# Patient Record
Sex: Female | Born: 1975
Health system: Southern US, Community
[De-identification: ages and names within clinical notes are randomized; demographics above are authoritative.]

## PROBLEM LIST (undated history)

## (undated) DIAGNOSIS — R7401 Elevation of levels of liver transaminase levels: Secondary | ICD-10-CM

## (undated) DIAGNOSIS — F17200 Nicotine dependence, unspecified, uncomplicated: Secondary | ICD-10-CM

## (undated) DIAGNOSIS — I10 Essential (primary) hypertension: Secondary | ICD-10-CM

## (undated) DIAGNOSIS — R7301 Impaired fasting glucose: Secondary | ICD-10-CM

## (undated) DIAGNOSIS — F419 Anxiety disorder, unspecified: Secondary | ICD-10-CM

## (undated) DIAGNOSIS — S93402A Sprain of unspecified ligament of left ankle, initial encounter: Secondary | ICD-10-CM

## (undated) DIAGNOSIS — G621 Alcoholic polyneuropathy: Secondary | ICD-10-CM

## (undated) DIAGNOSIS — R569 Unspecified convulsions: Secondary | ICD-10-CM

## (undated) DIAGNOSIS — F10939 Alcohol use, unspecified with withdrawal, unspecified: Secondary | ICD-10-CM

## (undated) DIAGNOSIS — F1020 Alcohol dependence, uncomplicated: Secondary | ICD-10-CM

## (undated) DIAGNOSIS — R740 Nonspecific elevation of levels of transaminase and lactic acid dehydrogenase [LDH]: Secondary | ICD-10-CM

## (undated) DIAGNOSIS — E519 Thiamine deficiency, unspecified: Secondary | ICD-10-CM

## (undated) DIAGNOSIS — E612 Magnesium deficiency: Secondary | ICD-10-CM

## (undated) HISTORY — DX: Anxiety disorder, unspecified: F41.9

## (undated) HISTORY — DX: Nicotine dependence, unspecified, uncomplicated: F17.200

## (undated) HISTORY — DX: Nonspecific elevation of levels of transaminase and lactic acid dehydrogenase (ldh): R74.0

## (undated) HISTORY — DX: Unspecified convulsions: R56.9

## (undated) HISTORY — DX: Alcohol use, unspecified with withdrawal, unspecified: F10.939

## (undated) HISTORY — DX: Essential (primary) hypertension: I10

## (undated) HISTORY — DX: Thiamine deficiency, unspecified: E51.9

## (undated) HISTORY — DX: Elevation of levels of liver transaminase levels: R74.01

## (undated) HISTORY — DX: Alcoholic polyneuropathy: G62.1

## (undated) HISTORY — PX: CATARACT EXTRACTION W/ INTRAOCULAR LENS  IMPLANT, BILATERAL: SHX1307

## (undated) HISTORY — DX: Magnesium deficiency: E61.2

## (undated) HISTORY — DX: Alcohol dependence, uncomplicated: F10.20

## (undated) HISTORY — PX: WISDOM TOOTH EXTRACTION: SHX21

## (undated) HISTORY — DX: Sprain of unspecified ligament of left ankle, initial encounter: S93.402A

## (undated) HISTORY — PX: CERVICAL CONE BIOPSY: SUR198

## (undated) HISTORY — DX: Impaired fasting glucose: R73.01

---

## 1999-11-01 ENCOUNTER — Other Ambulatory Visit: Admission: RE | Admit: 1999-11-01 | Discharge: 1999-11-01 | Payer: Self-pay | Admitting: Family Medicine

## 1999-12-03 ENCOUNTER — Other Ambulatory Visit: Admission: RE | Admit: 1999-12-03 | Discharge: 1999-12-03 | Payer: Self-pay | Admitting: Obstetrics and Gynecology

## 1999-12-03 ENCOUNTER — Encounter (INDEPENDENT_AMBULATORY_CARE_PROVIDER_SITE_OTHER): Payer: Self-pay | Admitting: Specialist

## 2000-04-28 ENCOUNTER — Other Ambulatory Visit: Admission: RE | Admit: 2000-04-28 | Discharge: 2000-04-28 | Payer: Self-pay | Admitting: Obstetrics and Gynecology

## 2000-12-23 ENCOUNTER — Other Ambulatory Visit: Admission: RE | Admit: 2000-12-23 | Discharge: 2000-12-23 | Payer: Self-pay | Admitting: Obstetrics and Gynecology

## 2001-11-11 ENCOUNTER — Other Ambulatory Visit: Admission: RE | Admit: 2001-11-11 | Discharge: 2001-11-11 | Payer: Self-pay | Admitting: Family Medicine

## 2002-08-19 ENCOUNTER — Encounter: Payer: Self-pay | Admitting: Internal Medicine

## 2002-08-19 ENCOUNTER — Encounter: Admission: RE | Admit: 2002-08-19 | Discharge: 2002-08-19 | Payer: Self-pay | Admitting: Internal Medicine

## 2002-09-28 ENCOUNTER — Encounter: Admission: RE | Admit: 2002-09-28 | Discharge: 2002-09-28 | Payer: Self-pay | Admitting: Internal Medicine

## 2002-09-28 ENCOUNTER — Encounter: Payer: Self-pay | Admitting: Internal Medicine

## 2003-06-17 ENCOUNTER — Inpatient Hospital Stay (HOSPITAL_COMMUNITY): Admission: AD | Admit: 2003-06-17 | Discharge: 2003-06-19 | Payer: Self-pay | Admitting: Obstetrics and Gynecology

## 2003-06-23 ENCOUNTER — Encounter: Admission: RE | Admit: 2003-06-23 | Discharge: 2003-07-23 | Payer: Self-pay | Admitting: Obstetrics and Gynecology

## 2003-08-02 ENCOUNTER — Other Ambulatory Visit: Admission: RE | Admit: 2003-08-02 | Discharge: 2003-08-02 | Payer: Self-pay | Admitting: Obstetrics and Gynecology

## 2004-08-14 ENCOUNTER — Other Ambulatory Visit: Admission: RE | Admit: 2004-08-14 | Discharge: 2004-08-14 | Payer: Self-pay | Admitting: Obstetrics and Gynecology

## 2004-12-03 ENCOUNTER — Ambulatory Visit: Payer: Self-pay | Admitting: Family Medicine

## 2005-02-14 ENCOUNTER — Ambulatory Visit: Payer: Self-pay | Admitting: Family Medicine

## 2005-02-28 ENCOUNTER — Ambulatory Visit: Payer: Self-pay

## 2005-03-04 ENCOUNTER — Encounter: Admission: RE | Admit: 2005-03-04 | Discharge: 2005-03-04 | Payer: Self-pay | Admitting: Obstetrics and Gynecology

## 2005-08-30 ENCOUNTER — Ambulatory Visit: Payer: Self-pay | Admitting: Internal Medicine

## 2005-10-30 ENCOUNTER — Ambulatory Visit: Payer: Self-pay | Admitting: Internal Medicine

## 2005-12-09 ENCOUNTER — Other Ambulatory Visit: Admission: RE | Admit: 2005-12-09 | Discharge: 2005-12-09 | Payer: Self-pay | Admitting: Obstetrics and Gynecology

## 2006-03-21 ENCOUNTER — Ambulatory Visit: Payer: Self-pay | Admitting: Family Medicine

## 2006-04-14 ENCOUNTER — Ambulatory Visit: Payer: Self-pay | Admitting: Family Medicine

## 2006-05-06 ENCOUNTER — Ambulatory Visit: Payer: Self-pay | Admitting: Internal Medicine

## 2006-05-20 ENCOUNTER — Ambulatory Visit: Payer: Self-pay | Admitting: Internal Medicine

## 2006-08-29 ENCOUNTER — Ambulatory Visit: Payer: Self-pay | Admitting: Internal Medicine

## 2006-08-29 LAB — CONVERTED CEMR LAB
BUN: 9 mg/dL (ref 6–23)
Creatinine, Ser: 0.8 mg/dL (ref 0.4–1.2)
Potassium: 4 meq/L (ref 3.5–5.1)

## 2008-02-16 ENCOUNTER — Telehealth (INDEPENDENT_AMBULATORY_CARE_PROVIDER_SITE_OTHER): Payer: Self-pay | Admitting: *Deleted

## 2008-05-19 ENCOUNTER — Telehealth (INDEPENDENT_AMBULATORY_CARE_PROVIDER_SITE_OTHER): Payer: Self-pay | Admitting: *Deleted

## 2008-06-28 ENCOUNTER — Ambulatory Visit: Payer: Self-pay | Admitting: Family Medicine

## 2008-06-28 DIAGNOSIS — J209 Acute bronchitis, unspecified: Secondary | ICD-10-CM | POA: Insufficient documentation

## 2008-06-29 ENCOUNTER — Telehealth (INDEPENDENT_AMBULATORY_CARE_PROVIDER_SITE_OTHER): Payer: Self-pay | Admitting: *Deleted

## 2008-07-18 ENCOUNTER — Telehealth (INDEPENDENT_AMBULATORY_CARE_PROVIDER_SITE_OTHER): Payer: Self-pay | Admitting: *Deleted

## 2008-07-20 ENCOUNTER — Ambulatory Visit: Payer: Self-pay | Admitting: Internal Medicine

## 2009-05-25 ENCOUNTER — Inpatient Hospital Stay (HOSPITAL_COMMUNITY): Admission: RE | Admit: 2009-05-25 | Discharge: 2009-05-26 | Payer: Self-pay | Admitting: Specialist

## 2009-05-30 ENCOUNTER — Inpatient Hospital Stay (HOSPITAL_COMMUNITY): Admission: AD | Admit: 2009-05-30 | Discharge: 2009-06-02 | Payer: Self-pay | Admitting: Obstetrics and Gynecology

## 2009-05-31 ENCOUNTER — Ambulatory Visit: Payer: Self-pay | Admitting: Obstetrics and Gynecology

## 2009-05-31 ENCOUNTER — Encounter (INDEPENDENT_AMBULATORY_CARE_PROVIDER_SITE_OTHER): Payer: Self-pay | Admitting: Obstetrics and Gynecology

## 2010-03-08 ENCOUNTER — Ambulatory Visit: Payer: Self-pay | Admitting: Family Medicine

## 2010-03-08 DIAGNOSIS — F17200 Nicotine dependence, unspecified, uncomplicated: Secondary | ICD-10-CM | POA: Insufficient documentation

## 2010-03-08 DIAGNOSIS — I10 Essential (primary) hypertension: Secondary | ICD-10-CM | POA: Insufficient documentation

## 2010-03-08 DIAGNOSIS — F411 Generalized anxiety disorder: Secondary | ICD-10-CM | POA: Insufficient documentation

## 2010-03-08 DIAGNOSIS — K589 Irritable bowel syndrome without diarrhea: Secondary | ICD-10-CM | POA: Insufficient documentation

## 2010-03-09 LAB — CONVERTED CEMR LAB
BUN: 15 mg/dL (ref 6–23)
Basophils Absolute: 0 10*3/uL (ref 0.0–0.1)
Basophils Relative: 0.3 % (ref 0.0–3.0)
CO2: 28 meq/L (ref 19–32)
Calcium: 9.2 mg/dL (ref 8.4–10.5)
Chloride: 99 meq/L (ref 96–112)
Creatinine, Ser: 0.7 mg/dL (ref 0.4–1.2)
Eosinophils Absolute: 0.2 10*3/uL (ref 0.0–0.7)
Eosinophils Relative: 1.4 % (ref 0.0–5.0)
GFR calc non Af Amer: 96.99 mL/min (ref >60)
Glucose, Bld: 88 mg/dL (ref 70–99)
HCT: 42 % (ref 36.0–46.0)
Hemoglobin: 14.3 g/dL (ref 12.0–15.0)
Lymphocytes Relative: 23 % (ref 12.0–46.0)
Lymphs Abs: 2.5 10*3/uL (ref 0.7–4.0)
MCHC: 34.1 g/dL (ref 30.0–36.0)
MCV: 97.5 fL (ref 78.0–100.0)
Monocytes Absolute: 0.6 10*3/uL (ref 0.1–1.0)
Monocytes Relative: 5.4 % (ref 3.0–12.0)
Neutro Abs: 7.7 10*3/uL (ref 1.4–7.7)
Neutrophils Relative %: 69.9 % (ref 43.0–77.0)
Platelets: 277 10*3/uL (ref 150.0–400.0)
Potassium: 4 meq/L (ref 3.5–5.1)
RBC: 4.31 M/uL (ref 3.87–5.11)
RDW: 12.7 % (ref 11.5–14.6)
Sodium: 136 meq/L (ref 135–145)
TSH: 0.91 microintl units/mL (ref 0.35–5.50)
WBC: 11 10*3/uL — ABNORMAL HIGH (ref 4.5–10.5)

## 2010-03-16 ENCOUNTER — Telehealth: Payer: Self-pay | Admitting: Family Medicine

## 2010-03-20 ENCOUNTER — Ambulatory Visit: Payer: Self-pay | Admitting: Family Medicine

## 2010-03-20 DIAGNOSIS — J310 Chronic rhinitis: Secondary | ICD-10-CM | POA: Insufficient documentation

## 2010-04-20 ENCOUNTER — Telehealth (INDEPENDENT_AMBULATORY_CARE_PROVIDER_SITE_OTHER): Payer: Self-pay | Admitting: *Deleted

## 2010-05-09 ENCOUNTER — Telehealth (INDEPENDENT_AMBULATORY_CARE_PROVIDER_SITE_OTHER): Payer: Self-pay | Admitting: *Deleted

## 2010-11-04 ENCOUNTER — Encounter: Payer: Self-pay | Admitting: Family Medicine

## 2010-11-15 NOTE — Assessment & Plan Note (Signed)
Summary: follow up on BP/drb   Vital Signs:  Patient profile:   35 year old female Weight:      132 pounds Pulse rate:   78 / minute BP sitting:   130 / 84  (left arm)  Vitals Entered By: Michelle Myers (March 20, 2010 9:26 AM) CC: bp f/u    History of Present Illness: 35 yo woman here today for  1) BP- much improved.  had mother-in-law's home health nurse check her BP over the weekend and BP was normal.  pt reports on 2 episodes would have racing heart, chest heaviness, shortness of breath- these improved spontaneously.  hx of panic attacks in the past.    2) Afrin addiction- pt reports she started using during pregnancy and has been using regularly for 'months'.  now 'unable to breathe w/out it'.  doesn't know what to do.  Allergies (verified): No Known Drug Allergies  Past History:  Past Medical History: HTN anxiety  Review of Systems      See HPI  Physical Exam  General:  Well-developed,well-nourished,in no acute distress; alert,appropriate and cooperative throughout examination.  anxious Nose:  + congestion Lungs:  Normal respiratory effort, chest expands symmetrically. Lungs are clear to auscultation, no crackles or wheezes. Heart:  regular S1/S2 Pulses:  +2 carotid, radial, DP/PT Extremities:  no C/C/E   Impression & Recommendations:  Problem # 1:  HYPERTENSION (ICD-401.9) Assessment Improved BP better controlled than last visit.  has renal US scheduled for tomorrow.  advised pt to keep appt despite better control of BP.  given pt's chest heaviness and palpitations did EKG- normal.  most likely a panic attack.  reviewed red flags that should prompt return. Her updated medication list for this problem includes:    Lisinopril-hydrochlorothiazide 20-12.5 Mg Tabs (Lisinopril-hydrochlorothiazide) .Marland Kitchen... 1 tablet by mouth daily  Orders: EKG w/ Interpretation (93000)  Problem # 2:  ANXIETY STATE, UNSPECIFIED (ICD-300.00) Assessment: Unchanged pt has had 2 episodes  of chest heaviness that sound consistent w/ panic attacks.  pt reports hx of this and 'i just deal w/ it'.  will follow. Her updated medication list for this problem includes:    Sertraline Hcl 100 Mg Tabs (Sertraline hcl) .Marland Kitchen... Take one tablet daily  Problem # 3:  RHINITIS MEDICAMENTOSA (ICD-472.0) Assessment: New stop afrin.  start steroid nasal spray.  Zyrtec daily.  try and avoid sudafed due to impact on BP.  Complete Medication List: 1)  Hydrocodone-acetaminophen 5-500 Mg Tabs (Hydrocodone-acetaminophen) .Marland Kitchen.. 1-2 tablets every 4 to 6 hours as needed 2)  Sertraline Hcl 100 Mg Tabs (Sertraline hcl) .... Take one tablet daily 3)  Camila 0.35 Mg Tabs (Norethindrone) .... Take one tablet daily 4)  Lisinopril-hydrochlorothiazide 20-12.5 Mg Tabs (Lisinopril-hydrochlorothiazide) .Marland Kitchen.. 1 tablet by mouth daily 5)  Bentyl 20 Mg Tabs (Dicyclomine hcl) .Marland Kitchen.. 1 tab by mouth three times a day as needed for abdominal spasm 6)  Nasonex 50 Mcg/act Susp (Mometasone furoate) .... 2 sprays each nostril once daily  Patient Instructions: 1)  Please schedule a complete physical at your convenience in the next 1-2 months.  Do not eat before this appt 2)  Continue the Lisinopril/HCTZ 3)  STOP the Afrin 4)  START the Nasonex- 2 sprays each nostril daily 5)  Start Zyrtec (over the counter) daily for allergies 6)  Limit the use of sudafed for only severe congestion b/c this will increase your blood pressure 7)  Hang in there! Prescriptions: NASONEX 50 MCG/ACT SUSP (MOMETASONE FUROATE) 2 sprays each nostril  once daily  #1 x 3   Entered and Authorized by:   Michelle Rhymes MD   Signed by:   Michelle Rhymes MD on 03/20/2010   Method used:   [facility] to        [facility]  [address] [phone]* (retail)       [address]       [place], [place]  [address]       Ph: [phone] or [phone]       Fax: [phone]   RxID:   [id number]

## 2010-11-15 NOTE — Assessment & Plan Note (Signed)
Summary: check bp & ibs/cbs   Vital Signs:  Patient profile:   35 year old female Weight:      129 pounds Pulse rate:   102 / minute BP sitting:   180 / 100  (left arm)  Vitals Entered By: Doristine Devoid (Mar 08, 2010 11:12 AM) CC: bp elevated and IBS concerns since being pregnant   History of Present Illness: 35 yo woman here today for  1) Elevated BP- first dx'd in college, strong family hx (parents and brother).  BP worsened after birth of 2nd baby.  has never had renal [place].  having associated lethargy, some feelings of shakiness.  has not had recent thyroid labs.  on methyldopa and HCTZ but pt can't remember to take methyldopa three times a day.  denies CP, SOB, HAs, visual changes, edema.  + tobacco.  2) IBS- diarrhea predominant.  every morning w/in 40 minutes of waking up has loose stools.  will have intermittant issues during the day.  some relief w/ immodium.  diet is not high in fiber.  having occasional abdominal spasms.  3) Anxiety- youngest daughter possibly has neurofibromatosis, admits to anxiety.  not on currently on Effexor, was previously.  now on Sertraline.  Preventive Screening-Counseling & Management  Alcohol-Tobacco     Smoking Status: current     Smoking Cessation Counseling: yes     Smoke Cessation Stage: ready     Packs/Day: 0.25  Caffeine-Diet-Exercise     Does Patient Exercise: yes      Sexual History:  currently monogamous.        Drug Use:  never.    Current Medications (verified): 1)  Hydrocodone-Acetaminophen 5-500 Mg Tabs (Hydrocodone-Acetaminophen) .Marland Kitchen.. 1-2 Tablets Every 4 To 6 Hours As Needed 2)  Methyldopa 250 Mg Tabs (Methyldopa) .... Take One Tablet Three Times A Day 3)  Hydrochlorothiazide 50 Mg Tabs (Hydrochlorothiazide) .... Take One Tablet Daily 4)  Sertraline Hcl 100 Mg Tabs (Sertraline Hcl) .... Take One Tablet Daily 5)  Camila 0.35 Mg Tabs (Norethindrone) .... Take One Tablet Daily 6)  Lisinopril-Hydrochlorothiazide 20-12.5 Mg  Tabs (Lisinopril-Hydrochlorothiazide) .Marland Kitchen.. 1 Tablet By Mouth Daily 7)  Bentyl 20 Mg Tabs (Dicyclomine Hcl) .Marland Kitchen.. 1 Tab By Mouth Three Times A Day As Needed For Abdominal Spasm  Allergies (verified): No Known Drug Allergies  Past History:  Past Medical History: HTN  Family History: HTN- mother, father, brother  Social History: Married Current Smoker 2 daughters- youngest w/ ? neurofibromatosis Packs/Day:  0.25 Does Patient Exercise:  yes Sexual History:  currently monogamous Drug Use:  never  Review of Systems      See HPI  Physical Exam  General:  Well-developed,well-nourished,in no acute distress; alert,appropriate and cooperative throughout examination.  anxious Head:  Normocephalic and atraumatic without obvious abnormalities. No apparent alopecia or balding. Eyes:  PERRL, EOMI Neck:  No deformities, masses, or tenderness noted. Lungs:  Normal respiratory effort, chest expands symmetrically. Lungs are clear to auscultation, no crackles or wheezes. Heart:  regular S1/S2, tachy Pulses:  +2 carotid, radial, DP/PT Extremities:  no C/C/E Psych:  anxious, intermittantly tearful   Impression & Recommendations:  Problem # 1:  HYPERTENSION (ICD-401.9) Assessment New pt's BP very high.  given young age at dx will get renal US.  stop Methyldopa and HCTZ and start ACE/HCTZ combo.  reviewed red flags that should prompt immediate attention.  Pt expresses understanding and is in agreement w/ this plan. The following medications were removed from the medication list:    Diltzac  360 Mg Xr24h-cap (Diltiazem hcl er beads) .Marland Kitchen... Take 1 capsule once daily    Methyldopa 250 Mg Tabs (Methyldopa) .Marland Kitchen... Take one tablet three times a day    Hydrochlorothiazide 50 Mg Tabs (Hydrochlorothiazide) .Marland Kitchen... Take one tablet daily Her updated medication list for this problem includes:    Lisinopril-hydrochlorothiazide 20-12.5 Mg Tabs (Lisinopril-hydrochlorothiazide) .Marland Kitchen... 1 tablet by mouth  daily  Orders: Venipuncture (16109) TLB-BMP (Basic Metabolic Panel-BMET) (80048-METABOL) TLB-CBC Platelet - w/Differential (85025-CBCD) TLB-TSH (Thyroid Stimulating Hormone) (60454-UJW) Radiology Referral (Radiology)  Problem # 2:  IBS (ICD-564.1) Assessment: New pt's sxs consistent w/ IBS.  reviewed lifestyle modifications- increased fiber, water, exercise.  bentyl as needed.  Problem # 3:  ANXIETY STATE, UNSPECIFIED (ICD-300.00) Assessment: New advised pt to try and find stress outlet- suggested exercise- as this will improve BP.  will discuss possibility of starting therapy at next visit. The following medications were removed from the medication list:    Effexor Xr 150 Mg Xr24h-cap (Venlafaxine hcl) .Marland Kitchen... Take 1 tab once daily Her updated medication list for this problem includes:    Sertraline Hcl 100 Mg Tabs (Sertraline hcl) .Marland Kitchen... Take one tablet daily  Problem # 4:  TOBACCO USE (ICD-305.1) Assessment: New encouraged pt to quit smoking as this will improve anxiety and HTN.  pt going to try.  Complete Medication List: 1)  Hydrocodone-acetaminophen 5-500 Mg Tabs (Hydrocodone-acetaminophen) .Marland Kitchen.. 1-2 tablets every 4 to 6 hours as needed 2)  Sertraline Hcl 100 Mg Tabs (Sertraline hcl) .... Take one tablet daily 3)  Camila 0.35 Mg Tabs (Norethindrone) .... Take one tablet daily 4)  Lisinopril-hydrochlorothiazide 20-12.5 Mg Tabs (Lisinopril-hydrochlorothiazide) .Marland Kitchen.. 1 tablet by mouth daily 5)  Bentyl 20 Mg Tabs (Dicyclomine hcl) .Marland Kitchen.. 1 tab by mouth three times a day as needed for abdominal spasm  Patient Instructions: 1)  Please schedule follow up in 2-3 weeks to recheck your blood pressure 2)  STOP the methyldopa and hydrochlorothiazide 3)  START the lisinopril-HCTZ combo pill- 1 daily 4)  Try and stay calm- anxiety will increase your blood pressure 5)  Limit the amount of salt in your diet 6)  STOP SMOKING!! 7)  Someone will call you with the renal ultrasound appt 8)  Use the  Bentyl as needed for abdominal spasm 9)  Increase the amount of fiber in your diet- either in the form of fruits and vegetables or in fiber supplements 10)  If you develop chest pain, shortness of breath, severe headaches, visual changes or other concerns- please go to the ER 11)  Hang in there!! Prescriptions: BENTYL 20 MG TABS (DICYCLOMINE HCL) 1 tab by mouth three times a day as needed for abdominal spasm  #60 x 1   Entered and Authorized by:   Neena Rhymes MD   Signed by:   Neena Rhymes MD on 03/08/2010   Method used:   [facility]        [facility]  [address] [phone]* (retail)       [address] [place], [place]  [address]       Ph: [phone] or [phone]       Fax: [phone]   RxID:   [id number] LISINOPRIL-HYDROCHLOROTHIAZIDE 20-12.5 MG TABS (LISINOPRIL-HYDROCHLOROTHIAZIDE) 1 tablet by mouth daily  #30 x 3   Entered and Authorized by:   Neena Rhymes MD   Signed by:   Neena Rhymes MD on 03/08/2010   Method used:   [facility]  [facility]  [address] [phone][facility] (retail)       [address]       [place], [place]  [address]       Ph: [phone] or [phone]       Fax: [phone]   RxID:   [id number]

## 2010-11-15 NOTE — Progress Notes (Signed)
Summary: BP meds/weight gain  Phone Note Call from Patient   Caller: Patient Summary of Call: Pt called and stated that since starting her new BP medication she has gained 8 pounds. She states that she is eating the same and excersing the same. Pt denies any other symptoms or changes. Please advise, pt would like to go back to old medication or change to a different medication. Army Fossa CMA  March 16, 2010 2:00 PM    Follow-up for Phone Call        weight gain is not a side effect of Lisinopril HCTZ (or I can't find any data on this).  up to 5 lbs in water weight fluctuations is within normal limits.  given how high her blood pressure was and the fact that her old medicine wasn't working she needs to continue on this at least through the weekend and can schedule an appt for early next week.  please ask her what her BP has been running. Follow-up by: Neena Rhymes MD,  March 16, 2010 2:13 PM  Additional Follow-up for Phone Call Additional follow up Details #1::        Pt has not checked BP, will check. She has an appt on Tuesday. She will continue meds through the weekend. Army Fossa CMA  March 16, 2010 2:20 PM

## 2010-11-15 NOTE — Progress Notes (Signed)
Summary: hydrocodone refill   Phone Note Refill Request Message from:  Fax from Pharmacy on April 20, 2010 9:44 AM  Refills Requested: Medication #1:  HYDROCODONE-ACETAMINOPHEN 5-500 MG TABS 1-2 tablets every 4 to 6 hours as needed [facility] [address] [facility] [place] - fax [phone] - tel [phone]  Initial call taken by: Okey Regal [place],  April 20, 2010 9:45 AM  Follow-up for Phone Call        called patient to verify what she is taking medication for says she was given this for HA's ok to fill?.......Marland KitchenDoristine Devoid  April 20, 2010 4:01 PM   Additional Follow-up for Phone Call Additional follow up Details #1::        ok for #20, no refills Additional Follow-up by: Neena Rhymes MD,  April 20, 2010 4:27 PM    Prescriptions: HYDROCODONE-ACETAMINOPHEN 5-500 MG TABS (HYDROCODONE-ACETAMINOPHEN) 1-2 tablets every 4 to 6 hours as needed  #20 x 0   Entered by:   Doristine Devoid   Authorized by:   Neena Rhymes MD   Signed by:   Doristine Devoid on 04/20/2010   Method used:   Printed then faxed to ...       [facility]  [address] [phone][facility] ([facility]ail)       [address]       [place], [place]  [address]       Ph: [phone] or [phone]       Fax: [phone]   RxID:   [id number]

## 2010-11-15 NOTE — Progress Notes (Signed)
Summary: hydrocodone refill denied   Phone Note Refill Request Message from:  Pharmacy on May 09, 2010 10:28 AM  Refills Requested: Medication #1:  HYDROCODONE-ACETAMINOPHEN 5-500 MG TABS 1-2 tablets every 4 to 6 hours as needed   Dosage confirmed as above?Dosage Confirmed   Supply Requested: 1 month   Last Refilled: 04/20/[facility] [address]  Next Appointment Scheduled: August 2nd 2011 Initial call taken by: Lavell Islam,  May 09, 2010 10:29 AM  Follow-up for Phone Call        she should not be taking these regularly.  only as needed for severe headaches.  no refill at this time.  if HAs are severe enough to require 20 vicodin monthly she will need an OV Follow-up by: Neena Rhymes MD,  May 09, 2010 3:56 PM  Additional Follow-up for Phone Call Additional follow up Details #1::        spoke w/ patient aware of instructions and has appt scheduled for cpx next week and says she will address this issue at that time........Marland KitchenDoristine Devoid CMA  May 09, 2010 4:08 PM

## 2011-01-19 LAB — COMPREHENSIVE METABOLIC PANEL
ALT: 11 U/L (ref 0–35)
AST: 22 U/L (ref 0–37)
Alkaline Phosphatase: 96 U/L (ref 39–117)
CO2: 21 mEq/L (ref 19–32)
Chloride: 106 mEq/L (ref 96–112)
Creatinine, Ser: 0.5 mg/dL (ref 0.4–1.2)
GFR calc Af Amer: >60 mL/min (ref >60)
GFR calc non Af Amer: >60 mL/min (ref >60)
Potassium: 3.6 mEq/L (ref 3.5–5.1)
Sodium: 134 mEq/L — ABNORMAL LOW (ref 135–145)
Total Bilirubin: 0.4 mg/dL (ref 0.3–1.2)

## 2011-01-19 LAB — CBC
HCT: 34 % — ABNORMAL LOW (ref 36.0–46.0)
Hemoglobin: 11.9 g/dL — ABNORMAL LOW (ref 12.0–15.0)
MCHC: 34.5 g/dL (ref 30.0–36.0)
MCHC: 35 g/dL (ref 30.0–36.0)
MCV: 96.9 fL (ref 78.0–100.0)
MCV: 98.1 fL (ref 78.0–100.0)
Platelets: 184 10*3/uL (ref 150–400)
Platelets: 242 10*3/uL (ref 150–400)
RBC: 3.5 MIL/uL — ABNORMAL LOW (ref 3.87–5.11)
RDW: 12.2 % (ref 11.5–15.5)
RDW: 12.3 % (ref 11.5–15.5)
WBC: 13.4 10*3/uL — ABNORMAL HIGH (ref 4.0–10.5)

## 2011-10-28 ENCOUNTER — Other Ambulatory Visit: Payer: Self-pay | Admitting: Family Medicine

## 2011-10-28 ENCOUNTER — Encounter (INDEPENDENT_AMBULATORY_CARE_PROVIDER_SITE_OTHER): Payer: BC Managed Care – PPO | Admitting: Cardiology

## 2011-10-28 ENCOUNTER — Ambulatory Visit (INDEPENDENT_AMBULATORY_CARE_PROVIDER_SITE_OTHER): Payer: BC Managed Care – PPO | Admitting: Family Medicine

## 2011-10-28 ENCOUNTER — Other Ambulatory Visit: Payer: Self-pay | Admitting: Cardiology

## 2011-10-28 ENCOUNTER — Encounter: Payer: Self-pay | Admitting: Family Medicine

## 2011-10-28 VITALS — BP 152/92 | HR 85 | Temp 98.9°F | Wt 151.0 lb

## 2011-10-28 DIAGNOSIS — M79609 Pain in unspecified limb: Secondary | ICD-10-CM

## 2011-10-28 DIAGNOSIS — Z720 Tobacco use: Secondary | ICD-10-CM

## 2011-10-28 DIAGNOSIS — F17200 Nicotine dependence, unspecified, uncomplicated: Secondary | ICD-10-CM

## 2011-10-28 DIAGNOSIS — M79669 Pain in unspecified lower leg: Secondary | ICD-10-CM

## 2011-10-28 DIAGNOSIS — I10 Essential (primary) hypertension: Secondary | ICD-10-CM

## 2011-10-28 DIAGNOSIS — R229 Localized swelling, mass and lump, unspecified: Secondary | ICD-10-CM

## 2011-10-28 DIAGNOSIS — M7989 Other specified soft tissue disorders: Secondary | ICD-10-CM

## 2011-10-28 DIAGNOSIS — M7120 Synovial cyst of popliteal space [Baker], unspecified knee: Secondary | ICD-10-CM

## 2011-10-28 MED ORDER — LISINOPRIL-HYDROCHLOROTHIAZIDE 20-12.5 MG PO TABS: 2.0000 | ORAL_TABLET | Freq: Every day | ORAL | Status: DC

## 2011-10-28 MED ORDER — VARENICLINE TARTRATE 0.5 MG X 11 & 1 MG X 42 PO MISC: ORAL | Status: AC

## 2011-10-28 NOTE — Patient Instructions (Signed)
Smoking Cessation This document explains the best ways for you to quit smoking and new treatments to help. It lists new medicines that can double or triple your chances of quitting and quitting for good. It also considers ways to avoid relapses and concerns you may have about quitting, including weight gain. NICOTINE: A POWERFUL ADDICTION If you have tried to quit smoking, you know how hard it can be. It is hard because nicotine is a very addictive drug. For some people, it can be as addictive as heroin or cocaine. Usually, people make 2 or 3 tries, or more, before finally being able to quit. Each time you try to quit, you can learn about what helps and what hurts. Quitting takes hard work and a lot of effort, but you can quit smoking. QUITTING SMOKING IS ONE OF THE MOST IMPORTANT THINGS YOU WILL EVER DO.  You will live longer, feel better, and live better.   The impact on your body of quitting smoking is felt almost immediately:   Within 20 minutes, blood pressure decreases. Pulse returns to its normal level.   After 8 hours, carbon monoxide levels in the blood return to normal. Oxygen level increases.   After 24 hours, chance of heart attack starts to decrease. Breath, hair, and body stop smelling like smoke.   After 48 hours, damaged nerve endings begin to recover. Sense of taste and smell improve.   After 72 hours, the body is virtually free of nicotine. Bronchial tubes relax and breathing becomes easier.   After 2 to 12 weeks, lungs can hold more air. Exercise becomes easier and circulation improves.   Quitting will reduce your risk of having a heart attack, stroke, cancer, or lung disease:   After 1 year, the risk of coronary heart disease is cut in half.   After 5 years, the risk of stroke falls to the same as a nonsmoker.   After 10 years, the risk of lung cancer is cut in half and the risk of other cancers decreases significantly.   After 15 years, the risk of coronary heart  disease drops, usually to the level of a nonsmoker.   If you are pregnant, quitting smoking will improve your chances of having a healthy baby.   The people you live with, especially your children, will be healthier.   You will have extra money to spend on things other than cigarettes.  FIVE KEYS TO QUITTING Studies have shown that these 5 steps will help you quit smoking and quit for good. You have the best chances of quitting if you use them together: 1. Get ready.  2. Get support and encouragement.  3. Learn new skills and behaviors.  4. Get medicine to reduce your nicotine addiction and use it correctly.  5. Be prepared for relapse or difficult situations. Be determined to continue trying to quit, even if you do not succeed at first.  1. GET READY  Set a quit date.   Change your environment.   Get rid of ALL cigarettes, ashtrays, matches, and lighters in your home, car, and place of work.   Do not let people smoke in your home.   Review your past attempts to quit. Think about what worked and what did not.   Once you quit, do not smoke. NOT EVEN A PUFF!  2. GET SUPPORT AND ENCOURAGEMENT Studies have shown that you have a better chance of being successful if you have help. You can get support in many ways.  Tell   your family, friends, and coworkers that you are going to quit and need their support. Ask them not to smoke around you.   Talk to your caregivers (doctor, dentist, nurse, pharmacist, psychologist, and/or smoking counselor).   Get individual, group, or telephone counseling and support. The more counseling you have, the better your chances are of quitting. Programs are available at local hospitals and health centers. Call your local health department for information about programs in your area.   Spiritual beliefs and practices may help some smokers quit.   Quit meters are small computer programs online or downloadable that keep track of quit statistics, such as amount  of "quit-time," cigarettes not smoked, and money saved.   Many smokers find one or more of the many self-help books available useful in helping them quit and stay off tobacco.  3. LEARN NEW SKILLS AND BEHAVIORS  Try to distract yourself from urges to smoke. Talk to someone, go for a walk, or occupy your time with a task.   When you first try to quit, change your routine. Take a different route to work. Drink tea instead of coffee. Eat breakfast in a different place.   Do something to reduce your stress. Take a hot bath, exercise, or read a book.   Plan something enjoyable to do every day. Reward yourself for not smoking.   Explore interactive web-based programs that specialize in helping you quit.  4. GET MEDICINE AND USE IT CORRECTLY Medicines can help you stop smoking and decrease the urge to smoke. Combining medicine with the above behavioral methods and support can quadruple your chances of successfully quitting smoking. The [place] Food and Drug Administration (FDA) has approved 7 medicines to help you quit smoking. These medicines fall into 3 categories.  Nicotine replacement therapy (delivers nicotine to your body without the negative effects and risks of smoking):   Nicotine gum: Available over-the-counter.   Nicotine lozenges: Available over-the-counter.   Nicotine inhaler: Available by prescription.   Nicotine nasal spray: Available by prescription.   Nicotine skin patches (transdermal): Available by prescription and over-the-counter.   Antidepressant medicine (helps people abstain from smoking, but how this works is unknown):   Bupropion sustained-release (SR) tablets: Available by prescription.   Nicotinic receptor partial agonist (simulates the effect of nicotine in your brain):   Varenicline tartrate tablets: Available by prescription.   Ask your caregiver for advice about which medicines to use and how to use them. Carefully read the information on the package.    Everyone who is trying to quit may benefit from using a medicine. If you are pregnant or trying to become pregnant, nursing an infant, you are under age 18, or you smoke fewer than 10 cigarettes per day, talk to your caregiver before taking any nicotine replacement medicines.   You should stop using a nicotine replacement product and call your caregiver if you experience nausea, dizziness, weakness, vomiting, fast or irregular heartbeat, mouth problems with the lozenge or gum, or redness or swelling of the skin around the patch that does not go away.   Do not use any other product containing nicotine while using a nicotine replacement product.   Talk to your caregiver before using these products if you have diabetes, heart disease, asthma, stomach ulcers, you had a recent heart attack, you have high blood pressure that is not controlled with medicine, a history of irregular heartbeat, or you have been prescribed medicine to help you quit smoking.  5. BE PREPARED FOR RELAPSE OR   DIFFICULT SITUATIONS  Most relapses occur within the first 3 months after quitting. Do not be discouraged if you start smoking again. Remember, most people try several times before they finally quit.   You may have symptoms of withdrawal because your body is used to nicotine. You may crave cigarettes, be irritable, feel very hungry, cough often, get headaches, or have difficulty concentrating.   The withdrawal symptoms are only temporary. They are strongest when you first quit, but they will go away within 10 to 14 days.  Here are some difficult situations to watch for:  Alcohol. Avoid drinking alcohol. Drinking lowers your chances of successfully quitting.   Caffeine. Try to reduce the amount of caffeine you consume. It also lowers your chances of successfully quitting.   Other smokers. Being around smoking can make you want to smoke. Avoid smokers.   Weight gain. Many smokers will gain weight when they quit, usually  less than 10 pounds. Eat a healthy diet and stay active. Do not let weight gain distract you from your main goal, quitting smoking. Some medicines that help you quit smoking may also help delay weight gain. You can always lose the weight gained after you quit.   Bad mood or depression. There are a lot of ways to improve your mood other than smoking.  If you are having problems with any of these situations, talk to your caregiver. SPECIAL SITUATIONS AND CONDITIONS Studies suggest that everyone can quit smoking. Your situation or condition can give you a special reason to quit.  Pregnant women/new mothers: By quitting, you protect your baby's health and your own.   Hospitalized patients: By quitting, you reduce health problems and help healing.   Heart attack patients: By quitting, you reduce your risk of a second heart attack.   Lung, head, and neck cancer patients: By quitting, you reduce your chance of a second cancer.   Parents of children and adolescents: By quitting, you protect your children from illnesses caused by secondhand smoke.  QUESTIONS TO THINK ABOUT Think about the following questions before you try to stop smoking. You may want to talk about your answers with your caregiver.  Why do you want to quit?   If you tried to quit in the past, what helped and what did not?   What will be the most difficult situations for you after you quit? How will you plan to handle them?   Who can help you through the tough times? Your family? Friends? Caregiver?   What pleasures do you get from smoking? What ways can you still get pleasure if you quit?  Here are some questions to ask your caregiver:  How can you help me to be successful at quitting?   What medicine do you think would be best for me and how should I take it?   What should I do if I need more help?   What is smoking withdrawal like? How can I get information on withdrawal?  Quitting takes hard work and a lot of effort,  but you can quit smoking. FOR MORE INFORMATION  Smokefree.gov (http://www.smokefree.gov) provides free, accurate, evidence-based information and professional assistance to help support the immediate and long-term needs of people trying to quit smoking. Document Released: 09/24/2001 Document Revised: 06/12/2011 Document Reviewed: 07/17/2009 [employer] Patient Information 2012 [employer]. 

## 2011-10-28 NOTE — Progress Notes (Signed)
  Subjective:    Patient ID: Michelle Myers, female    DOB: 06/10/1976, 36 y.o.   MRN: [id number]  HPI Pt here after not being seen for a long time.  She c/o calf pain and a know behind knee cap for several days.   No sob or chest pain.   Review of Systems    as above Objective:   Physical Exam  Constitutional: She is oriented to person, place, and time. She appears well-developed and well-nourished.  Pulmonary/Chest: Effort normal and breath sounds normal.  Musculoskeletal: She exhibits tenderness. She exhibits no edema.       L calf tenderness with palpable mass behind knee No errythema or swelling  Neurological: She is alert and oriented to person, place, and time.  Psychiatric: She has a normal mood and affect. Her behavior is normal. Judgment and thought content normal.          Assessment & Plan:  Calf pain---  Doppler today----  More likely bakers cyst Tob-- chantix starter pack htn--increase lisinopril 20/12.5   2 po qd                 rtp 2-3 weeks

## 2011-12-11 ENCOUNTER — Telehealth: Payer: Self-pay | Admitting: *Deleted

## 2011-12-11 NOTE — Telephone Encounter (Signed)
Call a nurse called to advise the pt has experienced rapid heart beat and lightheadedness for 20 minutes however she is no longer having these sxs, advised pt that we can see her however if her sxs she needs to go to the office, pt requested MD Alwyn Ren, advised he will be out of the office til march 11th, pt accepted apt via call a nurse karen, for MD Tabori at 9:30am 12-11-11, pt was also instructed to have someone else drive her.pt understood all instructions

## 2011-12-11 NOTE — Telephone Encounter (Signed)
Triage Call Report Triage Record Num: [id number] Operator: Patriciaann Clan Patient Name: Michelle Myers Call Date & Time: 12/11/2011 4:39:06PM Patient Phone: [phone] PCP: Marga Melnick Patient Gender: Female PCP Fax : [phone] Patient DOB: June 10, 1976 Practice Name: [facility] Reason for Call: Caller: Tara/Patient; PCP: Marga Melnick; CB#: [phone]; ; ; Call regarding Rapid Heart Rate, lightheaded. LMP 11/25/11. Patient states she developed sudden onset of rapid heart rate, feeling lightheaded. Onset 12/11/11 1100. States episode occurred while sitting at rest. Denies nausea, chest pain, shortness of breath or diaphoresis. States episode lasted approx. 20 minutes. Afebrile. States had similar episode approx. 10 days ago. Patient states she is asymptomatic at present. Triage per Irregular Heartbeat Protocol. Care advice given per guidelines. Patient advised to increase fluids, avoid caffiene. Call back parameters reviewed. Patient verbalizes understanding. RN spoke with Selena Batten RN in office regarding ED Disposition. Orders received: Patient to be seen in office 12/12/11. Patient to go to ED if sx recur. Patient informed of above, verbalizes understanding. Call transferred to Chrae in office for appt. 12/12/11. Protocol(s) Used: Irregular Heartbeat Recommended Outcome per Protocol: See ED Immediately Override Outcome if Used in Protocol: See Provider within 24 hours RN Reason for Override Outcome: Physician Orders. Reason for Outcome: Sudden onset of rapid pulse (greater than 120 beats/minute at rest) OR slow pulse (less than 50 beats per minute at rest) Care Advice: ~ Another adult should drive. 02/

## 2011-12-12 ENCOUNTER — Ambulatory Visit: Payer: BC Managed Care – PPO | Admitting: Family Medicine

## 2012-02-27 ENCOUNTER — Other Ambulatory Visit: Payer: Self-pay | Admitting: Family Medicine

## 2012-09-30 ENCOUNTER — Other Ambulatory Visit: Payer: Self-pay | Admitting: Obstetrics and Gynecology

## 2013-07-28 ENCOUNTER — Ambulatory Visit (INDEPENDENT_AMBULATORY_CARE_PROVIDER_SITE_OTHER): Payer: BC Managed Care – PPO | Admitting: Internal Medicine

## 2013-07-28 ENCOUNTER — Telehealth: Payer: Self-pay | Admitting: *Deleted

## 2013-07-28 ENCOUNTER — Encounter: Payer: Self-pay | Admitting: Internal Medicine

## 2013-07-28 VITALS — BP 145/89 | HR 86 | Temp 98.8°F | Resp 12 | Wt 144.6 lb

## 2013-07-28 DIAGNOSIS — R200 Anesthesia of skin: Secondary | ICD-10-CM

## 2013-07-28 DIAGNOSIS — R209 Unspecified disturbances of skin sensation: Secondary | ICD-10-CM

## 2013-07-28 DIAGNOSIS — F17210 Nicotine dependence, cigarettes, uncomplicated: Secondary | ICD-10-CM

## 2013-07-28 DIAGNOSIS — F17200 Nicotine dependence, unspecified, uncomplicated: Secondary | ICD-10-CM

## 2013-07-28 DIAGNOSIS — R202 Paresthesia of skin: Secondary | ICD-10-CM

## 2013-07-28 DIAGNOSIS — I10 Essential (primary) hypertension: Secondary | ICD-10-CM

## 2013-07-28 DIAGNOSIS — I7300 Raynaud's syndrome without gangrene: Secondary | ICD-10-CM

## 2013-07-28 LAB — VITAMIN B12: Vitamin B-12: 306 pg/mL (ref 211–911)

## 2013-07-28 MED ORDER — VARENICLINE TARTRATE 0.5 MG PO TABS: 0.5000 mg | ORAL_TABLET | Freq: Two times a day (BID) | ORAL | Status: DC

## 2013-07-28 NOTE — Progress Notes (Signed)
  Subjective:    Patient ID: Michelle Myers, female    DOB: [birth year], 37 y.o.   MRN: [id number]  HPI  For the last 4 months she's noted numbness in the balls of the feet and distally involving all toes of both feet. This only occurs with exercise such as using her elliptical machine.  She has had chronic tingling as "pins and needles" involving her hands and feet for several years.  There's been no treatment or evaluation    Review of Systems  She denies constitutional symptoms of fever, chills, sweats, weight loss.  She also denies limb weakness or incontinence of urine or stool.  Last letter she noted redness of both forearms and hands without specific trigger. This could occur indoors  Chantix to aid smoking cessation requested     Objective:   Physical Exam Appears healthy and well-nourished & in no acute distress  No carotid bruits are present.No neck pain distention present at 10 - 15 degrees. Thyroid normal to palpation  Heart rhythm and rate are normal with no significant murmurs or gallops.  Chest is clear with no increased work of breathing. Decreased BS  There is no evidence of aortic aneurysm or renal artery bruits  Abdomen soft with no organomegaly or masses. No HJR  No clubbing, cyanosis or edema present.  Pedal pulses are intact   No ischemic skin changes are present . Nails healthy    Alert and oriented. Strength, tone, DTRs reflexes normal. Light touch normal over feet & hands          Assessment & Plan:  #1 numbness of the feet from the balls to the distal toes; this occurred exercises most likely related to compression of the digital nerves. This may be related to her footwear  #2 chronic tingling in extremities; B12 and TSH levels recommended  #3 active smoker with desire to quit; Chantix will be prescribed if not contraindicated with a sertraline #4 Raynaud's

## 2013-07-28 NOTE — Patient Instructions (Signed)
Protect  hands, feet, and ears from cold exposure including ice chests. Use silk sock & mitten liners; this can be purchased at outdoor supply stores. Minimal Blood Pressure Goal= AVERAGE < 140/90;  Ideal is an AVERAGE < 135/85. This AVERAGE should be calculated from @ least 5-7 BP readings taken @ different times of day on different days of week. You should not respond to isolated BP readings , but rather the AVERAGE for that week .Please bring your  blood pressure cuff to office visits to verify that it is reliable.It  can also be checked against the blood pressure device at the pharmacy. Finger or wrist cuffs are not dependable; an arm cuff is.  If you activate the  My Chart system; lab & Xray results will be released directly  to you as soon as I review & address these through the computer. If you choose not to sign up for My Chart within 36 hours of labs being drawn; results will be reviewed & interpretation added before being copied & mailed, causing a delay in getting the results to you.If you do not receive that report within 7-10 days ,please call. Additionally you can use this system to gain direct  access to your records  if  out of town or @ an office of a  physician who is not in  the My Chart network.  This improves continuity of care & places you in control of your medical record.  I recommend a Sports Medicine consultation to determine optimal therapy; I recommend Dr Ayesha Mohair

## 2013-07-28 NOTE — Telephone Encounter (Signed)
Samples of Chantix given to patient during visit today. Lot # [id number], Exp 08/2015

## 2013-07-29 ENCOUNTER — Encounter: Payer: Self-pay | Admitting: *Deleted

## 2013-07-29 NOTE — Progress Notes (Signed)
Letter mailed to patient.

## 2013-09-28 ENCOUNTER — Telehealth: Payer: Self-pay

## 2013-09-28 NOTE — Telephone Encounter (Addendum)
Left message for call back identifiable  Medication and allergies:  Reviewed and updated  90 day supply/mail order:  Local pharmacy:    Immunizations due:  Unsure when had last tdap  A/P:   No changes to personal hx or PSH; updated FH Pap--09/2012--Dr Grewal Flu vaccine at work  To Discuss with Provider: Father recently dx with prostate cancer Youngest daughter recently dx with brain tumor; praying to be benign

## 2013-09-29 ENCOUNTER — Ambulatory Visit (INDEPENDENT_AMBULATORY_CARE_PROVIDER_SITE_OTHER): Payer: BC Managed Care – PPO | Admitting: Internal Medicine

## 2013-09-29 ENCOUNTER — Encounter: Payer: Self-pay | Admitting: Internal Medicine

## 2013-09-29 VITALS — BP 125/83 | HR 85 | Temp 98.5°F | Resp 13 | Ht 65.75 in | Wt 145.6 lb

## 2013-09-29 DIAGNOSIS — J310 Chronic rhinitis: Secondary | ICD-10-CM

## 2013-09-29 DIAGNOSIS — Z23 Encounter for immunization: Secondary | ICD-10-CM

## 2013-09-29 DIAGNOSIS — Z0000 Encounter for general adult medical examination without abnormal findings: Secondary | ICD-10-CM

## 2013-09-29 DIAGNOSIS — F17200 Nicotine dependence, unspecified, uncomplicated: Secondary | ICD-10-CM

## 2013-09-29 DIAGNOSIS — F411 Generalized anxiety disorder: Secondary | ICD-10-CM

## 2013-09-29 NOTE — Progress Notes (Signed)
Pre visit review using our clinic review tool, if applicable. No additional management support is needed unless otherwise documented below in the visit note. 

## 2013-09-29 NOTE — Progress Notes (Signed)
   Subjective:    Patient ID: Michelle Myers, female    DOB: 12/16/1975, 37 y.o.   MRN: [id number]  HPI  She is here for a physical;acute issues denied but ongoing neurotransmitter issues.     Review of Systems  She feels that Wellbutrin has been the most effective agent she's used for neurotransmitter deficiency. This had to be discontinued because of pregnancy. She is presently on a BCP& plans no additional pregnancies. Her sertraline does not have any significant impact on her procrastination and anxiety. She is drinking 1.5 bottles of wine & smoking 1.5 ppd.    Objective:   Physical Exam  Gen.: Healthy and well-nourished in appearance. Alert, appropriate and cooperative throughout exam. Appears younger than stated age  Head: Normocephalic without obvious abnormalities  Eyes: No corneal or conjunctival inflammation noted. Pupils equal round reactive to light and accommodation. Extraocular motion intact.  Ears: External  ear exam reveals no significant lesions or deformities. Canals clear .TMs normal.  Nose: External nasal exam reveals no deformity or inflammation. Nasal mucosa are pink and moist. No lesions or exudates noted.   Mouth: Oral mucosa and oropharynx reveal no lesions or exudates. Teeth in good repair. Neck: No deformities, masses, or tenderness noted.  Thyroid normal. Lungs: Normal respiratory effort; chest expands symmetrically. Low grade basilar  rales, wheezes, & rhonchi w/o increased work of breathing. Heart: Normal rate and rhythm. Normal S1 and S2. No gallop, click, or rub. S4 with slurring at  LSB Abdomen: Bowel sounds normal; abdomen soft and nontender. No masses, organomegaly or hernias noted. Genitalia:  as per Gyn                                  Musculoskeletal/extremities: No deformity or scoliosis noted of  the thoracic or lumbar spine.   No clubbing, cyanosis, edema, or significant extremity  deformity noted. Range of motion normal .Tone & strength  normal. Hand joints normal . Fingernail  health good. Able to lie down & sit up w/o help. Negative SLR bilaterally Vascular: Carotid, radial artery, dorsalis pedis and  posterior tibial pulses are full and equal. No bruits present. Neurologic: Alert and oriented x3. Deep tendon reflexes symmetrical and normal.        Skin: Intact without suspicious lesions or rashes. Lymph: No cervical, axillary lymphadenopathy present. Psych: Mood and affect are normal. Normally interactive                                                                                        Assessment & Plan:  #1 comprehensive physical exam; no acute findings #2 anxiety disorder & nicotine & alcohol excess  Plan: see Orders  & Recommendations

## 2013-09-29 NOTE — Patient Instructions (Signed)
Plain Mucinex (NOT D) for thick secretions ;force NON dairy fluids .   Nasal cleansing in the shower as discussed with lather of mild shampoo.After 10 seconds wash off lather while  exhaling through nostrils. Make sure that all residual soap is removed to prevent irritation.  Nasacort AQ OTC 1 spray in each nostril twice a day as needed. Use the "crossover" technique into opposite nostril spraying toward opposite ear @ 45 degree angle, not straight up into nostril.  Use a Neti pot daily only  as needed for significant sinus congestion; going from open side to congested side . Plain Allegra (NOT D )  160 daily , Loratidine 10 mg , OR Zyrtec 10 mg @ bedtime  as needed for itchy eyes & sneezing.   Please discuss with Dr Vincente Poli changing Sertraline back to Wellbutrin to raise the Dopamine  neurotransmitter which is essential for good brain function, both intellectual & emotional health. This agents is not addictive and simply keeps this essential neurotransmitter at therapeutic levels. If these levels become severely depleted; ADD & addictive behavior can present.

## 2013-09-29 NOTE — Addendum Note (Signed)
Addended by: Verdie Shire on: 09/29/2013 03:07 PM   Modules accepted: Orders

## 2013-10-08 ENCOUNTER — Other Ambulatory Visit: Payer: BC Managed Care – PPO

## 2013-10-08 ENCOUNTER — Other Ambulatory Visit (INDEPENDENT_AMBULATORY_CARE_PROVIDER_SITE_OTHER): Payer: BC Managed Care – PPO

## 2013-10-08 ENCOUNTER — Encounter: Payer: Self-pay | Admitting: *Deleted

## 2013-10-08 DIAGNOSIS — Z0000 Encounter for general adult medical examination without abnormal findings: Secondary | ICD-10-CM

## 2013-10-08 LAB — CBC WITH DIFFERENTIAL/PLATELET
Basophils Absolute: 0.1 10*3/uL (ref 0.0–0.1)
Eosinophils Relative: 3.5 % (ref 0.0–5.0)
Monocytes Absolute: 0.7 10*3/uL (ref 0.1–1.0)
Monocytes Relative: 6.5 % (ref 3.0–12.0)
Neutrophils Relative %: 69.4 % (ref 43.0–77.0)
Platelets: 284 10*3/uL (ref 150.0–400.0)
RDW: 12.7 % (ref 11.5–14.6)
WBC: 10.9 10*3/uL — ABNORMAL HIGH (ref 4.5–10.5)

## 2013-10-08 LAB — LIPID PANEL
Cholesterol: 169 mg/dL (ref 0–200)
LDL Cholesterol: 93 mg/dL (ref 0–99)
Total CHOL/HDL Ratio: 3
Triglycerides: 52 mg/dL (ref 0.0–149.0)
VLDL: 10.4 mg/dL (ref 0.0–40.0)

## 2013-10-08 LAB — HEPATIC FUNCTION PANEL
ALT: 17 U/L (ref 0–35)
AST: 23 U/L (ref 0–37)
Bilirubin, Direct: 0.1 mg/dL (ref 0.0–0.3)
Total Bilirubin: 0.4 mg/dL (ref 0.3–1.2)

## 2013-10-08 LAB — BASIC METABOLIC PANEL
BUN: 10 mg/dL (ref 6–23)
Creatinine, Ser: 0.5 mg/dL (ref 0.4–1.2)
GFR: 150.55 mL/min (ref >60.00)
Glucose, Bld: 86 mg/dL (ref 70–99)

## 2013-11-08 ENCOUNTER — Other Ambulatory Visit: Payer: Self-pay | Admitting: Obstetrics and Gynecology

## 2013-12-27 ENCOUNTER — Encounter (HOSPITAL_BASED_OUTPATIENT_CLINIC_OR_DEPARTMENT_OTHER): Payer: Self-pay | Admitting: Emergency Medicine

## 2013-12-27 ENCOUNTER — Telehealth: Payer: Self-pay | Admitting: *Deleted

## 2013-12-27 ENCOUNTER — Emergency Department (HOSPITAL_BASED_OUTPATIENT_CLINIC_OR_DEPARTMENT_OTHER): Payer: BC Managed Care – PPO

## 2013-12-27 ENCOUNTER — Emergency Department (HOSPITAL_BASED_OUTPATIENT_CLINIC_OR_DEPARTMENT_OTHER)
Admission: EM | Admit: 2013-12-27 | Discharge: 2013-12-27 | Disposition: A | Discharge status: Home / Self Care | Payer: BC Managed Care – PPO | Attending: Emergency Medicine | Admitting: Emergency Medicine

## 2013-12-27 DIAGNOSIS — I10 Essential (primary) hypertension: Secondary | ICD-10-CM | POA: Insufficient documentation

## 2013-12-27 DIAGNOSIS — R079 Chest pain, unspecified: Secondary | ICD-10-CM | POA: Insufficient documentation

## 2013-12-27 DIAGNOSIS — F17200 Nicotine dependence, unspecified, uncomplicated: Secondary | ICD-10-CM | POA: Insufficient documentation

## 2013-12-27 DIAGNOSIS — Z79899 Other long term (current) drug therapy: Secondary | ICD-10-CM | POA: Insufficient documentation

## 2013-12-27 LAB — BASIC METABOLIC PANEL WITH GFR
BUN: 23 mg/dL (ref 6–23)
CO2: 27 meq/L (ref 19–32)
Calcium: 9.8 mg/dL (ref 8.4–10.5)
Chloride: 97 meq/L (ref 96–112)
Creatinine, Ser: 0.7 mg/dL (ref 0.50–1.10)
GFR calc Af Amer: >90 mL/min
GFR calc non Af Amer: >90 mL/min
Glucose, Bld: 111 mg/dL — ABNORMAL HIGH (ref 70–99)
Potassium: 3.8 meq/L (ref 3.7–5.3)
Sodium: 136 meq/L — ABNORMAL LOW (ref 137–147)

## 2013-12-27 LAB — D-DIMER, QUANTITATIVE (NOT AT ARMC): D-Dimer, Quant: 0.27 ug/mL-FEU (ref 0.00–0.48)

## 2013-12-27 LAB — CBC WITH DIFFERENTIAL/PLATELET
Basophils Absolute: 0 K/uL (ref 0.0–0.1)
Basophils Relative: 0 % (ref 0–1)
Eosinophils Absolute: 0.3 K/uL (ref 0.0–0.7)
Eosinophils Relative: 3 % (ref 0–5)
HCT: 39 % (ref 36.0–46.0)
Hemoglobin: 13.2 g/dL (ref 12.0–15.0)
Lymphocytes Relative: 32 % (ref 12–46)
Lymphs Abs: 3.3 K/uL (ref 0.7–4.0)
MCH: 32.6 pg (ref 26.0–34.0)
MCHC: 33.8 g/dL (ref 30.0–36.0)
MCV: 96.3 fL (ref 78.0–100.0)
Monocytes Absolute: 1 K/uL (ref 0.1–1.0)
Monocytes Relative: 9 % (ref 3–12)
Neutro Abs: 5.8 K/uL (ref 1.7–7.7)
Neutrophils Relative %: 56 % (ref 43–77)
Platelets: 296 K/uL (ref 150–400)
RBC: 4.05 MIL/uL (ref 3.87–5.11)
RDW: 11.8 % (ref 11.5–15.5)
WBC: 10.4 K/uL (ref 4.0–10.5)

## 2013-12-27 LAB — TROPONIN I: Troponin I: <0.3 ng/mL (ref <0.30)

## 2013-12-27 MED ORDER — ASPIRIN 81 MG PO CHEW
CHEWABLE_TABLET | ORAL | Status: AC
  Administered 2013-12-27: 324 mg via ORAL
  Filled 2013-12-27: qty 4

## 2013-12-27 MED ORDER — KETOROLAC TROMETHAMINE 30 MG/ML IJ SOLN
30.0000 mg | Freq: Once | INTRAMUSCULAR | Status: AC
  Administered 2013-12-27: 30 mg via INTRAVENOUS
  Filled 2013-12-27: qty 1
  Filled 2013-12-27: qty 2

## 2013-12-27 MED ORDER — ALBUTEROL SULFATE (2.5 MG/3ML) 0.083% IN NEBU
INHALATION_SOLUTION | RESPIRATORY_TRACT | Status: AC
  Filled 2013-12-27: qty 6

## 2013-12-27 MED ORDER — IPRATROPIUM BROMIDE 0.02 % IN SOLN
RESPIRATORY_TRACT | Status: DC
Start: 2013-12-27 — End: 2013-12-28
  Filled 2013-12-27: qty 2.5

## 2013-12-27 MED ORDER — ASPIRIN 81 MG PO CHEW
324.0000 mg | CHEWABLE_TABLET | Freq: Once | ORAL | Status: AC
  Administered 2013-12-27: 324 mg via ORAL

## 2013-12-27 MED ORDER — IBUPROFEN 600 MG PO TABS: 600.0000 mg | ORAL_TABLET | Freq: Four times a day (QID) | ORAL | Status: DC | PRN

## 2013-12-27 MED ORDER — ASPIRIN 325 MG PO TABS: 325.0000 mg | ORAL_TABLET | Freq: Once | ORAL | Status: DC

## 2013-12-27 NOTE — ED Provider Notes (Deleted)
CSN: [id number]     Arrival date & time 12/27/13  1633 History  This chart was scribed for Shanna CiscoMegan E Docherty, MD by Beverly MilchJ Harrison Collins, ED Scribe. This patient was seen in room MH03/MH03 and the patient's care was started at 5:20 PM.    Chief Complaint  Patient presents with  . Chest Pain      Patient is a 38 y.o. female presenting with chest pain. The history is provided by the patient. No language interpreter was used.  Chest Pain Pain quality: dull, stabbing and tightness   Pain radiates to:  Does not radiate Pain radiates to the back: no   Pain severity:  Mild Onset quality:  Gradual Duration:  1 day Timing:  Intermittent Chronicity:  Recurrent Context: movement (exercise)   Worsened by:  Exertion and movement Associated symptoms: no abdominal pain, no back pain, no cough, no diaphoresis, no fatigue, no fever, no headache, no nausea, no numbness, no palpitations, no shortness of breath, not vomiting and no weakness   Risk factors: hypertension and smoking (Pt states she is a heavy smoker.)   Risk factors: no immobilization and no prior DVT/PE   Pt states she has a family h/o CAD. She states her brother had MIs and bypass surgery by the time he was 4539 and her grandmother also had a h/o MIs. Pt reports she had a 6 hour episode yesterday and a four hour episode today. Today, she states the pain began after a few minutes on the elliptical. Pt also reports high stress over the last few weeks due to family health concerns.   Past Medical History  Diagnosis Date  . Hypertension     Past Surgical History  Procedure Laterality Date  . Wisdom tooth extraction    . Cervical cone biopsy      for HPV    Family History  Problem Relation Age of Onset  . Prostate cancer Father     prostate  . Brain cancer Daughter     neurofibromatosis, optic neurogliomas  . Diabetes Maternal Uncle   . Heart disease Maternal Grandmother     CBAG in late 6270s  . Heart attack Maternal Grandfather 60   . Stroke Neg Hx     History  Substance Use Topics  . Smoking status: Current Every Day Smoker    Types: Cigarettes  . Smokeless tobacco: Never Used     Comment: smoked 1992-present , up to 1.5 ppd  . Alcohol Use: Yes     Comment: 1.5 bottles / night    OB History   Grav Para Term Preterm Abortions TAB SAB Ect Mult Living                  Review of Systems  Constitutional: Negative for fever, chills, diaphoresis, activity change, appetite change and fatigue.  HENT: Negative for congestion, facial swelling, rhinorrhea and sore throat.   Eyes: Negative for photophobia and discharge.  Respiratory: Negative for cough, chest tightness and shortness of breath.   Cardiovascular: Positive for chest pain. Negative for palpitations and leg swelling.  Gastrointestinal: Negative for nausea, vomiting, abdominal pain and diarrhea.  Endocrine: Negative for polydipsia and polyuria.  Genitourinary: Negative for dysuria, frequency, difficulty urinating and pelvic pain.  Musculoskeletal: Negative for arthralgias, back pain, neck pain and neck stiffness.  Skin: Negative for color change and wound.  Allergic/Immunologic: Negative for immunocompromised state.  Neurological: Negative for facial asymmetry, weakness, numbness and headaches.  Hematological: Does not bruise/bleed easily.  Psychiatric/Behavioral:  Negative for confusion and agitation.      Allergies  Sulfa antibiotics  Home Medications   Current Outpatient Rx  Name  Route  Sig  Dispense  Refill  . lisinopril-hydrochlorothiazide (PRINZIDE,ZESTORETIC) 20-12.5 MG per tablet      TAKE 2 TABLETS BY MOUTH DAILY.   60 tablet   2   . norethindrone (CAMILA) 0.35 MG tablet   Oral   Take 1 tablet by mouth daily.         . sertraline (ZOLOFT) 100 MG tablet   Oral   Take 100 mg by mouth daily.          Triage Vitals: BP 131/88  Pulse 89  Temp(Src) 99.1 F (37.3 C) (Oral)  Resp 18  Ht 5' 4.5" (1.638 m)  Wt 140 lb (63.504  kg)  BMI 23.67 kg/m2  SpO2 100%   Physical Exam  Nursing note and vitals reviewed. Constitutional: She is oriented to person, place, and time. She appears well-developed and well-nourished. No distress.  HENT:  Head: Normocephalic.  Mouth/Throat: Oropharynx is clear and moist.  Eyes: Pupils are equal, round, and reactive to light.  Neck: Neck supple.  Cardiovascular: Normal rate, regular rhythm and normal heart sounds.   Pulmonary/Chest: Effort normal and breath sounds normal. No respiratory distress. She has no wheezes.  Abdominal: Soft. Bowel sounds are normal. She exhibits no distension. There is no tenderness. There is no rebound and no guarding.  Musculoskeletal: She exhibits no edema and no tenderness.  Neurological: She is alert and oriented to person, place, and time.  Skin: Skin is warm and dry.  Psychiatric: She has a normal mood and affect.    ED Course  Procedures (including critical care time)  DIAGNOSTIC STUDIES: Oxygen Saturation is 100% on RA, normal by my interpretation.    COORDINATION OF CARE: 5:30 PM- Pt advised of plan for treatment and pt agrees.    Labs Review Labs Reviewed  CBC WITH DIFFERENTIAL  BASIC METABOLIC PANEL  TROPONIN I   Imaging Review No results found.   EKG Interpretation None      MDM   Final diagnoses:  None    Pt is a 38 y.o. female with Pmhx as above who presents with 2 episodes of CP, one yesterday at rest lasting about 6 hrs, no today that started while on eliptical that last about 5 hrs.  It resolved spontaneously. SHe had no radiation & no assoc symptoms. Risk factors for ACS include tob use & Fhx.  On PE, VSS, pt in NAD. Cardiopulm exam benign. EKG w/o acute ischemic changes.  Delta trop done which was negative. D-dimer not elevated.  She had breif episode of CP here which was described as intermittent, every 5 min lasting seconds. This resolved after toradol.  Doubt ACS, but given risk factors, spoke w/ cardiology and  pt will be set up for outpt stress testing. Rec scheduled NSAIDs, and rest until stress test.  Return precautions given for new or worsening symptoms including return of symptoms, developments of assoc symptoms.        I personally performed the services described in this documentation, which was scribed in my presence. The recorded information has been reviewed and is accurate.  I personally performed the services described in this documentation, which was scribed in my presence. The recorded information has been reviewed and is accurate.    Shanna Cisco, MD 12/29/13 [phone]

## 2013-12-27 NOTE — ED Notes (Signed)
Chest tightness yesterday  Which subsidede   Then again today while exercising which subsided on the way here

## 2013-12-27 NOTE — Telephone Encounter (Signed)
Pt called reporting chest pain, no appointments available.  Advised pt to go to ER for evaluation.

## 2013-12-27 NOTE — Discharge Instructions (Signed)
Chest Pain (Nonspecific) °Chest pain has many causes. Your pain could be caused by something serious, such as a heart attack or a blood clot in the lungs. It could also be caused by something less serious, such as a chest bruise or a virus. Follow up with your doctor. More lab tests or other studies may be needed to find the cause of your pain. Most of the time, nonspecific chest pain will improve within 2 to 3 days of rest and mild pain medicine. °HOME CARE °· For chest bruises, you may put ice on the sore area for 15-20 minutes, 03-04 times a day. Do this only if it makes you feel better. °· Put ice in a plastic bag. °· Place a towel between the skin and the bag. °· Rest for the next 2 to 3 days. °· Go back to work if the pain improves. °· See your doctor if the pain lasts longer than 1 to 2 weeks. °· Only take medicine as told by your doctor. °· Quit smoking if you smoke. °GET HELP RIGHT AWAY IF:  °· There is more pain or pain that spreads to the arm, neck, jaw, back, or belly (abdomen). °· You have shortness of breath. °· You cough more than usual or cough up blood. °· You have very bad back or belly pain, feel sick to your stomach (nauseous), or throw up (vomit). °· You have very bad weakness. °· You pass out (faint). °· You have a fever. °Any of these problems may be serious and may be an emergency. Do not wait to see if the problems will go away. Get medical help right away. Call your local emergency services 911 in [place] Do not drive yourself to the hospital. °MAKE SURE YOU:  °· Understand these instructions. °· Will watch this condition. °· Will get help right away if you or your child is not doing well or gets worse. °Document Released: 03/18/2008 Document Revised: 12/23/2011 Document Reviewed: 03/18/2008 °[employer]® Patient Information ©2014 [employer]. ° °

## 2013-12-27 NOTE — ED Notes (Signed)
RT at bedside for bloodwork. 

## 2013-12-29 NOTE — ED Provider Notes (Signed)
CSN: [id number]     Arrival date & time 12/27/13  1633 History  This chart was scribed for Shanna CiscoMegan E Calisha Tindel, MD by Beverly MilchJ Harrison Collins, ED Scribe. This patient was seen in room MH03/MH03 and the patient's care was started at 5:20 PM.    Chief Complaint  Patient presents with  . Chest Pain      Chest Pain Pain quality: dull, stabbing and tightness   Pain radiates to:  Does not radiate Pain radiates to the back: no   Pain severity:  Mild Onset quality:  Gradual Duration:  1 day Timing:  Intermittent Chronicity:  Recurrent Context: movement (exercise)   Worsened by:  Exertion and movement Associated symptoms: no abdominal pain, no back pain, no cough, no diaphoresis, no fatigue, no fever, no headache, no nausea, no numbness, no palpitations, no shortness of breath, not vomiting and no weakness   Risk factors: hypertension and smoking (Pt states she is a heavy smoker.)   Risk factors: no immobilization and no prior DVT/PE   Patient is a 38 y.o. female presenting with chest pain. The history is provided by the patient. No language interpreter was used.  Pt states she has a family h/o CAD. She states her brother had MIs and bypass surgery by the time he was 7139 and her grandmother also had a h/o MIs. Pt reports she had a 6 hour episode yesterday and a four hour episode today. Today, she states the pain began after a few minutes on the elliptical. Pt also reports high stress over the last few weeks due to family health concerns.   Past Medical History  Diagnosis Date  . Hypertension     Past Surgical History  Procedure Laterality Date  . Wisdom tooth extraction    . Cervical cone biopsy      for HPV    Family History  Problem Relation Age of Onset  . Prostate cancer Father     prostate  . Brain cancer Daughter     neurofibromatosis, optic neurogliomas  . Diabetes Maternal Uncle   . Heart disease Maternal Grandmother     CBAG in late 570s  . Heart attack Maternal Grandfather 60   . Stroke Neg Hx     History  Substance Use Topics  . Smoking status: Current Every Day Smoker    Types: Cigarettes  . Smokeless tobacco: Never Used     Comment: smoked 1992-present , up to 1.5 ppd  . Alcohol Use: Yes     Comment: 1.5 bottles / night    OB History   Grav Para Term Preterm Abortions TAB SAB Ect Mult Living                  Review of Systems  Constitutional: Negative for fever, chills, diaphoresis, activity change, appetite change and fatigue.  HENT: Negative for congestion, facial swelling, rhinorrhea and sore throat.   Eyes: Negative for photophobia and discharge.  Respiratory: Negative for cough, chest tightness and shortness of breath.   Cardiovascular: Positive for chest pain. Negative for palpitations and leg swelling.  Gastrointestinal: Negative for nausea, vomiting, abdominal pain and diarrhea.  Endocrine: Negative for polydipsia and polyuria.  Genitourinary: Negative for dysuria, frequency, difficulty urinating and pelvic pain.  Musculoskeletal: Negative for arthralgias, back pain, neck pain and neck stiffness.  Skin: Negative for color change and wound.  Allergic/Immunologic: Negative for immunocompromised state.  Neurological: Negative for facial asymmetry, weakness, numbness and headaches.  Hematological: Does not bruise/bleed easily.  Psychiatric/Behavioral:  Negative for confusion and agitation.      Allergies  Sulfa antibiotics  Home Medications   Current Outpatient Rx  Name  Route  Sig  Dispense  Refill  . ibuprofen (ADVIL,MOTRIN) 600 MG tablet   Oral   Take 1 tablet (600 mg total) by mouth every 6 (six) hours as needed.   30 tablet   0   . lisinopril-hydrochlorothiazide (PRINZIDE,ZESTORETIC) 20-12.5 MG per tablet      TAKE 2 TABLETS BY MOUTH DAILY.   60 tablet   2   . norethindrone (CAMILA) 0.35 MG tablet   Oral   Take 1 tablet by mouth daily.         . sertraline (ZOLOFT) 100 MG tablet   Oral   Take 100 mg by mouth  daily.          Triage Vitals: BP 131/88  Pulse 89  Temp(Src) 99.1 F (37.3 C) (Oral)  Resp 18  Ht 5' 4.5" (1.638 m)  Wt 140 lb (63.504 kg)  BMI 23.67 kg/m2  SpO2 100%   Physical Exam  Nursing note and vitals reviewed. Constitutional: She is oriented to person, place, and time. She appears well-developed and well-nourished. No distress.  HENT:  Head: Normocephalic.  Mouth/Throat: Oropharynx is clear and moist.  Eyes: Pupils are equal, round, and reactive to light.  Neck: Neck supple.  Cardiovascular: Normal rate, regular rhythm and normal heart sounds.   Pulmonary/Chest: Effort normal and breath sounds normal. No respiratory distress. She has no wheezes.  Abdominal: Soft. Bowel sounds are normal. She exhibits no distension. There is no tenderness. There is no rebound and no guarding.  Musculoskeletal: She exhibits no edema and no tenderness.  Neurological: She is alert and oriented to person, place, and time.  Skin: Skin is warm and dry.  Psychiatric: She has a normal mood and affect.    ED Course  Procedures (including critical care time)  DIAGNOSTIC STUDIES: Oxygen Saturation is 100% on RA, normal by my interpretation.    COORDINATION OF CARE: 5:30 PM- Pt advised of plan for treatment and pt agrees.    Labs Review Labs Reviewed  BASIC METABOLIC PANEL - Abnormal; Notable for the following:    Sodium 136 (*)    Glucose, Bld 111 (*)    All other components within normal limits  CBC WITH DIFFERENTIAL  TROPONIN I  D-DIMER, QUANTITATIVE  TROPONIN I   Imaging Review Dg Chest 2 View  12/27/2013   CLINICAL DATA:  Chest pain  EXAM: CHEST  2 VIEW  FINDINGS: Normal mediastinum and cardiac silhouette. Normal pulmonary vasculature. No evidence of effusion, infiltrate, or pneumothorax. No acute bony abnormality.  IMPRESSION: Normal chest radiograph   Electronically Signed   By: Genevive Bi M.D.   On: 12/27/2013 17:49     EKG Interpretation   Date/Time:  Monday  December 27 2013 16:52:55 EDT Ventricular Rate:  88 PR Interval:  164 QRS Duration: 82 QT Interval:  356 QTC Calculation: 430 R Axis:   75 Text Interpretation:  Normal sinus rhythm Normal ECG Confirmed by Aaric Dolph   MD, Oyindamola Key ([id number]) on 12/27/2013 5:35:04 PM      MDM   Final diagnoses:  Chest pain    Pt is a 38 y.o. female with Pmhx as above who presents with 2 episodes of CP, one yesterday at rest lasting about 6 hrs, no today that started while on eliptical that last about 5 hrs.  It resolved spontaneously. SHe had no radiation & no  assoc symptoms. Risk factors for ACS include tob use & Fhx.  On PE, VSS, pt in NAD. Cardiopulm exam benign. EKG w/o acute ischemic changes.  Delta trop done which was negative. D-dimer not elevated.  She had breif episode of CP here which was described as intermittent, every 5 min lasting seconds. This resolved after toradol.  Doubt ACS, but given risk factors, spoke w/ cardiology and pt will be set up for outpt stress testing. Rec scheduled NSAIDs, and rest until stress test.  Return precautions given for new or worsening symptoms including return of symptoms, developments of assoc symptoms.        I personally performed the services described in this documentation, which was scribed in my presence. The recorded information has been reviewed and is accurate.  I personally performed the services described in this documentation, which was scribed in my presence. The recorded information has been reviewed and is accurate.    Shanna Cisco, MD 12/29/13 1123  Shanna Cisco, MD 12/29/13 1210

## 2014-01-11 NOTE — ED Notes (Signed)
Pt called and sts she called to schedule outpt stress test but no order was put in. Pt was advised to contact cardiology to order stress test. Pt agreed and is ok with plan.

## 2014-01-26 ENCOUNTER — Telehealth: Payer: Self-pay | Admitting: Internal Medicine

## 2014-01-26 NOTE — Telephone Encounter (Signed)
   She needs an office visit in followup from her 12/27/13 emergency room visit. Her insurance will not cover this without an update of her chart.

## 2014-01-26 NOTE — Telephone Encounter (Signed)
Patient called stating that she needs an order to have a stress test.

## 2014-01-27 NOTE — Telephone Encounter (Signed)
lmovm for pt to return call.  

## 2014-01-27 NOTE — Telephone Encounter (Signed)
Discussed with pt & scheduled appt for 4.22.15.

## 2014-02-02 ENCOUNTER — Telehealth: Payer: Self-pay | Admitting: Internal Medicine

## 2014-02-02 ENCOUNTER — Encounter: Payer: Self-pay | Admitting: Internal Medicine

## 2014-02-02 ENCOUNTER — Ambulatory Visit (INDEPENDENT_AMBULATORY_CARE_PROVIDER_SITE_OTHER): Payer: BC Managed Care – PPO | Admitting: Internal Medicine

## 2014-02-02 VITALS — BP 138/88 | HR 102 | Temp 98.4°F | Wt 150.2 lb

## 2014-02-02 DIAGNOSIS — Z8249 Family history of ischemic heart disease and other diseases of the circulatory system: Secondary | ICD-10-CM

## 2014-02-02 DIAGNOSIS — R0789 Other chest pain: Secondary | ICD-10-CM

## 2014-02-02 NOTE — Patient Instructions (Signed)
The Stress Test referral will be scheduled and you'll be notified of the time. Please think about quitting smoking. Review the risks we discussed. Please call 1-800-QUIT-NOW ([phone]) for free smoking cessation counseling.

## 2014-02-02 NOTE — Progress Notes (Signed)
Pre visit review using our clinic review tool, if applicable. No additional management support is needed unless otherwise documented below in the visit note. 

## 2014-02-02 NOTE — Telephone Encounter (Signed)
Relevant patient education mailed to patient.  

## 2014-02-02 NOTE — Progress Notes (Signed)
Subjective:    Patient ID: Michelle Myers, female    DOB: July 24, 1976, 38 y.o.   MRN: [id number]  HPI   Her symptoms began almost 5 weeks ago as intermittent substernal and left sternal area chest pain which was described as light-dull-sharp recurring for several minutes. It lasted for 48 hours and stopped.  Since that time intermittently she feels like she has some tightness but cannot tell whether this in her chest or back.  Additionally she's had some discomfort for the last 3.5-4 weeks in her right calf when she goes down stairs or runs.  She is able to exercise 4 days a week for 40 minutes on elliptical w/o experiencing the symptoms. The chest pain that she had been having is not associated with nausea diaphoresis.  She continues to smoke but is down to 20 cigarettes per day.  Her brother had 2 myocardial infarctions and 2 vessel bypass by age 38.  She had a negative stress test in 2005.  She was seen at urgent care 3/16; those records were reviewed. Troponin was negative x2 and d-dimer was normal. EKG was also within normal limits.    Review of Systems No associated fever, chills, sweats, or weight loss are described. Palpitations, edema,  or  Definite claudication (see above)are denied. Cough is described as intermittent, a "smoker's cough". She will occasionally have scant brown/yellow sputum. She denies dyspnea or hemoptysis . There is no associated back pain with anterior radiation of discomfort. No associated weakness, numbness/tingling in arms. Absent are dyspepsia, significant reflux or dysphagia. No changes in skin temperature or color in the area of the symptoms. Rash or skin lesions absent. No history of abnormal bruising or bleeding.      Objective:   Physical Exam Gen.: Healthy and well-nourished in appearance. Alert, appropriate and cooperative throughout exam. Appears younger than stated age   Eyes: No corneal or conjunctival inflammation noted. Pupils  equal round reactive to light and accommodation. No icterus Nose: External nasal exam reveals no deformity or inflammation. Nasal mucosa are pink and moist. No lesions or exudates noted.   Mouth: Oral mucosa and oropharynx reveal no lesions or exudates. Teeth in good repair. Neck: No deformities, masses, or tenderness noted. Range of motion & Thyroid normal. Lungs: Normal respiratory effort; chest expands symmetrically. Lungs are clear to auscultation without rales, wheezes, or increased work of breathing. Heart: Normal rate and rhythm. Normal S1 and S2. No gallop, click, or rub. No murmur. Abdomen: Bowel sounds normal; abdomen soft and nontender. No masses, organomegaly or hernias noted.                             Musculoskeletal/extremities: No deformity or scoliosis noted of  the thoracic or lumbar spine.  No clubbing, cyanosis, edema, or significant extremity  deformity noted. Range of motion normal .Tone & strength normal. Hand joints normal  Fingernail health good. Able to lie down & sit up w/o help. Negative SLR bilaterally. Homan' s neg Vascular: Carotid, radial artery, dorsalis pedis and  posterior tibial pulses are full and equal. No bruits present. Neurologic: Alert and oriented x3. Deep tendon reflexes symmetrical and normal.  Gait normal. Skin: Intact without suspicious lesions or rashes. Lymph: No cervical, axillary lymphadenopathy present. Psych: Mood and affect are normal. Normally interactive  Assessment & Plan:  #1 atypical chest pain #3 smoker #3 FH CAD See orders & AVS

## 2014-10-05 ENCOUNTER — Encounter: Payer: Self-pay | Admitting: Internal Medicine

## 2014-10-05 ENCOUNTER — Other Ambulatory Visit (INDEPENDENT_AMBULATORY_CARE_PROVIDER_SITE_OTHER): Payer: BC Managed Care – PPO

## 2014-10-05 ENCOUNTER — Ambulatory Visit (INDEPENDENT_AMBULATORY_CARE_PROVIDER_SITE_OTHER): Payer: BC Managed Care – PPO | Admitting: Internal Medicine

## 2014-10-05 VITALS — BP 132/82 | HR 94 | Temp 98.9°F | Resp 14 | Ht 64.5 in | Wt 163.0 lb

## 2014-10-05 DIAGNOSIS — Z0000 Encounter for general adult medical examination without abnormal findings: Secondary | ICD-10-CM

## 2014-10-05 DIAGNOSIS — Z0189 Encounter for other specified special examinations: Secondary | ICD-10-CM

## 2014-10-05 LAB — BASIC METABOLIC PANEL
BUN: 18 mg/dL (ref 6–23)
CHLORIDE: 100 meq/L (ref 96–112)
CO2: 25 mEq/L (ref 19–32)
Calcium: 9.4 mg/dL (ref 8.4–10.5)
Creatinine, Ser: 0.6 mg/dL (ref 0.4–1.2)
GFR: 112.06 mL/min (ref >60.00)
Glucose, Bld: 100 mg/dL — ABNORMAL HIGH (ref 70–99)
POTASSIUM: 5.1 meq/L (ref 3.5–5.1)
Sodium: 133 mEq/L — ABNORMAL LOW (ref 135–145)

## 2014-10-05 LAB — HEPATIC FUNCTION PANEL
ALT: 21 U/L (ref 0–35)
AST: 25 U/L (ref 0–37)
Albumin: 4.7 g/dL (ref 3.5–5.2)
Alkaline Phosphatase: 66 U/L (ref 39–117)
BILIRUBIN DIRECT: 0 mg/dL (ref 0.0–0.3)
TOTAL PROTEIN: 8 g/dL (ref 6.0–8.3)
Total Bilirubin: 0.5 mg/dL (ref 0.2–1.2)

## 2014-10-05 LAB — CBC WITH DIFFERENTIAL/PLATELET
Basophils Absolute: 0 10*3/uL (ref 0.0–0.1)
Basophils Relative: 0.3 % (ref 0.0–3.0)
EOS PCT: 2.6 % (ref 0.0–5.0)
Eosinophils Absolute: 0.2 10*3/uL (ref 0.0–0.7)
HCT: 40.2 % (ref 36.0–46.0)
Hemoglobin: 13.5 g/dL (ref 12.0–15.0)
LYMPHS ABS: 2.3 10*3/uL (ref 0.7–4.0)
Lymphocytes Relative: 28.2 % (ref 12.0–46.0)
MCHC: 33.7 g/dL (ref 30.0–36.0)
MCV: 95.2 fl (ref 78.0–100.0)
Monocytes Absolute: 0.8 10*3/uL (ref 0.1–1.0)
Monocytes Relative: 9.6 % (ref 3.0–12.0)
NEUTROS PCT: 59.3 % (ref 43.0–77.0)
Neutro Abs: 4.8 10*3/uL (ref 1.4–7.7)
Platelets: 326 10*3/uL (ref 150.0–400.0)
RBC: 4.22 Mil/uL (ref 3.87–5.11)
RDW: 12.7 % (ref 11.5–15.5)
WBC: 8.1 10*3/uL (ref 4.0–10.5)

## 2014-10-05 LAB — LIPID PANEL
Cholesterol: 183 mg/dL (ref 0–200)
HDL: 51.5 mg/dL (ref >39.00)
LDL Cholesterol: 121 mg/dL — ABNORMAL HIGH (ref 0–99)
NONHDL: 131.5
TRIGLYCERIDES: 53 mg/dL (ref 0.0–149.0)
Total CHOL/HDL Ratio: 4
VLDL: 10.6 mg/dL (ref 0.0–40.0)

## 2014-10-05 LAB — TSH: TSH: 1.6 u[IU]/mL (ref 0.35–4.50)

## 2014-10-05 NOTE — Progress Notes (Signed)
Pre visit review using our clinic review tool, if applicable. No additional management support is needed unless otherwise documented below in the visit note. 

## 2014-10-05 NOTE — Patient Instructions (Addendum)
Your next office appointment will be determined based upon review of your pending labs . Those instructions will be transmitted to you through My Chart    Minimal Blood Pressure Goal= AVERAGE < 140/90;  Ideal is an AVERAGE < 135/85. This AVERAGE should be calculated from @ least 5-7 BP readings taken @ different times of day on different days of week. You should not respond to isolated BP readings , but rather the AVERAGE for that week .Please bring your  blood pressure cuff to office visits to verify that it is reliable.It  can also be checked against the blood pressure device at the pharmacy. Finger or wrist cuffs are not dependable; an arm cuff is.   Alcohol intake is recommended as less than 1-2 drinks per day for men &  less than 1 drink per day for women. Alcohol could raise the Serotonin level as we discussed.

## 2014-10-05 NOTE — Progress Notes (Signed)
Subjective:    Patient ID: Michelle Myers, female    DOB: [birth year], 38 y.o.   MRN: [id number]  HPI  She is here for a physical;acute issues denied.   She does eat red meat and does add salt to her food.  She's exercising for 40 minutes 4 times a week using an elliptical or treadmill without associated cardiopulmonary symptoms  She has had occasional edema which she attributes to possible increased sodium intake  There is no family history of premature heart attack or stroke.  She is on 100 mg of sertraline which she had been taking in the morning. She does drink 1.5 bottles of wine nightly.She also has been taking hydrocodone tid from a friend.  Review of Systems   Chest pain, palpitations, tachycardia, exertional dyspnea, paroxysmal nocturnal dyspnea, claudication or edema are absen even with CVEt.      Objective:   Physical Exam Gen.: Healthy and well-nourished in appearance. Alert, appropriate and cooperative throughout exam. Appears younger than stated age  Head: Normocephalic without obvious abnormalities  Eyes: No corneal or conjunctival inflammation noted. Pupils equal round reactive to light and accommodation. Extraocular motion intact.  Ears: External  ear exam reveals no significant lesions or deformities. Canals clear .TMs normal. Hearing is grossly normal bilaterally. Nose: External nasal exam reveals no deformity or inflammation. Nasal mucosa are pink and moist. No lesions or exudates noted.   Mouth: Oral mucosa and oropharynx reveal no lesions or exudates. Teeth in good repair. Neck: No deformities, masses, or tenderness noted. Range of motion &  Thyroid normal. Lungs: Normal respiratory effort; chest expands symmetrically. Lungs are clear to auscultation without rales, wheezes, or increased work of breathing. Heart: Normal rate and rhythm. Normal S1 and S2. No gallop, click, or rub. No murmur. Abdomen: Bowel sounds normal; abdomen soft and nontender. No masses,  organomegaly or hernias noted. Genitalia: as per Gyn                                  Musculoskeletal/extremities: No deformity or scoliosis noted of  the thoracic or lumbar spine.  No clubbing, cyanosis, edema, or significant extremity  deformity noted.  Range of motion normal . Tone & strength normal. Hand joints normal  Fingernail health good. Able to lie down & sit up w/o help.  Negative SLR bilaterally Vascular: Carotid, radial artery, dorsalis pedis and  posterior tibial pulses are full and equal. No bruits present. Neurologic: Alert and oriented x3. Deep tendon reflexes symmetrical and normal.  Gait normal        Skin: Intact without suspicious lesions or rashes. Lymph: No cervical, axillary lymphadenopathy present. Psych: Mood and affect are normal. Normally interactive   & communicative                                                                                      Assessment & Plan:  #1 comprehensive physical exam; no acute findings #2 polypharmacy & increased alcohol intake risk discussed. I recommended she discuss decreasing Sertraline dose to 50 mg with her Gyn because of risk of Serotonin Syndrome. .Marland Kitchen  Weaning of alcohol & hydrocodone also discussed.  Plan: see Orders  & Recommendations

## 2014-10-10 ENCOUNTER — Other Ambulatory Visit (INDEPENDENT_AMBULATORY_CARE_PROVIDER_SITE_OTHER): Payer: BC Managed Care – PPO

## 2014-10-10 ENCOUNTER — Telehealth: Payer: Self-pay

## 2014-10-10 DIAGNOSIS — R739 Hyperglycemia, unspecified: Secondary | ICD-10-CM

## 2014-10-10 LAB — HEMOGLOBIN A1C: Hgb A1c MFr Bld: 5.4 % (ref 4.6–6.5)

## 2014-10-10 NOTE — Telephone Encounter (Signed)
Request for add on has been faxed to lab Info has been mailed to patient

## 2014-10-10 NOTE — Telephone Encounter (Signed)
-----   Message from Pecola LawlessWilliam F Hopper, MD sent at 10/07/2014 11:09 AM EST ----- Please add A1c (R73.9) if possible.  Add these comments to lab results & mail to her please.  The minimally elevated glucose & decreased sodium are not significant and no further evaluation is needed. LDL or BAD cholesterol is mildly elevated , but the HDL (GOOD) > 50 is protective.   Please follow a Mediaterranean type diet  (many good cook books readily available) or review Dr Gildardo GriffesWillett's book Eat, Drink & Be Healthy for best  dietary cholesterol information & options.  Cardiovascular exercise as you are doing is recommended 30-45 minutes 3-4 times per week.  All other lab results are excellent. [employer]

## 2014-12-20 ENCOUNTER — Encounter: Payer: Self-pay | Admitting: Internal Medicine

## 2014-12-20 ENCOUNTER — Ambulatory Visit (INDEPENDENT_AMBULATORY_CARE_PROVIDER_SITE_OTHER): Payer: BLUE CROSS/BLUE SHIELD | Admitting: Internal Medicine

## 2014-12-20 VITALS — BP 130/82 | HR 102 | Temp 98.7°F | Ht 65.0 in | Wt 166.0 lb

## 2014-12-20 DIAGNOSIS — J310 Chronic rhinitis: Secondary | ICD-10-CM

## 2014-12-20 DIAGNOSIS — R05 Cough: Secondary | ICD-10-CM

## 2014-12-20 DIAGNOSIS — R059 Cough, unspecified: Secondary | ICD-10-CM

## 2014-12-20 DIAGNOSIS — F17200 Nicotine dependence, unspecified, uncomplicated: Secondary | ICD-10-CM

## 2014-12-20 DIAGNOSIS — Z720 Tobacco use: Secondary | ICD-10-CM

## 2014-12-20 DIAGNOSIS — F410 Panic disorder [episodic paroxysmal anxiety] without agoraphobia: Secondary | ICD-10-CM

## 2014-12-20 MED ORDER — CLONAZEPAM 0.5 MG PO TABS: 0.5000 mg | ORAL_TABLET | Freq: Two times a day (BID) | ORAL | Status: DC | PRN

## 2014-12-20 MED ORDER — AZITHROMYCIN 250 MG PO TABS: ORAL_TABLET | ORAL | Status: DC

## 2014-12-20 NOTE — Progress Notes (Signed)
   Subjective:    Patient ID: Michelle Myers, female    DOB: 10-14-1976, [age] y.o.   MRN: [id number]  HPI Her symptoms began 10 days ago as profound rhinitis with clear drainage except for scant yellow material. She also had associated ear pressure which did not resolve until 3/7. She has occasional yellow/brown sputum only in the morning. She has quit using Afrin as directed. She does continue to smoke 1 pack per day.  She denies any extrinsic symptoms or other upper respiratory tract infection symptoms.   She describes a panic attack in late December or January. She felt she could not feel her pulse and was getting ready to call 911. She is asking about a prn medication. Her young daughter is in an  treatment program for optic glioblastomas & neurofibromatosis with [facility] testing & [facility]. She has PMH of symptoms beginning in college; she has seen Dr Nolen MuMcKinney, Psych.  Review of Systems  She specifically denies frontal headache, facial pain, fever, chills, sweats, significant nasal purulence, wheezing, or shortness of breath. She also denies itchy, watery eyes.     Objective:   Physical Exam  Pertinent positive findings include :  There is marked erythema of the nasal septum superiorly. She exhibits an S4 with slightly elevated pulse rate of 95. Breath sounds are slightly decreased. Markedly animated and clinically slightly anxious.  General appearance:Adequately nourished; no acute distress or increased work of breathing is present.  No  lymphadenopathy about the head, neck, or axilla noted.  Eyes: No conjunctival inflammation or lid edema is present. There is no scleral icterus. Ears:  External ear exam shows no significant lesions or deformities.  Otoscopic examination reveals clear canals, tympanic membranes are intact bilaterally without bulging, retraction, inflammation or discharge. Nose:  External nasal examination shows no deformity or inflammation. No septal dislocation  or deviation.No obstruction to airflow.  Oral exam: Dental hygiene is good; lips and gums are healthy appearing.There is no oropharyngeal erythema or exudate noted.  Neck:  No deformities, thyromegaly, masses, or tenderness noted.   Supple with full range of motion without pain.  Heart:  regular rhythm. S1 and S2 normal without gallop, murmur, click, rub or other extra sounds.  Lungs:Chest clear to auscultation; no wheezes, rhonchi,rales ,or rubs present. Extremities:  No cyanosis, edema, or clubbing  noted  Skin: Warm & dry w/o jaundice or tenting.       Assessment & Plan:   #1 nonallergic rhinitis  #2 possible bronchitis in the setting of one pack per day smoking  #3 panic attacks in the context of her daughter's serious health issues  Plan: See orders and recommendations

## 2014-12-20 NOTE — Patient Instructions (Signed)

## 2014-12-20 NOTE — Progress Notes (Signed)
Pre visit review using our clinic review tool, if applicable. No additional management support is needed unless otherwise documented below in the visit note. 

## 2014-12-21 ENCOUNTER — Telehealth: Payer: Self-pay | Admitting: Internal Medicine

## 2014-12-21 NOTE — Telephone Encounter (Signed)
emmi emailed °

## 2015-02-15 ENCOUNTER — Other Ambulatory Visit: Payer: Self-pay | Admitting: Obstetrics and Gynecology

## 2015-02-16 LAB — CYTOLOGY - PAP

## 2015-02-20 ENCOUNTER — Other Ambulatory Visit: Payer: Self-pay | Admitting: Obstetrics and Gynecology

## 2015-02-20 DIAGNOSIS — R928 Other abnormal and inconclusive findings on diagnostic imaging of breast: Secondary | ICD-10-CM

## 2015-02-27 ENCOUNTER — Ambulatory Visit
Admission: RE | Admit: 2015-02-27 | Discharge: 2015-02-27 | Disposition: A | Discharge status: Home / Self Care | Payer: BLUE CROSS/BLUE SHIELD | Source: Ambulatory Visit | Attending: Obstetrics and Gynecology | Admitting: Obstetrics and Gynecology

## 2015-02-27 DIAGNOSIS — R928 Other abnormal and inconclusive findings on diagnostic imaging of breast: Secondary | ICD-10-CM

## 2015-05-16 ENCOUNTER — Other Ambulatory Visit: Payer: Self-pay | Admitting: Internal Medicine

## 2015-05-16 NOTE — Telephone Encounter (Signed)
OK x1  My retirement date is 10/14/2015; but I will be in office on a limited schedule Oct-Dec. To guarantee continuity of care you should transition your care to another PCP by Oct 1,2016.     

## 2015-05-16 NOTE — Telephone Encounter (Signed)
Please advise. Last OV and refill 3/16

## 2015-05-17 ENCOUNTER — Other Ambulatory Visit: Payer: Self-pay | Admitting: Emergency Medicine

## 2015-05-17 DIAGNOSIS — F410 Panic disorder [episodic paroxysmal anxiety] without agoraphobia: Secondary | ICD-10-CM

## 2015-05-17 MED ORDER — CLONAZEPAM 0.5 MG PO TABS: 0.5000 mg | ORAL_TABLET | Freq: Two times a day (BID) | ORAL | Status: DC | PRN

## 2015-08-29 ENCOUNTER — Encounter: Payer: Self-pay | Admitting: Internal Medicine

## 2015-08-29 ENCOUNTER — Ambulatory Visit (INDEPENDENT_AMBULATORY_CARE_PROVIDER_SITE_OTHER): Payer: BLUE CROSS/BLUE SHIELD | Admitting: Internal Medicine

## 2015-08-29 VITALS — BP 120/80 | HR 99 | Temp 98.6°F | Ht 65.0 in | Wt 167.0 lb

## 2015-08-29 DIAGNOSIS — I10 Essential (primary) hypertension: Secondary | ICD-10-CM

## 2015-08-29 DIAGNOSIS — T887XXA Unspecified adverse effect of drug or medicament, initial encounter: Secondary | ICD-10-CM | POA: Diagnosis not present

## 2015-08-29 DIAGNOSIS — L309 Dermatitis, unspecified: Secondary | ICD-10-CM

## 2015-08-29 DIAGNOSIS — T50905A Adverse effect of unspecified drugs, medicaments and biological substances, initial encounter: Secondary | ICD-10-CM

## 2015-08-29 MED ORDER — MOMETASONE FUROATE 0.1 % EX OINT: TOPICAL_OINTMENT | CUTANEOUS | Status: DC

## 2015-08-29 MED ORDER — AMLODIPINE BESYLATE 5 MG PO TABS: 5.0000 mg | ORAL_TABLET | Freq: Every day | ORAL | Status: DC

## 2015-08-29 NOTE — Progress Notes (Signed)
Pre visit review using our clinic review tool, if applicable. No additional management support is needed unless otherwise documented below in the visit note. 

## 2015-08-29 NOTE — Progress Notes (Signed)
   Subjective:    Patient ID: Michelle Myers, female    DOB: 1976/09/27, 39 y.o.   MRN: [id number]  HPI  She describes an eczematoid rash over the left palm for the last 8 weeks. The trigger may have been handwashing and wearing her wedding ring. [facility] and [facility] Keflex and hydrocortisone 2.5% without benefit. She did get a vaginitis with the antibiotic Keflex.  In the last several months she's had 2 episodes of swelling of the lips. On one occasion remotely it was both lips. Recently it was her lower lip. She is on lisinopril/HCT.  She was also concerned about sertraline causing dementia and has weaned herself off. She was on 100 mg daily in addition to bupropion 150 mg daily. She has been drinking 1.5 bottles of wine a night.There is strong FH of alcoholism.  She admits to being stressed about her child receiving chemotherapy. That has been completed.  Review of Systems  No associated itchy, watery eyes.  Shortness of breath, wheezing, or cough absent.  No vesicles, pustules or urticaria noted.  Fever ,chills , or sweats denied.   Diarrhea not present.  No dysuria, pyuria or hematuria.     Objective:   Physical Exam  General appearance :adequately nourished; in no distress.  Eyes: No conjunctival inflammation or scleral icterus is present.  Oral exam:  Lips and gums are healthy appearing.There is no oropharyngeal erythema or exudate noted. Dental hygiene is good.  Heart:  Normal rate and regular rhythm. S1 and S2 normal without gallop, murmur, click, rub or other extra sounds    Lungs:Chest clear to auscultation; no wheezes, rhonchi,rales ,or rubs present.No increased work of breathing.   Abdomen: bowel sounds normal, soft and non-tender without masses, organomegaly or hernias noted.  No guarding or rebound. No flank tenderness to percussion.  Vascular : all pulses equal ; no bruits present.  Skin:Warm & dry. ; no tenting or jaundice . She  has a classic eczematoid rash over the left palm.  Lymphatic: No lymphadenopathy is noted about the head, neck, axilla, or inguinal areas.   Neuro: Strength, tone & DTRs normal.  Psych: She is oriented 3. She is intermittently tearful as she discusses her  daughters health issues.      Assessment & Plan:  #1 eczema  #2 anxiety,situational  #3 alcohol abuse, probable self-medication. I have recommended that she see a Psychologist.  #4 isolated episodes of angioedema in the context of an ACE inhibitor. ACE inhibitor will be discontinued.

## 2015-08-29 NOTE — Patient Instructions (Signed)

## 2015-09-01 ENCOUNTER — Ambulatory Visit: Payer: BLUE CROSS/BLUE SHIELD | Admitting: Internal Medicine

## 2015-09-08 ENCOUNTER — Encounter: Payer: Self-pay | Admitting: Internal Medicine

## 2015-09-10 ENCOUNTER — Encounter (HOSPITAL_BASED_OUTPATIENT_CLINIC_OR_DEPARTMENT_OTHER): Payer: Self-pay | Admitting: *Deleted

## 2015-09-10 ENCOUNTER — Emergency Department (HOSPITAL_BASED_OUTPATIENT_CLINIC_OR_DEPARTMENT_OTHER): Payer: BLUE CROSS/BLUE SHIELD

## 2015-09-10 ENCOUNTER — Other Ambulatory Visit: Payer: Self-pay

## 2015-09-10 ENCOUNTER — Emergency Department (HOSPITAL_BASED_OUTPATIENT_CLINIC_OR_DEPARTMENT_OTHER)
Admission: EM | Admit: 2015-09-10 | Discharge: 2015-09-10 | Disposition: A | Discharge status: Home / Self Care | Payer: BLUE CROSS/BLUE SHIELD | Attending: Emergency Medicine | Admitting: Emergency Medicine

## 2015-09-10 DIAGNOSIS — R079 Chest pain, unspecified: Secondary | ICD-10-CM

## 2015-09-10 DIAGNOSIS — M7989 Other specified soft tissue disorders: Secondary | ICD-10-CM | POA: Diagnosis not present

## 2015-09-10 DIAGNOSIS — Z793 Long term (current) use of hormonal contraceptives: Secondary | ICD-10-CM | POA: Diagnosis not present

## 2015-09-10 DIAGNOSIS — F17210 Nicotine dependence, cigarettes, uncomplicated: Secondary | ICD-10-CM | POA: Insufficient documentation

## 2015-09-10 DIAGNOSIS — Z79899 Other long term (current) drug therapy: Secondary | ICD-10-CM | POA: Insufficient documentation

## 2015-09-10 DIAGNOSIS — F419 Anxiety disorder, unspecified: Secondary | ICD-10-CM | POA: Insufficient documentation

## 2015-09-10 DIAGNOSIS — Z7982 Long term (current) use of aspirin: Secondary | ICD-10-CM | POA: Insufficient documentation

## 2015-09-10 LAB — BASIC METABOLIC PANEL
Anion gap: 8 (ref 5–15)
BUN: 11 mg/dL (ref 6–20)
CALCIUM: 8.9 mg/dL (ref 8.9–10.3)
CHLORIDE: 107 mmol/L (ref 101–111)
CO2: 23 mmol/L (ref 22–32)
CREATININE: 0.62 mg/dL (ref 0.44–1.00)
GFR calc Af Amer: >60 mL/min (ref >60)
GFR calc non Af Amer: >60 mL/min (ref >60)
GLUCOSE: 101 mg/dL — AB (ref 65–99)
Potassium: 3.6 mmol/L (ref 3.5–5.1)
Sodium: 138 mmol/L (ref 135–145)

## 2015-09-10 LAB — CBC
HEMATOCRIT: 40.1 % (ref 36.0–46.0)
Hemoglobin: 13.1 g/dL (ref 12.0–15.0)
MCH: 32.5 pg (ref 26.0–34.0)
MCHC: 32.7 g/dL (ref 30.0–36.0)
MCV: 99.5 fL (ref 78.0–100.0)
PLATELETS: 263 10*3/uL (ref 150–400)
RBC: 4.03 MIL/uL (ref 3.87–5.11)
RDW: 12.3 % (ref 11.5–15.5)
WBC: 8.5 10*3/uL (ref 4.0–10.5)

## 2015-09-10 LAB — TROPONIN I: Troponin I: <0.03 ng/mL (ref <0.031)

## 2015-09-10 NOTE — ED Notes (Signed)
Pt reports recurrent chest pain for 2 days- last episode began at 1530 and is resolving but "still feels tight"- Reports b/p has been elevated as well

## 2015-09-10 NOTE — ED Provider Notes (Signed)
CSN: [id number]     Arrival date & time 09/10/15  1632 History  By signing my name below, I, Michelle Myers, attest that this documentation has been prepared under the direction and in the presence of Michelle Bucco, MD. Electronically Signed: Jarvis Myers, ED Scribe. 09/10/2015. 6:56 PM.    Chief Complaint  Patient presents with  . Chest Pain   The history is provided by the patient. No language interpreter was used.    HPI Comments: Michelle Myers is a 39 y.o. female with a h/o HTN and anxiety who presents to the Emergency Department complaining of intermittent, episodic, moderate, centralized, non radiating, chest pain onset 2 days. Pt reports she has only had 2 episodes of the chest pain; her previous episode was 2 days ago and lasted around 3 hours and the episode that began today started around 2 hours ago. Pt reports associated elevated BP,and mild bilateral ankle swelling. She notes that her PCP recently changed her BP medications from lisinopril to amlodipine ; she reports it was changed due to side effects of mild face and lip swelling. Pt denies any alleviating/aggravating factors. She has been recording her BP readings at home since starting the amlodipine and her highest reading was 183/122. Pt notes she has a h/o anxiety but denies any recent anxiety triggers or increased anxiety. Pt denies any anxiety at this time. She denies any abdominal pain, nausea, vomiting, sweats, cough, congestion or other associated symptoms.  PCP: Michelle Melnick, MD   Past Medical History  Diagnosis Date  . Hypertension   . Anxiety    Past Surgical History  Procedure Laterality Date  . Wisdom tooth extraction    . Cervical cone biopsy      for HPV   Family History  Problem Relation Age of Onset  . Prostate cancer Father   . Brain cancer Daughter     neurofibromatosis, optic neurogliomas  . Diabetes Maternal Uncle   . Heart disease Maternal Grandmother     CBAG in late 74s  . Heart  attack Maternal Grandfather 60  . Stroke Neg Hx    Social History  Substance Use Topics  . Smoking status: Current Every Day Smoker -- 1.00 packs/day    Types: Cigarettes  . Smokeless tobacco: Never Used     Comment: smoked 1992-present , up to 1.5 ppd  . Alcohol Use: Yes     Comment: 1.5 bottle of wine / night   OB History    No data available     Review of Systems  Constitutional: Negative for fever, chills, diaphoresis and fatigue.  HENT: Negative for congestion, rhinorrhea and sneezing.   Eyes: Negative.   Respiratory: Negative for cough, chest tightness and shortness of breath.   Cardiovascular: Positive for chest pain and leg swelling (mild b/l ankle).  Gastrointestinal: Negative for nausea, vomiting, abdominal pain, diarrhea and blood in stool.  Genitourinary: Negative for frequency, hematuria, flank pain and difficulty urinating.  Musculoskeletal: Negative for back pain and arthralgias.  Skin: Negative for rash.  Neurological: Negative for dizziness, speech difficulty, weakness, numbness and headaches.  Psychiatric/Behavioral: The patient is not nervous/anxious.       Allergies  Lisinopril and Sulfa antibiotics  Home Medications   Prior to Admission medications   Medication Sig Start Date End Date Taking? Authorizing Provider  amLODipine (NORVASC) 5 MG tablet Take 1 tablet (5 mg total) by mouth daily. 08/29/15  Yes Pecola Lawless, MD  aspirin 81 MG tablet Take 81 mg by  mouth daily.   Yes Historical Provider, MD  buPROPion (WELLBUTRIN SR) 150 MG 12 hr tablet Take 150 mg by mouth daily.   Yes Historical Provider, MD  clonazePAM (KLONOPIN) 0.5 MG tablet Take 1 tablet (0.5 mg total) by mouth 2 (two) times daily as needed for anxiety. 05/17/15  Yes Pecola Lawless, MD  mometasone (ELOCON) 0.1 % ointment Apply bid 08/29/15  Yes Pecola Lawless, MD  norethindrone (CAMILA) 0.35 MG tablet Take 1 tablet by mouth daily.   Yes Historical Provider, MD   Triage Vitals: BP  182/111 mmHg  Pulse 109  Temp(Src) 98.7 F (37.1 C) (Oral)  Resp 18  Ht  (1.626 m)  Wt 165 lb (74.844 kg)  BMI 28.31 kg/m2  SpO2 100%  LMP 08/27/2015 (Approximate)  Physical Exam  Constitutional: She is oriented to person, place, and time. She appears well-developed and well-nourished.  HENT:  Head: Normocephalic and atraumatic.  Eyes: Pupils are equal, round, and reactive to light.  Neck: Normal range of motion. Neck supple.  Cardiovascular: Normal rate, regular rhythm and normal heart sounds.   Pulmonary/Chest: Effort normal and breath sounds normal. No respiratory distress. She has no wheezes. She has no rales. She exhibits no tenderness.  Abdominal: Soft. Bowel sounds are normal. There is no tenderness. There is no rebound and no guarding.  Musculoskeletal: Normal range of motion. She exhibits edema (trace bilaterally in lower extremities).  No calf tenderness  Lymphadenopathy:    She has no cervical adenopathy.  Neurological: She is alert and oriented to person, place, and time.  Skin: Skin is warm and dry. No rash noted.  Psychiatric: She has a normal mood and affect.    ED Course  Procedures (including critical care time)  DIAGNOSTIC STUDIES: Oxygen Saturation is 100% on RA, normal by my interpretation.    COORDINATION OF CARE: 5:26 PM- Pt advised of plan of treatment. Will order EKG, CXR, and diagnostic lab workup Per nurse, pt declined CXR until she speaks to the physician. Chest pain protocols were explained to pt.    5:46 PM- Advised pt to f/u with PCP in order to get BP medication under control. Will continue to monitor pt in ER and BP readings. She states her chest pain has resolved at this time so discussed that CXR is not necessary. Pt advised of plan for treatment and pt agrees.  5:52 PM- Pt declined 2nd troponin lab work. Discussed with pt importance of this test and she still declines. Pt advised of plan for treatment and pt agrees.      Labs  Review Labs Reviewed  BASIC METABOLIC PANEL - Abnormal; Notable for the following:    Glucose, Bld 101 (*)    All other components within normal limits  CBC  TROPONIN I    Imaging Review No results found. I have personally reviewed and evaluated these images and lab results as part of my medical decision-making.   EKG Interpretation   Date/Time:  Sunday September 10 2015 16:43:09 EST Ventricular Rate:  107 PR Interval:  154 QRS Duration: 82 QT Interval:  332 QTC Calculation: 443 R Axis:   76 Text Interpretation:  Sinus tachycardia Possible Left atrial enlargement  Borderline ECG T wave flattening, new as compared to prior EKG Confirmed  by Nat Lowenthal  MD, Nekeshia Lenhardt ([id number]) on 09/10/2015 6:50:14 PM      MDM   Final diagnoses:  Chest pain, unspecified chest pain type    Patient presents with chest pain and elevated  blood pressure. She currently is chest pain-free. She's had 2 episodes of chest pain last 2 days. They're nonexertional. It's in the center of her chest. It's nonpruritic. She has no associated cough or congestion. Her blood pressure has improved in the ED. She does appear to be anxious although she doesn't report anxiety. Her heart rate has been borderline elevated in the low 100s. She currently is still refusing a chest x-ray. She does have some new nonspecific changes on her EKG. I did encourage her again to stay for a second troponin. She's refusing this. She does have some risk factors for heart disease but she does not want to stay any longer for further evaluation. I did explain to her the risk of leaving. I also explained to her that it would be prudent to check a d-dimer given that she is on birth control pills, she is a smoker and she has ongoing tachycardia. She is also refusing this test. He was discharged home after refusing further evaluation. She states that she's going follow-up with her primary care provider tomorrow. I advised her return to the ED if she changes  her mind about further evaluation or if she has any worsening symptoms.  I personally performed the services described in this documentation, which was scribed in my presence.  The recorded information has been reviewed and considered.     Michelle BuccoMelanie Govanni Plemons, MD 09/10/15 [phone]

## 2015-09-10 NOTE — ED Notes (Signed)
Pt states she has been having some elevated BPs, now having some chest tightness

## 2015-09-10 NOTE — ED Notes (Signed)
ED chest pain protocols explained to pt. Pt states she doesn't want a chest xray until she discusses it with a doctor -- EDP made aware.

## 2015-09-10 NOTE — Discharge Instructions (Signed)
Nonspecific Chest Pain  °Chest pain can be caused by many different conditions. There is always a chance that your pain could be related to something serious, such as a heart attack or a blood clot in your lungs. Chest pain can also be caused by conditions that are not life-threatening. If you have chest pain, it is very important to follow up with your health care provider. °CAUSES  °Chest pain can be caused by: °· Heartburn. °· Pneumonia or bronchitis. °· Anxiety or stress. °· Inflammation around your heart (pericarditis) or lung (pleuritis or pleurisy). °· A blood clot in your lung. °· A collapsed lung (pneumothorax). It can develop suddenly on its own (spontaneous pneumothorax) or from trauma to the chest. °· Shingles infection (varicella-zoster virus). °· Heart attack. °· Damage to the bones, muscles, and cartilage that make up your chest wall. This can include: °¨ Bruised bones due to injury. °¨ Strained muscles or cartilage due to frequent or repeated coughing or overwork. °¨ Fracture to one or more ribs. °¨ Sore cartilage due to inflammation (costochondritis). °RISK FACTORS  °Risk factors for chest pain may include: °· Activities that increase your risk for trauma or injury to your chest. °· Respiratory infections or conditions that cause frequent coughing. °· Medical conditions or overeating that can cause heartburn. °· Heart disease or family history of heart disease. °· Conditions or health behaviors that increase your risk of developing a blood clot. °· Having had chicken pox (varicella zoster). °SIGNS AND SYMPTOMS °Chest pain can feel like: °· Burning or tingling on the surface of your chest or deep in your chest. °· Crushing, pressure, aching, or squeezing pain. °· Dull or sharp pain that is worse when you move, cough, or take a deep breath. °· Pain that is also felt in your back, neck, shoulder, or arm, or pain that spreads to any of these areas. °Your chest pain may come and go, or it may stay  constant. °DIAGNOSIS °Lab tests or other studies may be needed to find the cause of your pain. Your health care provider may have you take a test called an ambulatory ECG (electrocardiogram). An ECG records your heartbeat patterns at the time the test is performed. You may also have other tests, such as: °· Transthoracic echocardiogram (TTE). During echocardiography, sound waves are used to create a picture of all of the heart structures and to look at how blood flows through your heart. °· Transesophageal echocardiogram (TEE). This is a more advanced imaging test that obtains images from inside your body. It allows your health care provider to see your heart in finer detail. °· Cardiac monitoring. This allows your health care provider to monitor your heart rate and rhythm in real time. °· Holter monitor. This is a portable device that records your heartbeat and can help to diagnose abnormal heartbeats. It allows your health care provider to track your heart activity for several days, if needed. °· Stress tests. These can be done through exercise or by taking medicine that makes your heart beat more quickly. °· Blood tests. °· Imaging tests. °TREATMENT  °Your treatment depends on what is causing your chest pain. Treatment may include: °· Medicines. These may include: °¨ Acid blockers for heartburn. °¨ Anti-inflammatory medicine. °¨ Pain medicine for inflammatory conditions. °¨ Antibiotic medicine, if an infection is present. °¨ Medicines to dissolve blood clots. °¨ Medicines to treat coronary artery disease. °· Supportive care for conditions that do not require medicines. This may include: °¨ Resting. °¨ Applying heat   or cold packs to injured areas. °¨ Limiting activities until pain decreases. °HOME CARE INSTRUCTIONS °· If you were prescribed an antibiotic medicine, finish it all even if you start to feel better. °· Avoid any activities that bring on chest pain. °· Do not use any tobacco products, including  cigarettes, chewing tobacco, or electronic cigarettes. If you need help quitting, ask your health care provider. °· Do not drink alcohol. °· Take medicines only as directed by your health care provider. °· Keep all follow-up visits as directed by your health care provider. This is important. This includes any further testing if your chest pain does not go away. °· If heartburn is the cause for your chest pain, you may be told to keep your head raised (elevated) while sleeping. This reduces the chance that acid will go from your stomach into your esophagus. °· Make lifestyle changes as directed by your health care provider. These may include: °¨ Getting regular exercise. Ask your health care provider to suggest some activities that are safe for you. °¨ Eating a heart-healthy diet. A registered dietitian can help you to learn healthy eating options. °¨ Maintaining a healthy weight. °¨ Managing diabetes, if necessary. °¨ Reducing stress. °SEEK MEDICAL CARE IF: °· Your chest pain does not go away after treatment. °· You have a rash with blisters on your chest. °· You have a fever. °SEEK IMMEDIATE MEDICAL CARE IF:  °· Your chest pain is worse. °· You have an increasing cough, or you cough up blood. °· You have severe abdominal pain. °· You have severe weakness. °· You faint. °· You have chills. °· You have sudden, unexplained chest discomfort. °· You have sudden, unexplained discomfort in your arms, back, neck, or jaw. °· You have shortness of breath at any time. °· You suddenly start to sweat, or your skin gets clammy. °· You feel nauseous or you vomit. °· You suddenly feel light-headed or dizzy. °· Your heart begins to beat quickly, or it feels like it is skipping beats. °These symptoms may represent a serious problem that is an emergency. Do not wait to see if the symptoms will go away. Get medical help right away. Call your local emergency services (911 in the [place] Do not drive yourself to the hospital. °  °This  information is not intended to replace advice given to you by your health care provider. Make sure you discuss any questions you have with your health care provider. °  °Document Released: 07/10/2005 Document Revised: 10/21/2014 Document Reviewed: 05/06/2014 °Elsevier Interactive Patient Education ©2016 Else[employer] Inc. ° °

## 2015-09-11 ENCOUNTER — Ambulatory Visit (INDEPENDENT_AMBULATORY_CARE_PROVIDER_SITE_OTHER): Payer: BLUE CROSS/BLUE SHIELD | Admitting: Internal Medicine

## 2015-09-11 ENCOUNTER — Encounter: Payer: Self-pay | Admitting: Internal Medicine

## 2015-09-11 ENCOUNTER — Other Ambulatory Visit (INDEPENDENT_AMBULATORY_CARE_PROVIDER_SITE_OTHER): Payer: BLUE CROSS/BLUE SHIELD

## 2015-09-11 VITALS — BP 140/90 | HR 87 | Temp 98.5°F | Resp 20 | Ht 64.0 in | Wt 167.5 lb

## 2015-09-11 DIAGNOSIS — F410 Panic disorder [episodic paroxysmal anxiety] without agoraphobia: Secondary | ICD-10-CM | POA: Diagnosis not present

## 2015-09-11 DIAGNOSIS — R0789 Other chest pain: Secondary | ICD-10-CM

## 2015-09-11 DIAGNOSIS — F411 Generalized anxiety disorder: Secondary | ICD-10-CM

## 2015-09-11 DIAGNOSIS — I10 Essential (primary) hypertension: Secondary | ICD-10-CM

## 2015-09-11 DIAGNOSIS — R079 Chest pain, unspecified: Secondary | ICD-10-CM | POA: Insufficient documentation

## 2015-09-11 LAB — COMPREHENSIVE METABOLIC PANEL
ALT: 31 U/L (ref 0–35)
AST: 32 U/L (ref 0–37)
Albumin: 4.3 g/dL (ref 3.5–5.2)
Alkaline Phosphatase: 59 U/L (ref 39–117)
BUN: 6 mg/dL (ref 6–23)
CALCIUM: 9.8 mg/dL (ref 8.4–10.5)
CHLORIDE: 100 meq/L (ref 96–112)
CO2: 25 meq/L (ref 19–32)
CREATININE: 0.59 mg/dL (ref 0.40–1.20)
GFR: 120.28 mL/min (ref >60.00)
Glucose, Bld: 89 mg/dL (ref 70–99)
POTASSIUM: 3.8 meq/L (ref 3.5–5.1)
SODIUM: 136 meq/L (ref 135–145)
Total Bilirubin: 0.5 mg/dL (ref 0.2–1.2)
Total Protein: 7.3 g/dL (ref 6.0–8.3)

## 2015-09-11 LAB — TROPONIN I: TNIDX: 0.01 ug/L (ref 0.00–0.06)

## 2015-09-11 LAB — TSH: TSH: 1.56 u[IU]/mL (ref 0.35–4.50)

## 2015-09-11 MED ORDER — HYDROCHLOROTHIAZIDE 25 MG PO TABS: 25.0000 mg | ORAL_TABLET | Freq: Every day | ORAL | Status: DC

## 2015-09-11 MED ORDER — SERTRALINE HCL 50 MG PO TABS: 50.0000 mg | ORAL_TABLET | Freq: Every day | ORAL | Status: DC

## 2015-09-11 MED ORDER — CLONAZEPAM 0.5 MG PO TABS: 0.5000 mg | ORAL_TABLET | Freq: Two times a day (BID) | ORAL | Status: DC | PRN

## 2015-09-11 NOTE — Progress Notes (Signed)
Pre visit review using our clinic review tool, if applicable. No additional management support is needed unless otherwise documented below in the visit note. 

## 2015-09-11 NOTE — Patient Instructions (Addendum)
  We have reviewed your prior records including labs and tests today.  Test(s) ordered today. Your results will be released to MyChart (or called to you) after review, usually within 72hours after test completion. If any changes need to be made, you will be notified at that same time.   Medications reviewed and updated. Stop the wellbutrin and lisinopril-hctz.  Continue the amlodipine.  Start the hctz 25 mg daily.   Start sertraline daily.    Your prescription(s) have been submitted to your pharmacy. Please take as directed and contact our office if you believe you are having problem(s) with the medication(s).  Monitor your blood pressure daily.     Please schedule followup in 2 weeks

## 2015-09-11 NOTE — Assessment & Plan Note (Signed)
Blood work and EKG reviewed Will check second troponin but likely neg Likely related to elevated BP and anxiety Stressed quitting smoking/alcohol Stressed getting BP controlled

## 2015-09-11 NOTE — Progress Notes (Signed)
Subjective:    Patient ID: Michelle Myers, female    DOB: 01-20-1976, 39 y.o.   MRN: [id number]  HPI She is here to follow up from the ED.  Two weeks ago she was changed from lisinopril to norvasc.  The bp started to get more elevated as the days passed since the change.  On day 9 she had swollen ankles. She developed headaches. She never experienced shortness of breath. On day 10 she started having chest tightness.  It was constant tightness that lasted for a few hours. Two days later she had chest tightness and her bp was 180/?Marland Kitchen  The chest pain was a constant tightness with occasional sharp pain. She was having headaches. She went to the emergency room. She did have initial blood work, but did not want to stay for the second troponin and deferred a chest x-ray.  She does have issues with anxiety and she was anxious to. She is unsure how much that contributed to her symptoms.      She smokes 1.25 packs per day.  She has also cut done-today she is already smoked less than she usually would.  She drinks 1.5 bottles of wine a night.  Her daughter was treated for cancer and she increased her drinking then.  Last night she did not drink any alcohol and she plans on quitting. She knows she needs to make lifestyle changes. She is currently not exercising. She has not been compliant with a low sodium diet. Over the past 2 years she has gained 27 pounds.  Anxiety: She was on sertraline and she felt that helps better with her anxiety. She was placed on Wellbutrin to help her quit smoking, but that has not helped and she wonders about discontinuing it. She takes the clonazepam only as needed and no she has not taken around when she is drinking. She only takes it on occasion. He  Medications and allergies reviewed with patient and updated if appropriate.  Patient Active Problem List   Diagnosis Date Noted  . RHINITIS MEDICAMENTOSA 03/20/2010  . Anxiety state 03/08/2010  . TOBACCO USE 03/08/2010  .  Essential hypertension 03/08/2010  . IBS 03/08/2010    Current Outpatient Prescriptions on File Prior to Visit  Medication Sig Dispense Refill  . amLODipine (NORVASC) 5 MG tablet Take 1 tablet (5 mg total) by mouth daily. 30 tablet 5  . aspirin 81 MG tablet Take 81 mg by mouth daily.    Marland Kitchen buPROPion (WELLBUTRIN SR) 150 MG 12 hr tablet Take 150 mg by mouth daily.    . clonazePAM (KLONOPIN) 0.5 MG tablet Take 1 tablet (0.5 mg total) by mouth 2 (two) times daily as needed for anxiety. 30 tablet 0  . mometasone (ELOCON) 0.1 % ointment Apply bid 45 g 0  . norethindrone (CAMILA) 0.35 MG tablet Take 1 tablet by mouth daily.     No current facility-administered medications on file prior to visit.    Past Medical History  Diagnosis Date  . Hypertension   . Anxiety     Past Surgical History  Procedure Laterality Date  . Wisdom tooth extraction    . Cervical cone biopsy      for HPV    Social History   Social History  . Marital Status: Married    Spouse Name: N/A  . Number of Children: N/A  . Years of Education: N/A   Social History Main Topics  . Smoking status: Current Every Day Smoker -- 1.00  packs/day    Types: Cigarettes  . Smokeless tobacco: Never Used     Comment: smoked 1992-present , up to 1.5 ppd  . Alcohol Use: Yes     Comment: 1.5 bottle of wine / night  . Drug Use: No  . Sexual Activity: Yes    Birth Control/ Protection: Pill   Other Topics Concern  . None   Social History Narrative    Review of Systems  Constitutional: Negative for fever and chills.  HENT: Negative for congestion, ear pain and sore throat.   Respiratory: Negative for choking, shortness of breath and wheezing.   Cardiovascular: Positive for chest pain and leg swelling. Negative for palpitations.  Gastrointestinal: Negative for nausea and abdominal pain.       No GERD  Neurological: Negative for dizziness, light-headedness and headaches.  Psychiatric/Behavioral: Negative for dysphoric  mood. The patient is nervous/anxious.        Objective:   Filed Vitals:   09/11/15 1505  BP: 140/90  Pulse: 87  Temp: 98.5 F (36.9 C)  Resp: 20   Filed Weights   09/11/15 1505  Weight: 167 lb 8 oz (75.978 kg)    BP Readings from Last 3 Encounters:  09/11/15 140/90  09/10/15 157/95  08/29/15 120/80    Body mass index is 28.74 kg/(m^2).   Physical Exam Constitutional: Appears well-developed and well-nourished. No distress.  Neck: Neck supple. No tracheal deviation present. No thyromegaly present.  No carotid bruit. No cervical adenopathy.   Cardiovascular: Normal rate, regular rhythm and normal heart sounds.   1/6 systolic murmur. Pulmonary/Chest: Effort normal and breath sounds normal. No respiratory distress. No wheezes.  Musculoskeletal: No edema.  Psych: anxious        Assessment & Plan:   See Problem List.   Follow up in 2 weeks

## 2015-09-11 NOTE — Assessment & Plan Note (Signed)
Blood pressure not controlled Lisinopril was discontinued for possible edema , so we will not restart it Continue Norvasc 5 mg daily  Start hydrochlorothiazid 25 mg daily Her cuff was checked today and it is accurate -she will continue to monitor on a daily basis Stressed smoking  cesation and alcohol abstinence  start regular exercise Weight loss Low-sodium diet Follow up in 2 weeks sooner if needed

## 2015-09-11 NOTE — Assessment & Plan Note (Signed)
Not controlled D/c wellbutrin Start sertraline 50 mg daily Clonazepam only as needed - will try to wean off once other medical problems controlled and sertraline titrated

## 2015-09-12 ENCOUNTER — Encounter: Payer: Self-pay | Admitting: Internal Medicine

## 2015-09-18 ENCOUNTER — Other Ambulatory Visit: Payer: Self-pay | Admitting: Internal Medicine

## 2015-09-18 NOTE — Telephone Encounter (Signed)
Pt called in and said that he her clonazePAM (KLONOPIN) 0.5 MG tablet [[id number]][[id number]]  Was never called in.  Can you call pharmacy and see what is going on?

## 2015-09-18 NOTE — Telephone Encounter (Signed)
Md is out of office pls advise on refill.../lmb 

## 2015-09-19 NOTE — Telephone Encounter (Signed)
Rx was faxed by Ladona Ridgelaylor this am.../lmb

## 2015-09-22 ENCOUNTER — Encounter: Payer: Self-pay | Admitting: Internal Medicine

## 2015-09-22 ENCOUNTER — Ambulatory Visit (INDEPENDENT_AMBULATORY_CARE_PROVIDER_SITE_OTHER): Payer: BLUE CROSS/BLUE SHIELD | Admitting: Internal Medicine

## 2015-09-22 VITALS — BP 140/105 | HR 92 | Temp 98.1°F | Ht 64.0 in | Wt 158.0 lb

## 2015-09-22 DIAGNOSIS — F430 Acute stress reaction: Secondary | ICD-10-CM

## 2015-09-22 DIAGNOSIS — I10 Essential (primary) hypertension: Secondary | ICD-10-CM

## 2015-09-22 DIAGNOSIS — F419 Anxiety disorder, unspecified: Secondary | ICD-10-CM

## 2015-09-22 DIAGNOSIS — R0789 Other chest pain: Secondary | ICD-10-CM

## 2015-09-22 DIAGNOSIS — F411 Generalized anxiety disorder: Secondary | ICD-10-CM

## 2015-09-22 MED ORDER — CARVEDILOL 6.25 MG PO TABS: ORAL_TABLET | ORAL | Status: DC

## 2015-09-22 NOTE — Patient Instructions (Signed)
Minimal Blood Pressure Goal= AVERAGE < 140/90;  Ideal is an AVERAGE < 135/85. This AVERAGE should be calculated from @ least 5-7 BP readings taken @ different times of day on different days of week. You should not respond to isolated BP readings , but rather the AVERAGE for that week .Please bring your  blood pressure cuff to office visits to verify that it is reliable.It  can also be checked against the blood pressure device at the pharmacy. Finger or wrist cuffs are not dependable; an arm cuff is. The carvedilol will be titrated up to a maximum of 25 mg twice a day if the blood pressure  does not average less than 140/90. The increase in dose can be initiated every 5 days. Please collect ALL urine over a 24 period beginning early in the morning and bring the sample to the lab after completing the full 24-hour collection. This is to look for causes of resistant hypertension.

## 2015-09-22 NOTE — Progress Notes (Signed)
Pre visit review using our clinic review tool, if applicable. No additional management support is needed unless otherwise documented below in the visit note. 

## 2015-09-22 NOTE — Progress Notes (Signed)
   Subjective:    Patient ID: Michelle Myers, female    DOB: August 02, 1976, 39 y.o.   MRN: [id number]  HPI  She has been compliant with her medications. She feels that her blood pressures is definitely climbing since the lisinopril was discontinued. This was done because of possible angioedema of the lips. She also had some increased ankle edema on the amlodipine. That has improved on a thiazide diuretic. In the last 10 days she's decreased her salt intake dramatically and does not add at the table. She's lost 7 pounds with this maneuver. HCTZ was prescribed 10 days ago.  She has decreased her smoking to 11 cigarettes a day. She's been afraid to exercise because of elevated BP.  She did express chest tightness on day 12 of amlodipine and found her blood pressure to be as high as 183/122. She was seen at the urgent care for this 11/27. Those records were reviewed. Troponin was less than 0.03. Glucose was minimally elevated at 101. Otherwise extensive chemistries, liver function tests, and CBC were normal. TSH was therapeutic at 1.56.  On the HCTZ and amlodipine her blood pressure ranges 15 4/115-162/118.  She's had no recurrent cardio pulmonary symptoms since the urgent care visit  She is under great deal of stress. [place] [employer] part of the [employer]. [facility]. Chemotherapy has been discontinued.  Review of Systems  She denies a constellation of headache, flushing, chest pain, diarrhea.  Chest pain, palpitations, tachycardia, exertional dyspnea, paroxysmal nocturnal dyspnea, claudication or edema are absent @ present.     Objective:   Physical Exam  Repeat blood pressure was 140/105. She exhibits an S4; pulse did drop to 88. She has slightly decreased breath sounds. Appears healthy and well-nourished & in no acute distress  No carotid bruits are present.No neck vein distention  present at 10 - 15 degrees. Thyroid normal to palpation  Heart rhythm and rate are normal with no gallop or murmur  Chest is clear with no increased work of breathing  There is no evidence of aortic aneurysm or renal artery bruits  Abdomen soft with no organomegaly or masses. No HJR  No clubbing, cyanosis or edema present.  Pedal pulses are intact   No ischemic skin changes are present . Fingernails/ toenails healthy   Alert and oriented. Anxious and intermittently tearful.  Strength, tone, DTRs reflexes normal     Assessment & Plan:  #1 uncontrolled hypertension  #2 profound exogenous stress related to her daughter's serious illnesses.  Plan: See orders and recommendations.

## 2015-10-13 ENCOUNTER — Other Ambulatory Visit: Payer: Self-pay | Admitting: Internal Medicine

## 2015-10-13 NOTE — Telephone Encounter (Signed)
OK X1 

## 2015-10-13 NOTE — Telephone Encounter (Signed)
Pt called to check up on this request, she is trying to get it before she going out of town on Monday for 6 days. Please help

## 2015-10-14 ENCOUNTER — Ambulatory Visit (INDEPENDENT_AMBULATORY_CARE_PROVIDER_SITE_OTHER): Payer: BLUE CROSS/BLUE SHIELD | Admitting: Family Medicine

## 2015-10-14 ENCOUNTER — Encounter: Payer: Self-pay | Admitting: Family Medicine

## 2015-10-14 VITALS — BP 138/100 | HR 101 | Temp 98.4°F

## 2015-10-14 DIAGNOSIS — N3000 Acute cystitis without hematuria: Secondary | ICD-10-CM

## 2015-10-14 DIAGNOSIS — F17200 Nicotine dependence, unspecified, uncomplicated: Secondary | ICD-10-CM | POA: Diagnosis not present

## 2015-10-14 DIAGNOSIS — I10 Essential (primary) hypertension: Secondary | ICD-10-CM | POA: Diagnosis not present

## 2015-10-14 DIAGNOSIS — N390 Urinary tract infection, site not specified: Secondary | ICD-10-CM | POA: Insufficient documentation

## 2015-10-14 DIAGNOSIS — R300 Dysuria: Secondary | ICD-10-CM | POA: Diagnosis not present

## 2015-10-14 LAB — POCT URINALYSIS DIPSTICK
BILIRUBIN UA: NEGATIVE
Blood, UA: POSITIVE
GLUCOSE UA: NEGATIVE
KETONES UA: NEGATIVE
Nitrite, UA: NEGATIVE
Protein, UA: POSITIVE
SPEC GRAV UA: 1.025
Urobilinogen, UA: 0.2
pH, UA: 6

## 2015-10-14 MED ORDER — CIPROFLOXACIN HCL 250 MG PO TABS: 250.0000 mg | ORAL_TABLET | Freq: Two times a day (BID) | ORAL | Status: DC

## 2015-10-14 NOTE — Progress Notes (Signed)
BP 138/100 mmHg  Pulse 101  Temp(Src) 98.4 F (36.9 C) (Oral)  SpO2 98%   CC: UTI?  Subjective:    Patient ID: Michelle Myers, female    DOB: 03/19/1976, 39 y.o.   MRN: [id number]  HPI: Michelle Myers is a 39 y.o. female presenting on 10/14/2015 for No chief complaint on file.   1d h/o urinary urgency, dysuria, incomplete emptying.  No fevers/chills, abd pain, flank pain, nausea, hematuria.   Current smoker. No recent UTI. No recent abx use.   Working on quitting smoking.   Planning trip to [place] with [employer] for daughter who just completed chemo.   Relevant past medical, surgical, family and social history reviewed and updated as indicated. Interim medical history since our last visit reviewed. Allergies and medications reviewed and updated. Current Outpatient Prescriptions on File Prior to Visit  Medication Sig  . amLODipine (NORVASC) 5 MG tablet Take 1 tablet (5 mg total) by mouth daily.  Marland Kitchen aspirin 81 MG tablet Take 81 mg by mouth daily.  . carvedilol (COREG) 6.25 MG tablet 1/2 bid ; titrate as directed  . clonazePAM (KLONOPIN) 0.5 MG tablet TAKE 1 TABLET BY MOUTH TWICE A DAY AS NEEDED  . hydrochlorothiazide (HYDRODIURIL) 25 MG tablet Take 1 tablet (25 mg total) by mouth daily.  . mometasone (ELOCON) 0.1 % ointment Apply bid  . norethindrone (CAMILA) 0.35 MG tablet Take 1 tablet by mouth daily.  . sertraline (ZOLOFT) 50 MG tablet Take 1 tablet (50 mg total) by mouth daily.   No current facility-administered medications on file prior to visit.    Review of Systems Per HPI unless specifically indicated in ROS section     Objective:    BP 138/100 mmHg  Pulse 101  Temp(Src) 98.4 F (36.9 C) (Oral)  SpO2 98%  Wt Readings from Last 3 Encounters:  09/22/15 158 lb (71.668 kg)  09/11/15 167 lb 8 oz (75.978 kg)  09/10/15 165 lb (74.844 kg)    Physical Exam  Constitutional: She appears well-developed and well-nourished. No distress.  Abdominal:  Soft. Normal appearance and bowel sounds are normal. She exhibits no distension and no mass. There is no hepatosplenomegaly. There is tenderness (mild pressure) in the suprapubic area. There is no rigidity, no rebound, no guarding, no CVA tenderness and negative Murphy's sign.  Nursing note and vitals reviewed.  Results for orders placed or performed in visit on 10/14/15  POCT urinalysis dipstick  Result Value Ref Range   Color, UA brown    Clarity, UA cloudy    Glucose, UA negative    Bilirubin, UA negative    Ketones, UA negative    Spec Grav, UA 1.025    Blood, UA positive    pH, UA 6.0    Protein, UA positive    Urobilinogen, UA 0.2    Nitrite, UA negative    Leukocytes, UA small (1+) (A) Negative      Assessment & Plan:   Problem List Items Addressed This Visit    UTI (urinary tract infection) - Primary    UA and story today consistent with UTI - treat with cipro  BID 3d course. Update if sxs persist. UCx not sent today.      TOBACCO USE    Planning on quitting 2017.      Essential hypertension    Pt working on better control - states improving #s.       Other Visit Diagnoses    Dysuria  Relevant Orders    POCT urinalysis dipstick (Completed)        Follow up plan: Return if symptoms worsen or fail to improve.

## 2015-10-14 NOTE — Assessment & Plan Note (Signed)
Pt working on better control - states improving #s.

## 2015-10-14 NOTE — Progress Notes (Signed)
Pre visit review using our clinic review tool, if applicable. No additional management support is needed unless otherwise documented below in the visit note. 

## 2015-10-14 NOTE — Assessment & Plan Note (Signed)
UA and story today consistent with UTI - treat with cipro 250mg  BID 3d course. Update if sxs persist. UCx not sent today.

## 2015-10-14 NOTE — Assessment & Plan Note (Signed)
Planning on quitting 2017.

## 2015-10-14 NOTE — Patient Instructions (Signed)
Take cipro twice daily for 3 days. Push fluids and rest. Cranberry juice or cranberry tablets.   Urinary Tract Infection Urinary tract infections (UTIs) can develop anywhere along your urinary tract. Your urinary tract is your body's drainage system for removing wastes and extra water. Your urinary tract includes two kidneys, two ureters, a bladder, and a urethra. Your kidneys are a pair of bean-shaped organs. Each kidney is about the size of your fist. They are located below your ribs, one on each side of your spine. CAUSES Infections are caused by microbes, which are microscopic organisms, including fungi, viruses, and bacteria. These organisms are so small that they can only be seen through a microscope. Bacteria are the microbes that most commonly cause UTIs. SYMPTOMS  Symptoms of UTIs may vary by age and gender of the patient and by the location of the infection. Symptoms in young women typically include a frequent and intense urge to urinate and a painful, burning feeling in the bladder or urethra during urination. Older women and men are more likely to be tired, shaky, and weak and have muscle aches and abdominal pain. A fever may mean the infection is in your kidneys. Other symptoms of a kidney infection include pain in your back or sides below the ribs, nausea, and vomiting. DIAGNOSIS To diagnose a UTI, your caregiver will ask you about your symptoms. Your caregiver will also ask you to provide a urine sample. The urine sample will be tested for bacteria and white blood cells. White blood cells are made by your body to help fight infection. TREATMENT  Typically, UTIs can be treated with medication. Because most UTIs are caused by a bacterial infection, they usually can be treated with the use of antibiotics. The choice of antibiotic and length of treatment depend on your symptoms and the type of bacteria causing your infection. HOME CARE INSTRUCTIONS  If you were prescribed antibiotics, take  them exactly as your caregiver instructs you. Finish the medication even if you feel better after you have only taken some of the medication.  Drink enough water and fluids to keep your urine clear or pale yellow.  Avoid caffeine, tea, and carbonated beverages. They tend to irritate your bladder.  Empty your bladder often. Avoid holding urine for long periods of time.  Empty your bladder before and after sexual intercourse.  After a bowel movement, women should cleanse from front to back. Use each tissue only once. SEEK MEDICAL CARE IF:   You have back pain.  You develop a fever.  Your symptoms do not begin to resolve within 3 days. SEEK IMMEDIATE MEDICAL CARE IF:   You have severe back pain or lower abdominal pain.  You develop chills.  You have nausea or vomiting.  You have continued burning or discomfort with urination. MAKE SURE YOU:   Understand these instructions.  Will watch your condition.  Will get help right away if you are not doing well or get worse.   This information is not intended to replace advice given to you by your health care provider. Make sure you discuss any questions you have with your health care provider.   Document Released: 07/10/2005 Document Revised: 06/21/2015 Document Reviewed: 11/08/2011 Elsevier Interactive Patient Education [employer]2016 [employer] Inc.

## 2015-10-18 ENCOUNTER — Telehealth: Payer: Self-pay | Admitting: Internal Medicine

## 2015-10-18 MED ORDER — NITROFURANTOIN MONOHYD MACRO 100 MG PO CAPS: 100.0000 mg | ORAL_CAPSULE | Freq: Two times a day (BID) | ORAL | Status: DC

## 2015-10-18 MED ORDER — PHENAZOPYRIDINE HCL 200 MG PO TABS: 200.0000 mg | ORAL_TABLET | Freq: Three times a day (TID) | ORAL | Status: DC | PRN

## 2015-10-18 NOTE — Telephone Encounter (Signed)
Spoke with pt to inform.  

## 2015-10-18 NOTE — Telephone Encounter (Signed)
Pt seen Dr. Sharen HonesGutierrez on Saturday and was given some antibiotic. [place] [employer] [place] trip and she is in a lot of pain.  She is hoping you call her in something. She can be reached at [phone] Pharmacy is [facility] on [address] [place], [place]

## 2015-10-18 NOTE — Telephone Encounter (Signed)
Antibiotic sent (stop cipro if still taking).  Pyridium also sent to help with pain.

## 2015-10-18 NOTE — Telephone Encounter (Signed)
Pt is stating the antibiotic that was given Saturday does not seem to be helping. Please advise.

## 2015-11-22 ENCOUNTER — Other Ambulatory Visit: Payer: Self-pay | Admitting: Internal Medicine

## 2015-11-22 ENCOUNTER — Telehealth: Payer: Self-pay | Admitting: Emergency Medicine

## 2015-11-22 NOTE — Telephone Encounter (Signed)
Faxed Clonazepam to [facility]

## 2015-12-22 ENCOUNTER — Ambulatory Visit (INDEPENDENT_AMBULATORY_CARE_PROVIDER_SITE_OTHER): Payer: BLUE CROSS/BLUE SHIELD | Admitting: Family Medicine

## 2015-12-22 ENCOUNTER — Encounter: Payer: Self-pay | Admitting: Family Medicine

## 2015-12-22 VITALS — BP 169/131 | HR 83 | Temp 98.8°F | Resp 16 | Ht 64.25 in | Wt 148.8 lb

## 2015-12-22 DIAGNOSIS — F411 Generalized anxiety disorder: Secondary | ICD-10-CM

## 2015-12-22 DIAGNOSIS — I10 Essential (primary) hypertension: Secondary | ICD-10-CM

## 2015-12-22 MED ORDER — CLONAZEPAM 0.5 MG PO TABS: 0.5000 mg | ORAL_TABLET | Freq: Two times a day (BID) | ORAL | Status: DC | PRN

## 2015-12-22 MED ORDER — CARVEDILOL 25 MG PO TABS: 25.0000 mg | ORAL_TABLET | Freq: Two times a day (BID) | ORAL | Status: DC

## 2015-12-22 MED ORDER — HYDROCHLOROTHIAZIDE 25 MG PO TABS: 25.0000 mg | ORAL_TABLET | Freq: Every day | ORAL | Status: DC

## 2015-12-22 NOTE — Progress Notes (Signed)
Pre visit review using our clinic review tool, if applicable. No additional management support is needed unless otherwise documented below in the visit note. 

## 2015-12-22 NOTE — Progress Notes (Signed)
Office Note 12/22/2015  CC:  Chief Complaint  Patient presents with  . Establish Care    transfer from Dr. Alwyn Ren. Pt is not fasting.    HPI:  Michelle Myers is a 40 y.o. White female who is establish/transer care. Patient's most recent primary MD: Dr. Alwyn Ren (retired). Old records in [facility] EMR were reviewed prior to or during today's visit.  Her main issue the last several months has been uncontrolled HTN. Has been hard to control since she had to get off her ACE-I due to suspected angioedema.   Tolerating current regimen w/out side effect, has changed diet to low sodium and lost >10 lbs doing this.  She used to monitor her bp at home but quit doing this a couple months ago b/c of anxiety about seeing the high numbers.  She admits she drinks too much but insists she is still able to function normally and be a good parent to her children.  She has a daughter ill with NF-type 1 and she recently had to go through some chemo treatments for masses in optic area and brain.  ROS: face/ears flushing in late afternoons.  Denies HAs or vision problems.  No focal weakness.  No CP or SOB. She exercises several days per week.  Past Medical History  Diagnosis Date  . Hypertension   . Anxiety   . Tobacco dependence     Past Surgical History  Procedure Laterality Date  . Wisdom tooth extraction    . Cervical cone biopsy      for HPV  . Cataract extraction w/ intraocular lens  implant, bilateral  2008 and 2010    Family History  Problem Relation Age of Onset  . Prostate cancer Father   . Hypertension Father   . Brain cancer Daughter     neurofibromatosis, optic neurogliomas  . Diabetes Maternal Uncle   . Heart disease Maternal Grandmother     CBAG in late 26s  . Hypertension Maternal Grandmother   . Heart attack Maternal Grandfather 60  . Stroke Neg Hx   . Arthritis Mother   . Hypertension Mother   . Alcohol abuse Brother     half brother, maternal  . Mental illness  Paternal Grandmother   . Heart disease Paternal Grandfather   . Heart attack Brother 17    half brother, maternal    Social History   Social History  . Marital Status: Married    Spouse Name: N/A  . Number of Children: N/A  . Years of Education: N/A   Occupational History  . Not on file.   Social History Main Topics  . Smoking status: Current Every Day Smoker -- 1.00 packs/day for 24 years    Types: Cigarettes  . Smokeless tobacco: Never Used     Comment: smoked 1992-present , up to 1.5 ppd  . Alcohol Use: 0.0 oz/week    0 Standard drinks or equivalent per week     Comment: 1.5 bottle of wine / night  . Drug Use: No  . Sexual Activity: Yes    Birth Control/ Protection: Pill   Other Topics Concern  . Not on file   Social History Narrative   Married, 2 daughters.   Educ: Bachelor's degree   Occupation: Stay at home mom.   Youngest daughter has NF type 1 (age 9 as of 12/2015)   Tob: 24 pack-yr hx.   Alc: 1 and 1/2 bottles of wine per night.       Outpatient  Encounter Prescriptions as of 12/22/2015  Medication Sig  . amLODipine (NORVASC) 5 MG tablet Take 1 tablet (5 mg total) by mouth daily.  Marland Kitchen aspirin 81 MG tablet Take 81 mg by mouth daily.  . clonazePAM (KLONOPIN) 0.5 MG tablet Take 1 tablet (0.5 mg total) by mouth 2 (two) times daily as needed.  . hydrochlorothiazide (HYDRODIURIL) 25 MG tablet Take 1 tablet (25 mg total) by mouth daily.  . mometasone (ELOCON) 0.1 % ointment Apply bid  . norethindrone (CAMILA) 0.35 MG tablet Take 1 tablet by mouth daily.  . sertraline (ZOLOFT) 50 MG tablet Take 1 tablet (50 mg total) by mouth daily.  . [DISCONTINUED] carvedilol (COREG) 6.25 MG tablet 1/2 bid ; titrate as directed (Patient taking differently: Take 6.25 mg by mouth 2 (two) times daily with a meal. )  . [DISCONTINUED] clonazePAM (KLONOPIN) 0.5 MG tablet TAKE ONE TABLET BY MOUTH TWICE A DAY AS NEEDED  . [DISCONTINUED] hydrochlorothiazide (HYDRODIURIL) 25 MG tablet Take 1  tablet (25 mg total) by mouth daily.  . carvedilol (COREG) 25 MG tablet Take 1 tablet (25 mg total) by mouth 2 (two) times daily with a meal.  . [DISCONTINUED] ciprofloxacin (CIPRO) 250 MG tablet Take 1 tablet (250 mg total) by mouth 2 (two) times daily. (Patient not taking: Reported on 12/22/2015)  . [DISCONTINUED] nitrofurantoin, macrocrystal-monohydrate, (MACROBID) 100 MG capsule Take 1 capsule (100 mg total) by mouth 2 (two) times daily. (Patient not taking: Reported on 12/22/2015)  . [DISCONTINUED] phenazopyridine (PYRIDIUM) 200 MG tablet Take 1 tablet (200 mg total) by mouth 3 (three) times daily as needed for pain (with urination). (Patient not taking: Reported on 12/22/2015)   No facility-administered encounter medications on file as of 12/22/2015.    Allergies  Allergen Reactions  . Lisinopril     Angioedema  . Sulfa Antibiotics      ? reaction    ROS Review of Systems  Constitutional: Negative for fever and fatigue.  HENT: Negative for congestion and sore throat.   Eyes: Negative for visual disturbance.  Respiratory: Negative for cough.   Cardiovascular: Negative for chest pain.  Gastrointestinal: Negative for nausea and abdominal pain.  Genitourinary: Negative for dysuria.  Musculoskeletal: Negative for back pain and joint swelling.  Skin: Negative for rash.  Neurological: Negative for weakness and headaches.  Hematological: Negative for adenopathy.  Psychiatric/Behavioral: The patient is nervous/anxious.     PE; Blood pressure 169/131, pulse 83, temperature 98.8 F (37.1 C), temperature source Oral, resp. rate 16, height 5' 4.25" (1.632 m), weight 148 lb 12 oz (67.473 kg), last menstrual period 12/09/2015, SpO2 98 %. Gen: Alert, well appearing.  Patient is oriented to person, place, time, and situation. AFFECT: pleasant, lucid thought and speech.  Teared up a few times when discussing her daughter and when discussing how much she drinks [id number]: no injection, icteris,  swelling, or exudate.  EOMI, PERRLA. Mouth: lips without lesion/swelling.  Oral mucosa pink and moist. Oropharynx without erythema, exudate, or swelling.  Neck - No masses or thyromegaly or limitation in range of motion CV: RRR, no m/r/g.   LUNGS: CTA bilat, nonlabored resps, good aeration in all lung fields. EXT: no clubbing, cyanosis, or edema.   Pertinent labs:  None today    Chemistry      Component Value Date/Time   NA 136 09/11/2015 1608   K 3.8 09/11/2015 1608   CL 100 09/11/2015 1608   CO2 25 09/11/2015 1608   BUN 6 09/11/2015 1608   CREATININE 0.59  09/11/2015 1608      Component Value Date/Time   CALCIUM 9.8 09/11/2015 1608   ALKPHOS 59 09/11/2015 1608   AST 32 09/11/2015 1608   ALT 31 09/11/2015 1608   BILITOT 0.5 09/11/2015 1608      ASSESSMENT AND PLAN:   Transfer pt/establish care.  1) Uncontrolled HTN: seemed to be well controlled until she had to get off of her ACE-I. Will continue current regimen of amlod 5mg  and hctz 25mg  but will titrate coreg to 12.5mg  bid (using the rest of her 6.25mg  tab bottle) and then get her to 25mg  bid Coreg after a few days--rx sent. Encouraged her to slowly cut back on smoking and drinking. Monitor bp at home 1-2 times between now and next visit. Dr. Alwyn RenHopper had ordered 24h urinary catecholamines and metanephrines in Dec 2016 and pt did not do this but has the jug at home and we may still re-order this in future if we can't get bp more controlled.  Will also get renal artery dopplers at that time.    2) Anxiety, generalized + situational: she uses zoloft and clonazepam (RF'd clonaz today) but I'm afraid she is also using wine at night.  Discussed this some today, encouraged her to slowly cut back on alcohol---esp as a tool to cope with stress/anxiety.  3) Needs CPE: she'll return for fasting CPE in 1-2 weeks.  Spent 30 min with pt today, with >50% of this time spent in counseling and care coordination regarding the above  problems.  An After Visit Summary was printed and given to the patient.  Return for cpe in 1-2 weeks (fasting).

## 2015-12-22 NOTE — Patient Instructions (Signed)
Take 2 of your current coreg tabs twice a day until they are gone. Then start taking the new rx: one 25mg  tab twice a day.

## 2016-01-01 ENCOUNTER — Encounter: Payer: Self-pay | Admitting: Family Medicine

## 2016-01-01 ENCOUNTER — Ambulatory Visit (INDEPENDENT_AMBULATORY_CARE_PROVIDER_SITE_OTHER): Payer: BLUE CROSS/BLUE SHIELD | Admitting: Family Medicine

## 2016-01-01 VITALS — BP 166/102 | HR 70 | Temp 98.5°F | Ht 64.25 in | Wt 149.1 lb

## 2016-01-01 DIAGNOSIS — G4700 Insomnia, unspecified: Secondary | ICD-10-CM

## 2016-01-01 DIAGNOSIS — Z0000 Encounter for general adult medical examination without abnormal findings: Secondary | ICD-10-CM | POA: Diagnosis not present

## 2016-01-01 DIAGNOSIS — I10 Essential (primary) hypertension: Secondary | ICD-10-CM

## 2016-01-01 LAB — COMPREHENSIVE METABOLIC PANEL
ALBUMIN: 4.1 g/dL (ref 3.5–5.2)
ALK PHOS: 53 U/L (ref 39–117)
ALT: 42 U/L — ABNORMAL HIGH (ref 0–35)
AST: 40 U/L — ABNORMAL HIGH (ref 0–37)
BUN: 8 mg/dL (ref 6–23)
CALCIUM: 9.1 mg/dL (ref 8.4–10.5)
CHLORIDE: 102 meq/L (ref 96–112)
CO2: 29 mEq/L (ref 19–32)
Creatinine, Ser: 0.5 mg/dL (ref 0.40–1.20)
GFR: 145.37 mL/min (ref >60.00)
Glucose, Bld: 95 mg/dL (ref 70–99)
POTASSIUM: 4.1 meq/L (ref 3.5–5.1)
SODIUM: 137 meq/L (ref 135–145)
TOTAL PROTEIN: 6.7 g/dL (ref 6.0–8.3)
Total Bilirubin: 0.5 mg/dL (ref 0.2–1.2)

## 2016-01-01 LAB — CBC WITH DIFFERENTIAL/PLATELET
BASOS PCT: 0.4 % (ref 0.0–3.0)
Basophils Absolute: 0 10*3/uL (ref 0.0–0.1)
EOS ABS: 0.2 10*3/uL (ref 0.0–0.7)
EOS PCT: 2.6 % (ref 0.0–5.0)
HEMATOCRIT: 43.7 % (ref 36.0–46.0)
HEMOGLOBIN: 14.8 g/dL (ref 12.0–15.0)
LYMPHS PCT: 37.2 % (ref 12.0–46.0)
Lymphs Abs: 2.3 10*3/uL (ref 0.7–4.0)
MCHC: 33.8 g/dL (ref 30.0–36.0)
MCV: 97.4 fl (ref 78.0–100.0)
MONOS PCT: 6.4 % (ref 3.0–12.0)
Monocytes Absolute: 0.4 10*3/uL (ref 0.1–1.0)
NEUTROS ABS: 3.3 10*3/uL (ref 1.4–7.7)
Neutrophils Relative %: 53.4 % (ref 43.0–77.0)
PLATELETS: 189 10*3/uL (ref 150.0–400.0)
RBC: 4.48 Mil/uL (ref 3.87–5.11)
RDW: 13 % (ref 11.5–15.5)
WBC: 6.2 10*3/uL (ref 4.0–10.5)

## 2016-01-01 LAB — LIPID PANEL
CHOLESTEROL: 147 mg/dL (ref 0–200)
HDL: 49.2 mg/dL (ref >39.00)
LDL Cholesterol: 87 mg/dL (ref 0–99)
NonHDL: 97.34
TRIGLYCERIDES: 50 mg/dL (ref 0.0–149.0)
Total CHOL/HDL Ratio: 3
VLDL: 10 mg/dL (ref 0.0–40.0)

## 2016-01-01 LAB — TSH: TSH: 1.1 u[IU]/mL (ref 0.35–4.50)

## 2016-01-01 MED ORDER — TRAZODONE HCL 50 MG PO TABS: ORAL_TABLET | ORAL | Status: DC

## 2016-01-01 MED ORDER — AMLODIPINE BESYLATE 10 MG PO TABS: 10.0000 mg | ORAL_TABLET | Freq: Every day | ORAL | Status: DC

## 2016-01-01 NOTE — Progress Notes (Signed)
Office Note 01/01/2016  CC:  Chief Complaint  Patient presents with  . Annual Exam    HPI:  Michelle Myers is a 40 y.o. White female who is here for annual health maintenance exam. She feels better, no more facial/ears flushing.  No home bp checks.  Admits she got nervous when she saw bp machine today.  She is starting to cut back a tiny bit on alcohol.  She finds it hard to sleep hs and says she drinks until she can feel able to fall asleep.  She asks for a non habit-forming med to assist with initiating sleep.  Melatonin was tried in remote past--she can't recall any effect.  Several rx meds for this in the past but she cannot recall any names.     Past Medical History  Diagnosis Date  . Hypertension   . Anxiety   . Tobacco dependence     Past Surgical History  Procedure Laterality Date  . Wisdom tooth extraction    . Cervical cone biopsy      for HPV  . Cataract extraction w/ intraocular lens  implant, bilateral  2008 and 2010    Family History  Problem Relation Age of Onset  . Prostate cancer Father   . Hypertension Father   . Brain cancer Daughter     neurofibromatosis, optic neurogliomas  . Diabetes Maternal Uncle   . Heart disease Maternal Grandmother     CBAG in late 6970s  . Hypertension Maternal Grandmother   . Heart attack Maternal Grandfather 60  . Stroke Neg Hx   . Arthritis Mother   . Hypertension Mother   . Alcohol abuse Brother     half brother, maternal  . Mental illness Paternal Grandmother   . Heart disease Paternal Grandfather   . Heart attack Brother [id number]    half brother, maternal    Social History   Social History  . Marital Status: Married    Spouse Name: N/A  . Number of Children: N/A  . Years of Education: N/A   Occupational History  . Not on file.   Social History Main Topics  . Smoking status: Current Every Day Smoker -- 1.00 packs/day for 24 years    Types: Cigarettes  . Smokeless tobacco: Never Used     Comment: smoked  1992-present , up to 1.5 ppd  . Alcohol Use: 0.0 oz/week    0 Standard drinks or equivalent per week     Comment: 1.5 bottle of wine / night  . Drug Use: No  . Sexual Activity: Yes    Birth Control/ Protection: Pill   Other Topics Concern  . Not on file   Social History Narrative   Married, 2 daughters.   Educ: Bachelor's degree   Occupation: Stay at home mom.   Youngest daughter has NF type 1 (age 636 as of 12/2015)   Tob: 24 pack-yr hx.   Alc: 1 and 1/2 bottles of wine per night.       Outpatient Prescriptions Prior to Visit  Medication Sig Dispense Refill  . aspirin 81 MG tablet Take 81 mg by mouth daily.    . carvedilol (COREG) 25 MG tablet Take 1 tablet (25 mg total) by mouth 2 (two) times daily with a meal. 60 tablet 6  . clonazePAM (KLONOPIN) 0.5 MG tablet Take 1 tablet (0.5 mg total) by mouth 2 (two) times daily as needed. 30 tablet 5  . hydrochlorothiazide (HYDRODIURIL) 25 MG tablet Take 1 tablet (25  mg total) by mouth daily. 90 tablet 1  . mometasone (ELOCON) 0.1 % ointment Apply bid 45 g 0  . norethindrone (CAMILA) 0.35 MG tablet Take 1 tablet by mouth daily.    . sertraline (ZOLOFT) 50 MG tablet Take 1 tablet (50 mg total) by mouth daily. 90 tablet 1  . amLODipine (NORVASC) 5 MG tablet Take 1 tablet (5 mg total) by mouth daily. 30 tablet 5   No facility-administered medications prior to visit.    Allergies  Allergen Reactions  . Lisinopril     Angioedema  . Sulfa Antibiotics      ? reaction    ROS Review of Systems  Constitutional: Negative for fever, chills, appetite change and fatigue.  HENT: Positive for congestion and rhinorrhea. Negative for dental problem, ear pain and sore throat.        "URI for the last week" per pt  Eyes: Negative for discharge, redness and visual disturbance.  Respiratory: Negative for cough, chest tightness, shortness of breath and wheezing.   Cardiovascular: Negative for chest pain, palpitations and leg swelling.   Gastrointestinal: Negative for nausea, vomiting, abdominal pain, diarrhea and blood in stool.  Genitourinary: Negative for dysuria, urgency, frequency, hematuria, flank pain and difficulty urinating.  Musculoskeletal: Negative for myalgias, back pain, joint swelling, arthralgias and neck stiffness.  Skin: Negative for pallor and rash.  Neurological: Negative for dizziness, speech difficulty, weakness and headaches.  Hematological: Negative for adenopathy. Does not bruise/bleed easily.  Psychiatric/Behavioral: Negative for confusion and sleep disturbance. The patient is not nervous/anxious.     PE; Blood pressure 166/102, pulse 70, temperature 98.5 F (36.9 C), temperature source Oral, height 5' 4.25" (1.632 m), weight 149 lb 1.9 oz (67.64 kg), last menstrual period 12/09/2015, SpO2 99 %.  Exam chaperoned by CMA today. Gen: Alert, well appearing.  Patient is oriented to person, place, time, and situation. AFFECT: pleasant, lucid thought and speech. ENT: Ears: EACs clear, normal epithelium.  TMs with good light reflex and landmarks bilaterally.  Eyes: no injection, icteris, swelling, or exudate.  EOMI, PERRLA. Nose: no drainage or turbinate edema/swelling.  No injection or focal lesion.  Mouth: lips without lesion/swelling.  Oral mucosa pink and moist.  Dentition intact and without obvious caries or gingival swelling.  Oropharynx without erythema, exudate, or swelling.  Neck: supple/nontender.  No LAD, mass, or TM.  Carotid pulses 2+ bilaterally, without bruits. CV: RRR, no m/r/g.   LUNGS: CTA bilat, nonlabored resps, good aeration in all lung fields. ABD: soft, NT, ND, BS normal.  No hepatospenomegaly or mass.  No bruits. EXT: no clubbing, cyanosis, or edema.  Musculoskeletal: no joint swelling, erythema, warmth, or tenderness.  ROM of all joints intact. Skin - no sores or suspicious lesions or rashes or color changes  Pertinent labs:  Lab Results  Component Value Date   TSH 1.56  09/11/2015   Lab Results  Component Value Date   WBC 8.5 09/10/2015   HGB 13.1 09/10/2015   HCT 40.1 09/10/2015   MCV 99.5 09/10/2015   PLT 263 09/10/2015   Lab Results  Component Value Date   CREATININE 0.59 09/11/2015   BUN 6 09/11/2015   NA 136 09/11/2015   K 3.8 09/11/2015   CL 100 09/11/2015   CO2 25 09/11/2015   Lab Results  Component Value Date   ALT 31 09/11/2015   AST 32 09/11/2015   ALKPHOS 59 09/11/2015   BILITOT 0.5 09/11/2015   Lab Results  Component Value Date   CHOL  183 10/05/2014   Lab Results  Component Value Date   HDL 51.50 10/05/2014   Lab Results  Component Value Date   LDLCALC 121* 10/05/2014   Lab Results  Component Value Date   TRIG 53.0 10/05/2014   Lab Results  Component Value Date   CHOLHDL 4 10/05/2014   ASSESSMENT AND PLAN:   1) Uncontrolled HTN: complicated by stress and alcohol abuse, +/- white coat component. Increase amlodipine to  qd and continue all other bp meds at current doses. Strongly encouraged pt to check bp herself at home to give me more data to go on. Check  lytes/cr today.  2) Insomnia: i would rather her take a small dose of trazodone hs that drink excessive alcohol to get to sleep.  Trazodone , 1-2 tabs qhs prn rx'd today.  3) Health maintenance exam: Reviewed age and gender appropriate health maintenance issues (prudent diet, regular exercise, health risks of tobacco and excessive alcohol, use of seatbelts, fire alarms in home, use of sunscreen).  Also reviewed age and gender appropriate health screening as well as vaccine recommendations. No vaccines due today. She has GYN visit set for May this year per her report today. Check fasting HP labs today. Encouraged cutting back on alcohol and smoking once again today.  An After Visit Summary was printed and given to the patient.  FOLLOW UP:  Return for 3 weeks f/u HTN/insomnia.  Signed:  Santiago Bumpers, MD           01/01/2016

## 2016-01-02 ENCOUNTER — Encounter: Payer: Self-pay | Admitting: Family Medicine

## 2016-01-02 ENCOUNTER — Other Ambulatory Visit: Payer: Self-pay | Admitting: *Deleted

## 2016-01-02 DIAGNOSIS — R7401 Elevation of levels of liver transaminase levels: Secondary | ICD-10-CM | POA: Insufficient documentation

## 2016-01-02 DIAGNOSIS — R740 Nonspecific elevation of levels of transaminase and lactic acid dehydrogenase [LDH]: Principal | ICD-10-CM

## 2016-01-22 ENCOUNTER — Encounter: Payer: Self-pay | Admitting: Family Medicine

## 2016-01-22 ENCOUNTER — Ambulatory Visit (INDEPENDENT_AMBULATORY_CARE_PROVIDER_SITE_OTHER): Payer: BLUE CROSS/BLUE SHIELD | Admitting: Family Medicine

## 2016-01-22 VITALS — BP 132/100 | HR 84 | Temp 98.5°F | Resp 16 | Ht 64.25 in | Wt 148.5 lb

## 2016-01-22 DIAGNOSIS — F411 Generalized anxiety disorder: Secondary | ICD-10-CM

## 2016-01-22 DIAGNOSIS — I10 Essential (primary) hypertension: Secondary | ICD-10-CM

## 2016-01-22 DIAGNOSIS — G4700 Insomnia, unspecified: Secondary | ICD-10-CM | POA: Diagnosis not present

## 2016-01-22 MED ORDER — CLONAZEPAM 1 MG PO TABS: 1.0000 mg | ORAL_TABLET | Freq: Two times a day (BID) | ORAL | Status: DC | PRN

## 2016-01-22 NOTE — Progress Notes (Signed)
Pre visit review using our clinic review tool, if applicable. No additional management support is needed unless otherwise documented below in the visit note. 

## 2016-01-22 NOTE — Progress Notes (Signed)
OFFICE VISIT  01/22/2016   CC:  Chief Complaint  Patient presents with  . Follow-up    HTN and Insomnia. Pt is not fasting.     HPI:    Patient is a 40 y.o. Caucasian female who presents for 3 wk f/u HTN and insomnia. Feels anxious today. Has some home bp measurements: systolics now down into 130s, diastolics down into 90s. She is becoming more comfortable with checking her bp at home, not quite as anxious.  Took 1/2 traz on 3 occasions and this caused some excessive morning drowsiness but did help with sleep. She admits she had alcohol in her system and this likely contributed.  We discussed once again the need for her to cut back on alcohol and eventually quit.  She agrees, once again she sites extra family stress surrounding the health of her daughter with NF-1.  She says she is taking 1 and 1/2 of the 0.5mg  clonazepam to be able to get some therapeutic effect for her anxiety.  This is a med that was originally rx'd by Dr. Alwyn RenHopper when he was seeing her--per the pt report today.  Past Medical History  Diagnosis Date  . Hypertension   . Anxiety   . Tobacco dependence   . Elevated transaminase level     Past Surgical History  Procedure Laterality Date  . Wisdom tooth extraction    . Cervical cone biopsy      for HPV  . Cataract extraction w/ intraocular lens  implant, bilateral  2008 and 2010    Outpatient Prescriptions Prior to Visit  Medication Sig Dispense Refill  . amLODipine (NORVASC) 10 MG tablet Take 1 tablet (10 mg total) by mouth daily. 30 tablet 1  . aspirin 81 MG tablet Take 81 mg by mouth daily.    . carvedilol (COREG) 25 MG tablet Take 1 tablet (25 mg total) by mouth 2 (two) times daily with a meal. 60 tablet 6  . hydrochlorothiazide (HYDRODIURIL) 25 MG tablet Take 1 tablet (25 mg total) by mouth daily. 90 tablet 1  . mometasone (ELOCON) 0.1 % ointment Apply bid 45 g 0  . norethindrone (CAMILA) 0.35 MG tablet Take 1 tablet by mouth daily.    . sertraline  (ZOLOFT) 50 MG tablet Take 1 tablet (50 mg total) by mouth daily. 90 tablet 1  . traZODone (DESYREL) 50 MG tablet 1-2 tabs po qhs prn insomnia 60 tablet 1  . clonazePAM (KLONOPIN) 0.5 MG tablet Take 1 tablet (0.5 mg total) by mouth 2 (two) times daily as needed. 30 tablet 5   No facility-administered medications prior to visit.    Allergies  Allergen Reactions  . Lisinopril     Angioedema  . Sulfa Antibiotics      ? reaction    ROS As per HPI  PE: Blood pressure 132/100, pulse 84, temperature 98.5 F (36.9 C), temperature source Oral, resp. rate 16, height 5' 4.25" (1.632 m), weight 148 lb 8 oz (67.359 kg), SpO2 100 %. Repeat bp 132/100 Gen: Alert, well appearing.  Patient is oriented to person, place, time, and situation. CV: RRR, no m/r/g.   LUNGS: CTA bilat, nonlabored resps, good aeration in all lung fields. EXT: no clubbing, cyanosis, or edema.   LABS:  Lab Results  Component Value Date   TSH 1.10 01/01/2016   Lab Results  Component Value Date   WBC 6.2 01/01/2016   HGB 14.8 01/01/2016   HCT 43.7 01/01/2016   MCV 97.4 01/01/2016   PLT  189.0 01/01/2016   Lab Results  Component Value Date   CREATININE 0.50 01/01/2016   BUN 8 01/01/2016   NA 137 01/01/2016   K 4.1 01/01/2016   CL 102 01/01/2016   CO2 29 01/01/2016   Lab Results  Component Value Date   ALT 42* 01/01/2016   AST 40* 01/01/2016   ALKPHOS 53 01/01/2016   BILITOT 0.5 01/01/2016   Lab Results  Component Value Date   CHOL 147 01/01/2016   Lab Results  Component Value Date   HDL 49.20 01/01/2016   Lab Results  Component Value Date   LDLCALC 87 01/01/2016   Lab Results  Component Value Date   TRIG 50.0 01/01/2016   Lab Results  Component Value Date   CHOLHDL 3 01/01/2016    IMPRESSION AND PLAN:  1) HTN: control improving.  Will leave her on current regimen and have her continue to home monitor 3 x/week. Encouraged further cut back on alcohol to help get bp better  controlled.  2) Insomnia: improved but pt needs to avoid using trazodone (or any of her sedatives/neuro meds) with alcohol.  3) GAD: this will be a long process of encouraging her to NOT rely on her alcohol and to encourage/trust safe use of neuropsychiatric meds.  Will go ahead and increase her clonazepam to  strength, may take 1 bid prn, #60, RF x 5.  An After Visit Summary was printed and given to the patient.  FOLLOW UP: Return in about 4 months (around 05/23/2016) for f/u HTN.  Signed:  Santiago Bumpers, MD           01/22/2016

## 2016-03-01 ENCOUNTER — Telehealth: Payer: Self-pay | Admitting: Family Medicine

## 2016-03-01 NOTE — Telephone Encounter (Signed)
Per our records pt should still have one refill on her hctz at [facility]. Advised pt to call pharmacy for refill.

## 2016-03-01 NOTE — Telephone Encounter (Signed)
Patient is going out of town today. She just realized she does not have a birth control to take tomorrow. She sees Dr. Levada SchillingGrewald. Patient didn't realize the OB office closes at 1PM. Patient is asking if our office can send in birth control refill. Patient also mentioned she will run out of hydrochlozide Monday.

## 2016-03-01 NOTE — Telephone Encounter (Signed)
Patient called back she was able to get birth control Rx from the on call OBGYN. She only needs the bp med from our office.

## 2016-03-13 DIAGNOSIS — Z1212 Encounter for screening for malignant neoplasm of rectum: Secondary | ICD-10-CM | POA: Diagnosis not present

## 2016-03-13 DIAGNOSIS — Z6824 Body mass index (BMI) 24.0-24.9, adult: Secondary | ICD-10-CM | POA: Diagnosis not present

## 2016-03-13 DIAGNOSIS — Z1231 Encounter for screening mammogram for malignant neoplasm of breast: Secondary | ICD-10-CM | POA: Diagnosis not present

## 2016-03-13 DIAGNOSIS — Z01419 Encounter for gynecological examination (general) (routine) without abnormal findings: Secondary | ICD-10-CM | POA: Diagnosis not present

## 2016-04-07 ENCOUNTER — Other Ambulatory Visit: Payer: Self-pay | Admitting: Internal Medicine

## 2016-04-08 ENCOUNTER — Other Ambulatory Visit: Payer: Self-pay | Admitting: *Deleted

## 2016-04-08 MED ORDER — AMLODIPINE BESYLATE 10 MG PO TABS
10.0000 mg | ORAL_TABLET | Freq: Every day | ORAL | Status: DC
Start: 2016-04-08 — End: 2017-04-07

## 2016-04-08 MED ORDER — SERTRALINE HCL 50 MG PO TABS: 50.0000 mg | ORAL_TABLET | Freq: Every day | ORAL | Status: DC

## 2016-04-08 NOTE — Telephone Encounter (Signed)
RF request for amlodipine LOV: 01/22/16 Next ov: 05/14/16 Last written: 01/01/16 #30 w/ 1RF

## 2016-04-08 NOTE — Telephone Encounter (Signed)
RF request for sertraline LOV: 01/22/16 Next ov: 05/14/16 Last written: 09/11/15 #90 w/ 1RF

## 2016-05-14 ENCOUNTER — Encounter: Payer: Self-pay | Admitting: Family Medicine

## 2016-05-14 ENCOUNTER — Ambulatory Visit (INDEPENDENT_AMBULATORY_CARE_PROVIDER_SITE_OTHER): Payer: BLUE CROSS/BLUE SHIELD | Admitting: Family Medicine

## 2016-05-14 VITALS — BP 127/93 | HR 87 | Temp 98.8°F | Resp 16 | Ht 64.25 in | Wt 139.0 lb

## 2016-05-14 DIAGNOSIS — I10 Essential (primary) hypertension: Secondary | ICD-10-CM | POA: Diagnosis not present

## 2016-05-14 DIAGNOSIS — F1010 Alcohol abuse, uncomplicated: Secondary | ICD-10-CM | POA: Diagnosis not present

## 2016-05-14 DIAGNOSIS — R740 Nonspecific elevation of levels of transaminase and lactic acid dehydrogenase [LDH]: Secondary | ICD-10-CM | POA: Diagnosis not present

## 2016-05-14 DIAGNOSIS — F411 Generalized anxiety disorder: Secondary | ICD-10-CM | POA: Diagnosis not present

## 2016-05-14 DIAGNOSIS — R7401 Elevation of levels of liver transaminase levels: Secondary | ICD-10-CM

## 2016-05-14 LAB — COMPREHENSIVE METABOLIC PANEL
ALBUMIN: 4.6 g/dL (ref 3.5–5.2)
ALK PHOS: 61 U/L (ref 39–117)
ALT: 38 U/L — AB (ref 0–35)
AST: 40 U/L — AB (ref 0–37)
BILIRUBIN TOTAL: 0.5 mg/dL (ref 0.2–1.2)
BUN: 8 mg/dL (ref 6–23)
CALCIUM: 9.8 mg/dL (ref 8.4–10.5)
CO2: 28 mEq/L (ref 19–32)
CREATININE: 0.58 mg/dL (ref 0.40–1.20)
Chloride: 101 mEq/L (ref 96–112)
GFR: 122.26 mL/min (ref >60.00)
Glucose, Bld: 93 mg/dL (ref 70–99)
Potassium: 3.9 mEq/L (ref 3.5–5.1)
Sodium: 138 mEq/L (ref 135–145)
Total Protein: 7.7 g/dL (ref 6.0–8.3)

## 2016-05-14 MED ORDER — CLONAZEPAM 1 MG PO TABS: 1.0000 mg | ORAL_TABLET | Freq: Two times a day (BID) | ORAL | 3 refills | Status: DC | PRN

## 2016-05-14 NOTE — Progress Notes (Signed)
OFFICE VISIT  05/14/2016   CC:  Chief Complaint  Patient presents with  . Follow-up    HTN, pt is not fasting.     HPI:    Patient is a 40 y.o. Caucasian female who presents for 4 mo f/u HTN and GAD. Last visit I encouraged her to continue to try to cut back on her alcohol intake in order to help bp control. I also increased her clonaz to  bid.  She has cut back some on alc but still smoking too much. BP monitoring at home: avg syst 130s, avg diast 90s.  She is exercising regularly. She seems to have benefited from the increase in clonaz to 1 mg bid.  For now, she is in favor of no change in her sertraline dosing.  ROS: no HAs, no dizziness, no CP, no SOB, no abd pain, no n/v  Past Medical History:  Diagnosis Date  . Anxiety   . Elevated transaminase level    likely secondary to alcohol  . Hypertension   . Tobacco dependence     Past Surgical History:  Procedure Laterality Date  . CATARACT EXTRACTION W/ INTRAOCULAR LENS  IMPLANT, BILATERAL  2008 and 2010  . CERVICAL CONE BIOPSY     for HPV  . WISDOM TOOTH EXTRACTION      Outpatient Medications Prior to Visit  Medication Sig Dispense Refill  . amLODipine (NORVASC) 10 MG tablet Take 1 tablet (10 mg total) by mouth daily. 30 tablet 11  . aspirin 81 MG tablet Take 81 mg by mouth daily.    . carvedilol (COREG) 25 MG tablet Take 1 tablet (25 mg total) by mouth 2 (two) times daily with a meal. 60 tablet 6  . hydrochlorothiazide (HYDRODIURIL) 25 MG tablet Take 1 tablet (25 mg total) by mouth daily. 90 tablet 1  . mometasone (ELOCON) 0.1 % ointment Apply bid 45 g 0  . norethindrone (CAMILA) 0.35 MG tablet Take 1 tablet by mouth daily.    . sertraline (ZOLOFT) 50 MG tablet Take 1 tablet (50 mg total) by mouth daily. 90 tablet 3  . traZODone (DESYREL) 50 MG tablet 1-2 tabs po qhs prn insomnia 60 tablet 1  . clonazePAM (KLONOPIN) 1 MG tablet Take 1 tablet (1 mg total) by mouth 2 (two) times daily as needed for anxiety. 60  tablet 3   No facility-administered medications prior to visit.     Allergies  Allergen Reactions  . Lisinopril     Angioedema  . Sulfa Antibiotics      ? reaction    ROS As per HPI  PE: Blood pressure (!) 127/93, pulse 87, temperature 98.8 F (37.1 C), temperature source Oral, resp. rate 16, height 5' 4.25" (1.632 m), weight 139 lb (63 kg), last menstrual period 05/07/2016, SpO2 99 %. Gen: Alert, well appearing.  Patient is oriented to person, place, time, and situation. CV: RRR, no m/r/g.   LUNGS: CTA bilat, nonlabored resps, good aeration in all lung fields. EXT: no clubbing, cyanosis, or edema.    LABS:  Lab Results  Component Value Date   TSH 1.10 01/01/2016   Lab Results  Component Value Date   WBC 6.2 01/01/2016   HGB 14.8 01/01/2016   HCT 43.7 01/01/2016   MCV 97.4 01/01/2016   PLT 189.0 01/01/2016   Lab Results  Component Value Date   CREATININE 0.50 01/01/2016   BUN 8 01/01/2016   NA 137 01/01/2016   K 4.1 01/01/2016   CL 102 01/01/2016  CO2 29 01/01/2016   Lab Results  Component Value Date   ALT 42 (H) 01/01/2016   AST 40 (H) 01/01/2016   ALKPHOS 53 01/01/2016   BILITOT 0.5 01/01/2016   Lab Results  Component Value Date   CHOL 147 01/01/2016   Lab Results  Component Value Date   HDL 49.20 01/01/2016   Lab Results  Component Value Date   LDLCALC 87 01/01/2016   Lab Results  Component Value Date   TRIG 50.0 01/01/2016   Lab Results  Component Value Date   CHOLHDL 3 01/01/2016    IMPRESSION AND PLAN:  1) HTN: control near normal, just some diastolic elevation into 90s consistently. We discussed option of adding another med (ARB) or continuing current regimen and continuing to try to cut back on drinking alcohol and smoking.  She chose the latter option. Recheck lytes/cr today.  2) Elevated LFTs: secondary to ETOH abuse. Recheck hepatic panel today.  3) Alcohol abuse: cutting back but still suffering a lot from anxiety so she  turns to this. Encouraged ongoing efforts to cut back/quit.  4) GAD: doing fairly well considering her circumstances (daughter with NF-1). She did not want to change her med regimen for this at this time and I'm in agreement with this, although I think she may benefit from therapeutic doses of SSRI at some time.   Refilled clonaz today.   An After Visit Summary was printed and given to the patient.  FOLLOW UP: Return in about 4 months (around 09/13/2016) for routine chronic illness f/u.  Signed:  Santiago Bumpers, MD           05/14/2016

## 2016-05-14 NOTE — Progress Notes (Signed)
Pre visit review using our clinic review tool, if applicable. No additional management support is needed unless otherwise documented below in the visit note. 

## 2016-06-05 ENCOUNTER — Ambulatory Visit (INDEPENDENT_AMBULATORY_CARE_PROVIDER_SITE_OTHER): Payer: BLUE CROSS/BLUE SHIELD | Admitting: Family Medicine

## 2016-06-05 ENCOUNTER — Encounter: Payer: Self-pay | Admitting: Family Medicine

## 2016-06-05 VITALS — BP 122/87 | HR 93 | Temp 99.0°F | Resp 16 | Ht 64.25 in | Wt 139.8 lb

## 2016-06-05 DIAGNOSIS — K5732 Diverticulitis of large intestine without perforation or abscess without bleeding: Secondary | ICD-10-CM | POA: Diagnosis not present

## 2016-06-05 DIAGNOSIS — R1030 Lower abdominal pain, unspecified: Secondary | ICD-10-CM

## 2016-06-05 LAB — COMPREHENSIVE METABOLIC PANEL WITH GFR
ALT: 48 U/L — ABNORMAL HIGH (ref 0–35)
AST: 53 U/L — ABNORMAL HIGH (ref 0–37)
Albumin: 4.4 g/dL (ref 3.5–5.2)
Alkaline Phosphatase: 51 U/L (ref 39–117)
BUN: 10 mg/dL (ref 6–23)
CO2: 31 meq/L (ref 19–32)
Calcium: 9.2 mg/dL (ref 8.4–10.5)
Chloride: 102 meq/L (ref 96–112)
Creatinine, Ser: 0.63 mg/dL (ref 0.40–1.20)
GFR: 111.1 mL/min
Glucose, Bld: 124 mg/dL — ABNORMAL HIGH (ref 70–99)
Potassium: 4.5 meq/L (ref 3.5–5.1)
Sodium: 137 meq/L (ref 135–145)
Total Bilirubin: 0.5 mg/dL (ref 0.2–1.2)
Total Protein: 7.1 g/dL (ref 6.0–8.3)

## 2016-06-05 LAB — CBC WITH DIFFERENTIAL/PLATELET
Basophils Absolute: 0 K/uL (ref 0.0–0.1)
Basophils Relative: 0.2 % (ref 0.0–3.0)
Eosinophils Absolute: 0.2 K/uL (ref 0.0–0.7)
Eosinophils Relative: 2.2 % (ref 0.0–5.0)
HCT: 40.8 % (ref 36.0–46.0)
Hemoglobin: 13.9 g/dL (ref 12.0–15.0)
Lymphocytes Relative: 17.5 % (ref 12.0–46.0)
Lymphs Abs: 1.6 K/uL (ref 0.7–4.0)
MCHC: 34 g/dL (ref 30.0–36.0)
MCV: 97.8 fl (ref 78.0–100.0)
Monocytes Absolute: 0.6 K/uL (ref 0.1–1.0)
Monocytes Relative: 6.5 % (ref 3.0–12.0)
Neutro Abs: 6.5 K/uL (ref 1.4–7.7)
Neutrophils Relative %: 73.6 % (ref 43.0–77.0)
Platelets: 248 K/uL (ref 150.0–400.0)
RBC: 4.18 Mil/uL (ref 3.87–5.11)
RDW: 13.3 % (ref 11.5–15.5)
WBC: 8.9 K/uL (ref 4.0–10.5)

## 2016-06-05 LAB — POC URINALSYSI DIPSTICK (AUTOMATED)
Bilirubin, UA: NEGATIVE
Blood, UA: NEGATIVE
Glucose, UA: NEGATIVE
Ketones, UA: NEGATIVE
Leukocytes, UA: NEGATIVE
Nitrite, UA: NEGATIVE
Protein, UA: NEGATIVE
Spec Grav, UA: 1.02
Urobilinogen, UA: 4
pH, UA: 6.5

## 2016-06-05 LAB — POCT URINE PREGNANCY: PREG TEST UR: NEGATIVE

## 2016-06-05 MED ORDER — METRONIDAZOLE 500 MG PO TABS: 500.0000 mg | ORAL_TABLET | Freq: Three times a day (TID) | ORAL | 0 refills | Status: AC

## 2016-06-05 MED ORDER — CIPROFLOXACIN HCL 500 MG PO TABS: 500.0000 mg | ORAL_TABLET | Freq: Two times a day (BID) | ORAL | 0 refills | Status: AC

## 2016-06-05 NOTE — Progress Notes (Signed)
Pre visit review using our clinic review tool, if applicable. No additional management support is needed unless otherwise documented below in the visit note. 

## 2016-06-05 NOTE — Progress Notes (Signed)
OFFICE VISIT  06/05/2016   CC:  Chief Complaint  Patient presents with  . Abdominal Pain    lower to middle abdomin, dull, sharpe ache x 5 days  . Eye Problem    right lower lid   HPI:    Patient is a 40 y.o. Caucasian female who presents for suprapubic pressure and achiness, onset 5-6 days ago. Movement made it worse.  This morning the symptoms were gone until she started running around a lot to get ready and she felt some jarring discomfort intermittently.  If she sits still now, she feels no pressure or pain. No urinary urgency or frequency, no dysuria.  No radiation of the pain.  No n/v/d or constipation. No fever.  Appetite was normal--has been mildly depressed appetite "quite a while now".   No coincidental change in any med or diet.  Alcohol intake, if anything, was improved but she did drink more AFTER the onset of the pain while on vacation at the beach.    Past Medical History:  Diagnosis Date  . Anxiety   . Elevated transaminase level    likely secondary to alcohol  . Hypertension   . Tobacco dependence     Past Surgical History:  Procedure Laterality Date  . CATARACT EXTRACTION W/ INTRAOCULAR LENS  IMPLANT, BILATERAL  2008 and 2010  . CERVICAL CONE BIOPSY     for HPV  . WISDOM TOOTH EXTRACTION      Outpatient Medications Prior to Visit  Medication Sig Dispense Refill  . amLODipine (NORVASC) 10 MG tablet Take 1 tablet (10 mg total) by mouth daily. 30 tablet 11  . aspirin 81 MG tablet Take 81 mg by mouth daily.    . carvedilol (COREG) 25 MG tablet Take 1 tablet (25 mg total) by mouth 2 (two) times daily with a meal. 60 tablet 6  . clonazePAM (KLONOPIN) 1 MG tablet Take 1 tablet (1 mg total) by mouth 2 (two) times daily as needed for anxiety. 60 tablet 3  . hydrochlorothiazide (HYDRODIURIL) 25 MG tablet Take 1 tablet (25 mg total) by mouth daily. 90 tablet 1  . mometasone (ELOCON) 0.1 % ointment Apply bid 45 g 0  . norethindrone (CAMILA) 0.35 MG tablet Take 1  tablet by mouth daily.    . sertraline (ZOLOFT) 50 MG tablet Take 1 tablet (50 mg total) by mouth daily. 90 tablet 3  . traZODone (DESYREL) 50 MG tablet 1-2 tabs po qhs prn insomnia 60 tablet 1   No facility-administered medications prior to visit.     Allergies  Allergen Reactions  . Lisinopril     Angioedema  . Sulfa Antibiotics      ? reaction    ROS As per HPI  PE: Blood pressure 122/87, pulse 93, temperature 99 F (37.2 C), temperature source Oral, resp. rate 16, height 5' 4.25" (1.632 m), weight 139 lb 12 oz (63.4 kg), last menstrual period 06/05/2016, SpO2 99 %. Gen: Alert, well appearing.  Patient is oriented to person, place, time, and situation. ZOX:WRUEENT:Eyes: no injection, icteris, swelling, or exudate.  EOMI, PERRLA. Mouth: lips without lesion/swelling.  Oral mucosa pink and moist. Oropharynx without erythema, exudate, or swelling.  Neck - No masses or thyromegaly or limitation in range of motion CV: RRR, no m/r/g.   LUNGS: CTA bilat, nonlabored resps, good aeration in all lung fields. ABD: soft, mild TTP in umbilical and suprapubic regions, ND, BS normal.  No hepatospenomegaly or mass.  No bruits. EXT: no clubbing, cyanosis, or  edema.   LABS:  CC UA today normal UPT today: NEG  IMPRESSION AND PLAN:  1) Lower abd/suprapubic pain: question of atypical presentation of diverticulitis. No sign of UTI, although I'll send urine clx for completeness. Will check CBC w/diff and CMET. Start cipro 500 mg bid and flagyl 500 mg tid, both x 14d. If worsening in next few days OR if not improving by early next week then will pursue imaging with CT abd/pelv.  An After Visit Summary was printed and given to the patient.  FOLLOW UP: Return if symptoms worsen or fail to improve.  Signed:  Santiago BumpersPhil Dennette Faulconer, MD           06/05/2016

## 2016-06-06 LAB — URINE CULTURE: ORGANISM ID, BACTERIA: NO GROWTH

## 2016-07-22 ENCOUNTER — Other Ambulatory Visit: Payer: Self-pay

## 2016-07-22 MED ORDER — CARVEDILOL 25 MG PO TABS: 25.0000 mg | ORAL_TABLET | Freq: Two times a day (BID) | ORAL | 6 refills | Status: DC

## 2016-07-28 DIAGNOSIS — Z23 Encounter for immunization: Secondary | ICD-10-CM | POA: Diagnosis not present

## 2016-09-09 ENCOUNTER — Other Ambulatory Visit: Payer: Self-pay | Admitting: *Deleted

## 2016-09-09 MED ORDER — HYDROCHLOROTHIAZIDE 25 MG PO TABS: 25.0000 mg | ORAL_TABLET | Freq: Every day | ORAL | 3 refills | Status: DC

## 2016-09-09 NOTE — Telephone Encounter (Signed)
RF request for hctz LOV: 05/14/16 Next ov: 09/13/16 Last written: 12/22/15 390 w/ 1RF

## 2016-09-13 ENCOUNTER — Ambulatory Visit (INDEPENDENT_AMBULATORY_CARE_PROVIDER_SITE_OTHER): Payer: BLUE CROSS/BLUE SHIELD | Admitting: Family Medicine

## 2016-09-13 ENCOUNTER — Encounter: Payer: Self-pay | Admitting: Family Medicine

## 2016-09-13 VITALS — BP 139/91 | HR 85 | Temp 98.9°F | Resp 16 | Ht 64.25 in | Wt 137.8 lb

## 2016-09-13 DIAGNOSIS — F418 Other specified anxiety disorders: Secondary | ICD-10-CM | POA: Diagnosis not present

## 2016-09-13 DIAGNOSIS — F1010 Alcohol abuse, uncomplicated: Secondary | ICD-10-CM

## 2016-09-13 DIAGNOSIS — I10 Essential (primary) hypertension: Secondary | ICD-10-CM | POA: Diagnosis not present

## 2016-09-13 DIAGNOSIS — R740 Nonspecific elevation of levels of transaminase and lactic acid dehydrogenase [LDH]: Secondary | ICD-10-CM | POA: Diagnosis not present

## 2016-09-13 DIAGNOSIS — F419 Anxiety disorder, unspecified: Secondary | ICD-10-CM

## 2016-09-13 DIAGNOSIS — F329 Major depressive disorder, single episode, unspecified: Secondary | ICD-10-CM

## 2016-09-13 DIAGNOSIS — R7401 Elevation of levels of liver transaminase levels: Secondary | ICD-10-CM

## 2016-09-13 DIAGNOSIS — F32A Depression, unspecified: Secondary | ICD-10-CM

## 2016-09-13 LAB — COMPREHENSIVE METABOLIC PANEL
ALT: 15 U/L (ref 0–35)
AST: 20 U/L (ref 0–37)
Albumin: 4.6 g/dL (ref 3.5–5.2)
Alkaline Phosphatase: 55 U/L (ref 39–117)
BUN: 12 mg/dL (ref 6–23)
CHLORIDE: 100 meq/L (ref 96–112)
CO2: 30 meq/L (ref 19–32)
CREATININE: 0.66 mg/dL (ref 0.40–1.20)
Calcium: 9.6 mg/dL (ref 8.4–10.5)
GFR: 105.15 mL/min (ref >60.00)
GLUCOSE: 90 mg/dL (ref 70–99)
POTASSIUM: 4.5 meq/L (ref 3.5–5.1)
SODIUM: 138 meq/L (ref 135–145)
Total Bilirubin: 0.6 mg/dL (ref 0.2–1.2)
Total Protein: 7.4 g/dL (ref 6.0–8.3)

## 2016-09-13 MED ORDER — IRBESARTAN 75 MG PO TABS: 75.0000 mg | ORAL_TABLET | Freq: Every day | ORAL | 2 refills | Status: DC

## 2016-09-13 NOTE — Progress Notes (Signed)
OFFICE VISIT  09/13/2016   CC:  Chief Complaint  Patient presents with  . Follow-up    Pt is not fasting.    HPI:    Patient is a 40 y.o. Caucasian female who presents for 4 mo f/u HTN, GAD, history of alcohol abuse, hx of mildly elevated transaminases.    Home bp monitoring: syst 130s, diast 90s.   Alcohol intake: 1 and 1/2 bottles of wine per night.   Exercise: walks fast on treadmill 45 min per day, 4-5 days a week.  Starts crying when we start discussing her anxiety.  Describes lots of fears about getting herself out of the house, going around lots of people.  Things got worse when her daughter was going through chemo.  Daughter got her port out recently, though.   Pt feels depressed a fair amount of time as well.  No SI or HI.  Takes clonazepam qd to bid and finds this helpful for her anxiety.  ROS: no HA, no CP, no palpitations, no SOB, no LE swelling.  No abd pain.    Past Medical History:  Diagnosis Date  . Anxiety   . Elevated transaminase level    likely secondary to alcohol  . Hypertension   . Tobacco dependence     Past Surgical History:  Procedure Laterality Date  . CATARACT EXTRACTION W/ INTRAOCULAR LENS  IMPLANT, BILATERAL  2008 and 2010  . CERVICAL CONE BIOPSY     for HPV  . WISDOM TOOTH EXTRACTION      Outpatient Medications Prior to Visit  Medication Sig Dispense Refill  . amLODipine (NORVASC) 10 MG tablet Take 1 tablet (10 mg total) by mouth daily. 30 tablet 11  . aspirin 81 MG tablet Take 81 mg by mouth daily.    . carvedilol (COREG) 25 MG tablet Take 1 tablet (25 mg total) by mouth 2 (two) times daily with a meal. 60 tablet 6  . clonazePAM (KLONOPIN) 1 MG tablet Take 1 tablet (1 mg total) by mouth 2 (two) times daily as needed for anxiety. 60 tablet 3  . hydrochlorothiazide (HYDRODIURIL) 25 MG tablet Take 1 tablet (25 mg total) by mouth daily. 90 tablet 3  . mometasone (ELOCON) 0.1 % ointment Apply bid 45 g 0  . norethindrone (CAMILA) 0.35 MG  tablet Take 1 tablet by mouth daily.    . sertraline (ZOLOFT) 50 MG tablet Take 1 tablet (50 mg total) by mouth daily. 90 tablet 3  . traZODone (DESYREL) 50 MG tablet 1-2 tabs po qhs prn insomnia (Patient not taking: Reported on 09/13/2016) 60 tablet 1   No facility-administered medications prior to visit.     Allergies  Allergen Reactions  . Lisinopril     Angioedema  . Sulfa Antibiotics      ? reaction    ROS As per HPI  PE: Blood pressure (!) 139/91, pulse 85, temperature 98.9 F (37.2 C), temperature source Oral, resp. rate 16, height 5' 4.25" (1.632 m), weight 137 lb 12 oz (62.5 kg), last menstrual period 08/14/2016, SpO2 98 %. Gen: Alert, well appearing.  Patient is oriented to person, place, time, and situation. AFFECT: pleasant, lucid thought and speech.  She did start crying when discussing her anxiety and depression. No further exam today.  LABS:  Lab Results  Component Value Date   TSH 1.10 01/01/2016   Lab Results  Component Value Date   WBC 8.9 06/05/2016   HGB 13.9 06/05/2016   HCT 40.8 06/05/2016   MCV  97.8 06/05/2016   PLT 248.0 06/05/2016   Lab Results  Component Value Date   CREATININE 0.63 06/05/2016   BUN 10 06/05/2016   NA 137 06/05/2016   K 4.5 06/05/2016   CL 102 06/05/2016   CO2 31 06/05/2016   Lab Results  Component Value Date   ALT 48 (H) 06/05/2016   AST 53 (H) 06/05/2016   ALKPHOS 51 06/05/2016   BILITOT 0.5 06/05/2016   Lab Results  Component Value Date   CHOL 147 01/01/2016   Lab Results  Component Value Date   HDL 49.20 01/01/2016   Lab Results  Component Value Date   LDLCALC 87 01/01/2016   Lab Results  Component Value Date   TRIG 50.0 01/01/2016   Lab Results  Component Value Date   CHOLHDL 3 01/01/2016   Lab Results  Component Value Date   HGBA1C 5.4 10/10/2014    IMPRESSION AND PLAN:  1) HTN: not ideal control. Continue current meds and add irbesartan 75mg  qd. Check lytes/cr today.  2) Anxiety and  depression: situational to some degree (her daughter's illness). Increase sertraline to 100 mg qd (she'll double up on her 50mg  tabs until running out of these, then I'll send in 100 mg tabs for 90 day supply.  3) Alcohol abuse: she is not ready to try quitting.  She will continue to try to slowly cut back on alcohol intake.  4) Hx of elevated AST/ALT: suspected due to #3 above.  Recheck hepatic panel today and will also screen Hep B and Hep C.  An After Visit Summary was printed and given to the patient.  FOLLOW UP: Return in about 4 months (around 01/12/2017) for annual CPE (fasting).  Signed:  Santiago BumpersPhil Correna Meacham, MD           09/13/2016

## 2016-09-13 NOTE — Progress Notes (Signed)
Pre visit review using our clinic review tool, if applicable. No additional management support is needed unless otherwise documented below in the visit note. 

## 2016-09-14 LAB — HEPATITIS B SURFACE ANTIGEN: Hepatitis B Surface Ag: NEGATIVE

## 2016-09-14 LAB — HEPATITIS C ANTIBODY: HCV Ab: NEGATIVE

## 2016-09-23 ENCOUNTER — Telehealth: Payer: Self-pay | Admitting: Family Medicine

## 2016-09-23 MED ORDER — SERTRALINE HCL 100 MG PO TABS: 100.0000 mg | ORAL_TABLET | Freq: Every day | ORAL | 1 refills | Status: DC

## 2016-09-23 NOTE — Telephone Encounter (Signed)
Noted  

## 2016-09-23 NOTE — Telephone Encounter (Signed)
FYI:  Pt called stating that she was out of sertraline due to increase in dose.  I read through Dr Samul DadaMcGowen's last OV note and it stated to send in 90 day supply of sertraline 100mg .   Pt is not due to come back into office for 4 months so I put one RF on medication.

## 2016-10-22 ENCOUNTER — Other Ambulatory Visit: Payer: Self-pay | Admitting: *Deleted

## 2016-10-22 MED ORDER — CLONAZEPAM 1 MG PO TABS: 1.0000 mg | ORAL_TABLET | Freq: Two times a day (BID) | ORAL | 3 refills | Status: DC | PRN

## 2016-10-22 NOTE — Telephone Encounter (Signed)
[facility]  RF request for clonazepam LOV: 09/13/16 Next ov: 01/13/17 Last written: 05/14/16 #60 w/ 3RF  Please advise. Thanks.

## 2016-10-23 NOTE — Telephone Encounter (Signed)
Rx faxed

## 2016-12-10 ENCOUNTER — Other Ambulatory Visit: Payer: Self-pay | Admitting: *Deleted

## 2016-12-10 MED ORDER — IRBESARTAN 75 MG PO TABS: 75.0000 mg | ORAL_TABLET | Freq: Every day | ORAL | 6 refills | Status: DC

## 2016-12-10 NOTE — Telephone Encounter (Signed)
[facility]  RF request for irbesartan LOV: 09/13/16 Next ov: 01/13/17 Last written: 09/13/16 #30 w/ 2RF

## 2016-12-13 ENCOUNTER — Telehealth: Payer: Self-pay | Admitting: Family Medicine

## 2016-12-13 MED ORDER — CIPROFLOXACIN HCL 500 MG PO TABS: 500.0000 mg | ORAL_TABLET | Freq: Two times a day (BID) | ORAL | 0 refills | Status: DC

## 2016-12-13 MED ORDER — METRONIDAZOLE 500 MG PO TABS: 500.0000 mg | ORAL_TABLET | Freq: Three times a day (TID) | ORAL | 0 refills | Status: DC

## 2016-12-13 NOTE — Telephone Encounter (Signed)
Patient thinks she has diverticulitis again. Can Dr.  Milinda CaveMcGowen prescribe the same medication he did in August for the same symptoms? Please call patient to advise today.   Patient has to take daughter to appointment and it is okay to leave a detailed message on mobile phone.

## 2016-12-13 NOTE — Telephone Encounter (Signed)
Pt advised and voiced understanding.  [facility]

## 2016-12-13 NOTE — Telephone Encounter (Signed)
Please advise. Thanks.  

## 2016-12-13 NOTE — Addendum Note (Signed)
Addended by: Smitty KnudsenSUTHERLAND, HEATHER K on: 12/13/2016 04:20 PM   Modules accepted: Orders

## 2016-12-13 NOTE — Telephone Encounter (Signed)
OK. Pls eRx cipro 500mg , 1 tab po bid x 14d, #28, no RF And Metronidazole 500 mg, 1 tab po tid x 14d, #42, no RF. Tell her that if her symptoms are not improving in 48-72h OR if they are getting worse despite antibiotics then she needs to seek medical care.-thx

## 2017-01-13 ENCOUNTER — Encounter: Payer: Self-pay | Admitting: Family Medicine

## 2017-01-13 ENCOUNTER — Ambulatory Visit (INDEPENDENT_AMBULATORY_CARE_PROVIDER_SITE_OTHER): Payer: BLUE CROSS/BLUE SHIELD | Admitting: Family Medicine

## 2017-01-13 VITALS — BP 134/90 | HR 81 | Temp 98.6°F | Resp 16 | Ht 65.0 in | Wt 140.5 lb

## 2017-01-13 DIAGNOSIS — Z0000 Encounter for general adult medical examination without abnormal findings: Secondary | ICD-10-CM

## 2017-01-13 LAB — CBC WITH DIFFERENTIAL/PLATELET
Basophils Absolute: 0 10*3/uL (ref 0.0–0.1)
Basophils Relative: 0.8 % (ref 0.0–3.0)
EOS PCT: 3.1 % (ref 0.0–5.0)
Eosinophils Absolute: 0.2 10*3/uL (ref 0.0–0.7)
HCT: 40 % (ref 36.0–46.0)
HEMOGLOBIN: 13.6 g/dL (ref 12.0–15.0)
Lymphocytes Relative: 33.1 % (ref 12.0–46.0)
Lymphs Abs: 1.8 10*3/uL (ref 0.7–4.0)
MCHC: 34 g/dL (ref 30.0–36.0)
MCV: 100 fl (ref 78.0–100.0)
MONO ABS: 0.5 10*3/uL (ref 0.1–1.0)
Monocytes Relative: 10.2 % (ref 3.0–12.0)
Neutro Abs: 2.8 10*3/uL (ref 1.4–7.7)
Neutrophils Relative %: 52.8 % (ref 43.0–77.0)
Platelets: 245 10*3/uL (ref 150.0–400.0)
RBC: 4.01 Mil/uL (ref 3.87–5.11)
RDW: 12.6 % (ref 11.5–15.5)
WBC: 5.3 10*3/uL (ref 4.0–10.5)

## 2017-01-13 LAB — LIPID PANEL
Cholesterol: 155 mg/dL (ref 0–200)
HDL: 72.2 mg/dL (ref >39.00)
LDL Cholesterol: 75 mg/dL (ref 0–99)
NONHDL: 82.59
TRIGLYCERIDES: 40 mg/dL (ref 0.0–149.0)
Total CHOL/HDL Ratio: 2
VLDL: 8 mg/dL (ref 0.0–40.0)

## 2017-01-13 LAB — COMPREHENSIVE METABOLIC PANEL
ALBUMIN: 4.5 g/dL (ref 3.5–5.2)
ALT: 29 U/L (ref 0–35)
AST: 29 U/L (ref 0–37)
Alkaline Phosphatase: 57 U/L (ref 39–117)
BUN: 10 mg/dL (ref 6–23)
CALCIUM: 9.4 mg/dL (ref 8.4–10.5)
CO2: 31 mEq/L (ref 19–32)
Chloride: 106 mEq/L (ref 96–112)
Creatinine, Ser: 0.6 mg/dL (ref 0.40–1.20)
GFR: 117.18 mL/min (ref >60.00)
GLUCOSE: 106 mg/dL — AB (ref 70–99)
POTASSIUM: 4.2 meq/L (ref 3.5–5.1)
Sodium: 141 mEq/L (ref 135–145)
TOTAL PROTEIN: 6.8 g/dL (ref 6.0–8.3)
Total Bilirubin: 0.5 mg/dL (ref 0.2–1.2)

## 2017-01-13 LAB — TSH: TSH: 1.27 u[IU]/mL (ref 0.35–4.50)

## 2017-01-13 NOTE — Patient Instructions (Signed)
Health Maintenance, Female Adopting a healthy lifestyle and getting preventive care can go a long way to promote health and wellness. Talk with your health care provider about what schedule of regular examinations is right for you. This is a good chance for you to check in with your provider about disease prevention and staying healthy. In between checkups, there are plenty of things you can do on your own. Experts have done a lot of research about which lifestyle changes and preventive measures are most likely to keep you healthy. Ask your health care provider for more information. Weight and diet Eat a healthy diet  Be sure to include plenty of vegetables, fruits, low-fat dairy products, and lean protein.  Do not eat a lot of foods high in solid fats, added sugars, or salt.  Get regular exercise. This is one of the most important things you can do for your health.  Most adults should exercise for at least 150 minutes each week. The exercise should increase your heart rate and make you sweat (moderate-intensity exercise).  Most adults should also do strengthening exercises at least twice a week. This is in addition to the moderate-intensity exercise. Maintain a healthy weight  Body mass index (BMI) is a measurement that can be used to identify possible weight problems. It estimates body fat based on height and weight. Your health care provider can help determine your BMI and help you achieve or maintain a healthy weight.  For females 76 years of age and older:  A BMI below 18.5 is considered underweight.  A BMI of 18.5 to 24.9 is normal.  A BMI of 25 to 29.9 is considered overweight.  A BMI of 30 and above is considered obese. Watch levels of cholesterol and blood lipids  You should start having your blood tested for lipids and cholesterol at 41 years of age, then have this test every 5 years.  You may need to have your cholesterol levels checked more often if:  Your lipid or  cholesterol levels are high.  You are older than 41 years of age.  You are at high risk for heart disease. Cancer screening Lung Cancer  Lung cancer screening is recommended for adults 64-42 years old who are at high risk for lung cancer because of a history of smoking.  A yearly low-dose CT scan of the lungs is recommended for people who:  Currently smoke.  Have quit within the past 15 years.  Have at least a 30-pack-year history of smoking. A pack year is smoking an average of one pack of cigarettes a day for 1 year.  Yearly screening should continue until it has been 15 years since you quit.  Yearly screening should stop if you develop a health problem that would prevent you from having lung cancer treatment. Breast Cancer  Practice breast self-awareness. This means understanding how your breasts normally appear and feel.  It also means doing regular breast self-exams. Let your health care provider know about any changes, no matter how small.  If you are in your 20s or 30s, you should have a clinical breast exam (CBE) by a health care provider every 1-3 years as part of a regular health exam.  If you are 34 or older, have a CBE every year. Also consider having a breast X-ray (mammogram) every year.  If you have a family history of breast cancer, talk to your health care provider about genetic screening.  If you are at high risk for breast cancer, talk  to your health care provider about having an MRI and a mammogram every year.  Breast cancer gene (BRCA) assessment is recommended for women who have family members with BRCA-related cancers. BRCA-related cancers include:  Breast.  Ovarian.  Tubal.  Peritoneal cancers.  Results of the assessment will determine the need for genetic counseling and BRCA1 and BRCA2 testing. Cervical Cancer  Your health care provider may recommend that you be screened regularly for cancer of the pelvic organs (ovaries, uterus, and vagina).  This screening involves a pelvic examination, including checking for microscopic changes to the surface of your cervix (Pap test). You may be encouraged to have this screening done every 3 years, beginning at age 24.  For women ages 66-65, health care providers may recommend pelvic exams and Pap testing every 3 years, or they may recommend the Pap and pelvic exam, combined with testing for human papilloma virus (HPV), every 5 years. Some types of HPV increase your risk of cervical cancer. Testing for HPV may also be done on women of any age with unclear Pap test results.  Other health care providers may not recommend any screening for nonpregnant women who are considered low risk for pelvic cancer and who do not have symptoms. Ask your health care provider if a screening pelvic exam is right for you.  If you have had past treatment for cervical cancer or a condition that could lead to cancer, you need Pap tests and screening for cancer for at least 20 years after your treatment. If Pap tests have been discontinued, your risk factors (such as having a new sexual partner) need to be reassessed to determine if screening should resume. Some women have medical problems that increase the chance of getting cervical cancer. In these cases, your health care provider may recommend more frequent screening and Pap tests. Colorectal Cancer  This type of cancer can be detected and often prevented.  Routine colorectal cancer screening usually begins at 41 years of age and continues through 41 years of age.  Your health care provider may recommend screening at an earlier age if you have risk factors for colon cancer.  Your health care provider may also recommend using home test kits to check for hidden blood in the stool.  A small camera at the end of a tube can be used to examine your colon directly (sigmoidoscopy or colonoscopy). This is done to check for the earliest forms of colorectal cancer.  Routine  screening usually begins at age 41.  Direct examination of the colon should be repeated every 5-10 years through 41 years of age. However, you may need to be screened more often if early forms of precancerous polyps or small growths are found. Skin Cancer  Check your skin from head to toe regularly.  Tell your health care provider about any new moles or changes in moles, especially if there is a change in a mole's shape or color.  Also tell your health care provider if you have a mole that is larger than the size of a pencil eraser.  Always use sunscreen. Apply sunscreen liberally and repeatedly throughout the day.  Protect yourself by wearing long sleeves, pants, a wide-brimmed hat, and sunglasses whenever you are outside. Heart disease, diabetes, and high blood pressure  High blood pressure causes heart disease and increases the risk of stroke. High blood pressure is more likely to develop in:  People who have blood pressure in the high end of the normal range (130-139/85-89 mm Hg).  People who are overweight or obese.  People who are African American.  If you are 59-24 years of age, have your blood pressure checked every 3-5 years. If you are 34 years of age or older, have your blood pressure checked every year. You should have your blood pressure measured twice-once when you are at a hospital or clinic, and once when you are not at a hospital or clinic. Record the average of the two measurements. To check your blood pressure when you are not at a hospital or clinic, you can use:  An automated blood pressure machine at a pharmacy.  A home blood pressure monitor.  If you are between 29 years and 60 years old, ask your health care provider if you should take aspirin to prevent strokes.  Have regular diabetes screenings. This involves taking a blood sample to check your fasting blood sugar level.  If you are at a normal weight and have a low risk for diabetes, have this test once  every three years after 41 years of age.  If you are overweight and have a high risk for diabetes, consider being tested at a younger age or more often. Preventing infection Hepatitis B  If you have a higher risk for hepatitis B, you should be screened for this virus. You are considered at high risk for hepatitis B if:  You were born in a country where hepatitis B is common. Ask your health care provider which countries are considered high risk.  Your parents were born in a high-risk country, and you have not been immunized against hepatitis B (hepatitis B vaccine).  You have HIV or AIDS.  You use needles to inject street drugs.  You live with someone who has hepatitis B.  You have had sex with someone who has hepatitis B.  You get hemodialysis treatment.  You take certain medicines for conditions, including cancer, organ transplantation, and autoimmune conditions. Hepatitis C  Blood testing is recommended for:  Everyone born from [birth year] through 1965.  Anyone with known risk factors for hepatitis C. Sexually transmitted infections (STIs)  You should be screened for sexually transmitted infections (STIs) including gonorrhea and chlamydia if:  You are sexually active and are younger than 41 years of age.  You are older than 41 years of age and your health care provider tells you that you are at risk for this type of infection.  Your sexual activity has changed since you were last screened and you are at an increased risk for chlamydia or gonorrhea. Ask your health care provider if you are at risk.  If you do not have HIV, but are at risk, it may be recommended that you take a prescription medicine daily to prevent HIV infection. This is called pre-exposure prophylaxis (PrEP). You are considered at risk if:  You are sexually active and do not regularly use condoms or know the HIV status of your partner(s).  You take drugs by injection.  You are sexually active with a partner  who has HIV. Talk with your health care provider about whether you are at high risk of being infected with HIV. If you choose to begin PrEP, you should first be tested for HIV. You should then be tested every 3 months for as long as you are taking PrEP. Pregnancy  If you are premenopausal and you may become pregnant, ask your health care provider about preconception counseling.  If you may become pregnant, take 400 to 800 micrograms (mcg) of folic acid  every day.  If you want to prevent pregnancy, talk to your health care provider about birth control (contraception). Osteoporosis and menopause  Osteoporosis is a disease in which the bones lose minerals and strength with aging. This can result in serious bone fractures. Your risk for osteoporosis can be identified using a bone density scan.  If you are 4 years of age or older, or if you are at risk for osteoporosis and fractures, ask your health care provider if you should be screened.  Ask your health care provider whether you should take a calcium or vitamin D supplement to lower your risk for osteoporosis.  Menopause may have certain physical symptoms and risks.  Hormone replacement therapy may reduce some of these symptoms and risks. Talk to your health care provider about whether hormone replacement therapy is right for you. Follow these instructions at home:  Schedule regular health, dental, and eye exams.  Stay current with your immunizations.  Do not use any tobacco products including cigarettes, chewing tobacco, or electronic cigarettes.  If you are pregnant, do not drink alcohol.  If you are breastfeeding, limit how much and how often you drink alcohol.  Limit alcohol intake to no more than 1 drink per day for nonpregnant women. One drink equals 12 ounces of beer, 5 ounces of wine, or 1 ounces of hard liquor.  Do not use street drugs.  Do not share needles.  Ask your health care provider for help if you need support  or information about quitting drugs.  Tell your health care provider if you often feel depressed.  Tell your health care provider if you have ever been abused or do not feel safe at home. This information is not intended to replace advice given to you by your health care provider. Make sure you discuss any questions you have with your health care provider. Document Released: 04/15/2011 Document Revised: 03/07/2016 Document Reviewed: 07/04/2015 Elsevier Interactive Patient Education  2017 [employer].

## 2017-01-13 NOTE — Progress Notes (Signed)
Office Note 01/13/2017  CC:  Chief Complaint  Patient presents with  . Annual Exam    Pt is fasting.     HPI:  Michelle Myers is a 41 y.o.  female who is here for annual health maintenance exam. No acute complaints.  GYN: has f/u appt soon with Dr. Vincente Poli. She has scheduled a mammogram already.  Eyes: no exam in last couple years. UTD on dental preventative visits.  Exercise: usually a consistent exerciser but last 3 weeks none. Diet: eating low Na diet.   Past Medical History:  Diagnosis Date  . Anxiety   . Elevated transaminase level    likely secondary to alcohol  . Hypertension   . Tobacco dependence     Past Surgical History:  Procedure Laterality Date  . CATARACT EXTRACTION W/ INTRAOCULAR LENS  IMPLANT, BILATERAL  2008 and 2010  . CERVICAL CONE BIOPSY     for HPV  . WISDOM TOOTH EXTRACTION      Family History  Problem Relation Age of Onset  . Prostate cancer Father   . Hypertension Father   . Brain cancer Daughter     neurofibromatosis, optic neurogliomas  . Diabetes Maternal Uncle   . Heart disease Maternal Grandmother     CBAG in late 59s  . Hypertension Maternal Grandmother   . Heart attack Maternal Grandfather 60  . Stroke Neg Hx   . Arthritis Mother   . Hypertension Mother   . Alcohol abuse Brother     half brother, maternal  . Mental illness Paternal Grandmother   . Heart disease Paternal Grandfather   . Heart attack Brother 51    half brother, maternal    Social History   Social History  . Marital status: Married    Spouse name: N/A  . Number of children: N/A  . Years of education: N/A   Occupational History  . Not on file.   Social History Main Topics  . Smoking status: Current Every Day Smoker    Packs/day: 1.00    Years: 24.00    Types: Cigarettes  . Smokeless tobacco: Never Used     Comment: smoked 1992-present , up to 1.5 ppd  . Alcohol use 0.0 oz/week     Comment: 1.5 bottle of wine / night  . Drug use: No  .  Sexual activity: Yes    Birth control/ protection: Pill   Other Topics Concern  . Not on file   Social History Narrative   Married, 2 daughters.   Educ: Bachelor's degree   Occupation: Stay at home mom.   Youngest daughter has NF type 1 (age 82 as of 12/2015)   Tob: 24 pack-yr hx.   Alc: 1 and 1/2 bottles of wine per night.  +FH of alcoholism (2 brothers).       Outpatient Medications Prior to Visit  Medication Sig Dispense Refill  . amLODipine (NORVASC) 10 MG tablet Take 1 tablet (10 mg total) by mouth daily. 30 tablet 11  . aspirin 81 MG tablet Take 81 mg by mouth daily.    . carvedilol (COREG) 25 MG tablet Take 1 tablet (25 mg total) by mouth 2 (two) times daily with a meal. 60 tablet 6  . clonazePAM (KLONOPIN) 1 MG tablet Take 1 tablet (1 mg total) by mouth 2 (two) times daily as needed for anxiety. 60 tablet 3  . hydrochlorothiazide (HYDRODIURIL) 25 MG tablet Take 1 tablet (25 mg total) by mouth daily. 90 tablet 3  .  irbesartan (AVAPRO) 75 MG tablet Take 1 tablet (75 mg total) by mouth daily. 30 tablet 6  . mometasone (ELOCON) 0.1 % ointment Apply bid 45 g 0  . norethindrone (CAMILA) 0.35 MG tablet Take 1 tablet by mouth daily.    . sertraline (ZOLOFT) 100 MG tablet Take 1 tablet (100 mg total) by mouth daily. 90 tablet 1  . ciprofloxacin (CIPRO) 500 MG tablet Take 1 tablet (500 mg total) by mouth 2 (two) times daily. (Patient not taking: Reported on 01/13/2017) 28 tablet 0  . metroNIDAZOLE (FLAGYL) 500 MG tablet Take 1 tablet (500 mg total) by mouth 3 (three) times daily. (Patient not taking: Reported on 01/13/2017) 42 tablet 0   No facility-administered medications prior to visit.     Allergies  Allergen Reactions  . Lisinopril     Angioedema  . Sulfa Antibiotics      ? reaction    ROS Review of Systems  Constitutional: Negative for appetite change, chills, fatigue and fever.  HENT: Negative for congestion, dental problem, ear pain and sore throat.   Eyes: Negative for  discharge, redness and visual disturbance.  Respiratory: Negative for cough, chest tightness, shortness of breath and wheezing.   Cardiovascular: Negative for chest pain, palpitations and leg swelling.  Gastrointestinal: Negative for abdominal pain, blood in stool, diarrhea, nausea and vomiting.  Genitourinary: Negative for difficulty urinating, dysuria, flank pain, frequency, hematuria and urgency.  Musculoskeletal: Negative for arthralgias, back pain, joint swelling, myalgias and neck stiffness.  Skin: Negative for pallor and rash.  Neurological: Negative for dizziness, speech difficulty, weakness and headaches.  Hematological: Negative for adenopathy. Does not bruise/bleed easily.  Psychiatric/Behavioral: Negative for confusion and sleep disturbance. The patient is not nervous/anxious.     PE; Blood pressure 134/90, pulse 81, temperature 98.6 F (37 C), temperature source Oral, resp. rate 16, height  (1.651 m), weight 140 lb 8 oz (63.7 kg), last menstrual period 01/02/2017, SpO2 100 %. Body mass index is 23.38 kg/m.  Gen: Alert, well appearing.  Patient is oriented to person, place, time, and situation. AFFECT: pleasant, lucid thought and speech. ENT: Ears: EACs clear, normal epithelium.  TMs with good light reflex and landmarks bilaterally.  Eyes: no injection, icteris, swelling, or exudate.  EOMI, PERRLA. Nose: no drainage or turbinate edema/swelling.  No injection or focal lesion.  Mouth: lips without lesion/swelling.  Oral mucosa pink and moist.  Dentition intact and without obvious caries or gingival swelling.  Oropharynx without erythema, exudate, or swelling.  Neck: supple/nontender.  No LAD, mass, or TM.  Carotid pulses 2+ bilaterally, without bruits. CV: RRR, no m/r/g.   LUNGS: CTA bilat, nonlabored resps, good aeration in all lung fields. ABD: soft, NT, ND, BS normal.  No hepatospenomegaly or mass.  No bruits. EXT: no clubbing, cyanosis, or edema.  Musculoskeletal: no  joint swelling, erythema, warmth, or tenderness.  ROM of all joints intact. Skin - no sores or suspicious lesions or rashes or color changes  Pertinent labs:  Lab Results  Component Value Date   TSH 1.10 01/01/2016   Lab Results  Component Value Date   WBC 8.9 06/05/2016   HGB 13.9 06/05/2016   HCT 40.8 06/05/2016   MCV 97.8 06/05/2016   PLT 248.0 06/05/2016   Lab Results  Component Value Date   CREATININE 0.66 09/13/2016   BUN 12 09/13/2016   NA 138 09/13/2016   K 4.5 09/13/2016   CL 100 09/13/2016   CO2 30 09/13/2016   Lab Results  Component Value Date   ALT 15 09/13/2016   AST 20 09/13/2016   ALKPHOS 55 09/13/2016   BILITOT 0.6 09/13/2016   Lab Results  Component Value Date   CHOL 147 01/01/2016   Lab Results  Component Value Date   HDL 49.20 01/01/2016   Lab Results  Component Value Date   LDLCALC 87 01/01/2016   Lab Results  Component Value Date   TRIG 50.0 01/01/2016   Lab Results  Component Value Date   CHOLHDL 3 01/01/2016   Lab Results  Component Value Date   HGBA1C 5.4 10/10/2014    ASSESSMENT AND PLAN:   Health maintenance exam: Reviewed age and gender appropriate health maintenance issues (prudent diet, regular exercise, health risks of tobacco and excessive alcohol, use of seatbelts, fire alarms in home, use of sunscreen).  Also reviewed age and gender appropriate health screening as well as vaccine recommendations. Vaccines UTD. Fasting HP labs drawn today. Cervical cancer screening: to do at Dr. Lynnell Dike soon. Breast cancer screening: she has mammogram scheduled.  An After Visit Summary was printed and given to the patient.  FOLLOW UP:  Return in about 6 months (around 07/15/2017) for routine chronic illness f/u.  Signed:  Santiago Bumpers, MD           01/13/2017

## 2017-01-13 NOTE — Progress Notes (Signed)
Pre visit review using our clinic review tool, if applicable. No additional management support is needed unless otherwise documented below in the visit note. 

## 2017-01-14 ENCOUNTER — Encounter: Payer: Self-pay | Admitting: *Deleted

## 2017-02-15 ENCOUNTER — Other Ambulatory Visit: Payer: Self-pay | Admitting: Family Medicine

## 2017-03-21 ENCOUNTER — Other Ambulatory Visit: Payer: Self-pay | Admitting: Family Medicine

## 2017-03-24 ENCOUNTER — Other Ambulatory Visit: Payer: Self-pay | Admitting: Family Medicine

## 2017-03-24 MED ORDER — CLONAZEPAM 1 MG PO TABS: 1.0000 mg | ORAL_TABLET | Freq: Two times a day (BID) | ORAL | 5 refills | Status: DC | PRN

## 2017-03-24 NOTE — Telephone Encounter (Signed)
[facility]

## 2017-03-24 NOTE — Telephone Encounter (Signed)
Rx faxed

## 2017-03-24 NOTE — Telephone Encounter (Signed)
RF request for clonzapam LOV: 01/13/17 Next ov: 07/15/17 Last written: 10/22/16 #60 w/ 3RF  Please advise. Thanks.

## 2017-04-07 ENCOUNTER — Other Ambulatory Visit: Payer: Self-pay | Admitting: Family Medicine

## 2017-04-07 MED ORDER — AMLODIPINE BESYLATE 10 MG PO TABS
10.0000 mg | ORAL_TABLET | Freq: Every day | ORAL | 3 refills | Status: DC
Start: 2017-04-07 — End: 2017-07-31

## 2017-06-23 ENCOUNTER — Other Ambulatory Visit: Payer: Self-pay | Admitting: Family Medicine

## 2017-06-23 DIAGNOSIS — Z1231 Encounter for screening mammogram for malignant neoplasm of breast: Secondary | ICD-10-CM | POA: Diagnosis not present

## 2017-06-23 DIAGNOSIS — Z01419 Encounter for gynecological examination (general) (routine) without abnormal findings: Secondary | ICD-10-CM | POA: Diagnosis not present

## 2017-06-23 DIAGNOSIS — Z6825 Body mass index (BMI) 25.0-25.9, adult: Secondary | ICD-10-CM | POA: Diagnosis not present

## 2017-06-23 DIAGNOSIS — Z1212 Encounter for screening for malignant neoplasm of rectum: Secondary | ICD-10-CM | POA: Diagnosis not present

## 2017-06-23 NOTE — Telephone Encounter (Signed)
[facility] 

## 2017-06-24 LAB — HM PAP SMEAR: HM Pap smear: NORMAL

## 2017-07-04 ENCOUNTER — Other Ambulatory Visit: Payer: Self-pay | Admitting: Family Medicine

## 2017-07-06 ENCOUNTER — Other Ambulatory Visit: Payer: Self-pay | Admitting: Family Medicine

## 2017-07-15 ENCOUNTER — Encounter: Payer: Self-pay | Admitting: Family Medicine

## 2017-07-15 ENCOUNTER — Ambulatory Visit (INDEPENDENT_AMBULATORY_CARE_PROVIDER_SITE_OTHER): Payer: BLUE CROSS/BLUE SHIELD | Admitting: Family Medicine

## 2017-07-15 VITALS — BP 115/78 | HR 88 | Temp 98.8°F | Resp 16 | Ht 65.0 in | Wt 150.2 lb

## 2017-07-15 DIAGNOSIS — F1020 Alcohol dependence, uncomplicated: Secondary | ICD-10-CM | POA: Diagnosis not present

## 2017-07-15 DIAGNOSIS — F4000 Agoraphobia, unspecified: Secondary | ICD-10-CM

## 2017-07-15 DIAGNOSIS — F331 Major depressive disorder, recurrent, moderate: Secondary | ICD-10-CM | POA: Diagnosis not present

## 2017-07-15 DIAGNOSIS — F411 Generalized anxiety disorder: Secondary | ICD-10-CM | POA: Diagnosis not present

## 2017-07-15 DIAGNOSIS — I10 Essential (primary) hypertension: Secondary | ICD-10-CM | POA: Diagnosis not present

## 2017-07-15 LAB — COMPREHENSIVE METABOLIC PANEL
ALK PHOS: 55 U/L (ref 39–117)
ALT: 40 U/L — AB (ref 0–35)
AST: 43 U/L — AB (ref 0–37)
Albumin: 4.6 g/dL (ref 3.5–5.2)
BILIRUBIN TOTAL: 0.4 mg/dL (ref 0.2–1.2)
BUN: 8 mg/dL (ref 6–23)
CO2: 30 mEq/L (ref 19–32)
Calcium: 9.4 mg/dL (ref 8.4–10.5)
Chloride: 100 mEq/L (ref 96–112)
Creatinine, Ser: 0.5 mg/dL (ref 0.40–1.20)
GFR: 144.26 mL/min (ref >60.00)
GLUCOSE: 94 mg/dL (ref 70–99)
Potassium: 4.2 mEq/L (ref 3.5–5.1)
SODIUM: 138 meq/L (ref 135–145)
TOTAL PROTEIN: 7.5 g/dL (ref 6.0–8.3)

## 2017-07-15 MED ORDER — SERTRALINE HCL 100 MG PO TABS: ORAL_TABLET | ORAL | 1 refills | Status: DC

## 2017-07-15 NOTE — Progress Notes (Signed)
OFFICE VISIT  07/15/2017   CC:  Chief Complaint  Patient presents with  . Follow-up    RCI, pt is fasting.    HPI:    Patient is a 41 y.o. Caucasian female who presents for 6 mo f/u HTN, chronic anxiety with hx of alcohol abuse, and tob dependence.    HTN:  bp normal at recent GYN office visit.  No home monitoring but compliant with all meds.  Anxiety: not doing well, says sertraline not helping.  Describes some agoraphobia and some isolating behavior.   Starts crying talking about it in office.  +Depressed.  No SI or HI.  Very tired.  Compliant with sertraline and clonazepam. No counseling right now.  No signif side effects from sertraline. Alcohol: drinks a bottle and 1/2 of wine per day.  She knows what she's doing is wrong but admits she is not ready to quit. Says 2 family members have alcoholism.  Still smoking as well.  Not contemplating quitting.   Past Medical History:  Diagnosis Date  . Anxiety   . Elevated transaminase level    likely secondary to alcohol  . Hypertension   . Tobacco dependence     Past Surgical History:  Procedure Laterality Date  . CATARACT EXTRACTION W/ INTRAOCULAR LENS  IMPLANT, BILATERAL  2008 and 2010  . CERVICAL CONE BIOPSY     for HPV  . WISDOM TOOTH EXTRACTION      Outpatient Medications Prior to Visit  Medication Sig Dispense Refill  . amLODipine (NORVASC) 10 MG tablet Take 1 tablet (10 mg total) by mouth daily. 30 tablet 3  . aspirin 81 MG tablet Take 81 mg by mouth daily.    . carvedilol (COREG) 25 MG tablet TAKE 1 TABLET (25 MG TOTAL) BY MOUTH 2 (TWO) TIMES DAILY WITH A MEAL. 60 tablet 6  . clonazePAM (KLONOPIN) 1 MG tablet Take 1 tablet (1 mg total) by mouth 2 (two) times daily as needed for anxiety. 60 tablet 5  . hydrochlorothiazide (HYDRODIURIL) 25 MG tablet Take 1 tablet (25 mg total) by mouth daily. 90 tablet 3  . irbesartan (AVAPRO) 75 MG tablet TAKE 1 TABLET (75 MG TOTAL) BY MOUTH DAILY. 30 tablet 5  . mometasone  (ELOCON) 0.1 % ointment Apply bid 45 g 0  . norethindrone (CAMILA) 0.35 MG tablet Take 1 tablet by mouth daily.    . sertraline (ZOLOFT) 100 MG tablet TAKE 1 TABLET BY MOUTH EVERY DAY 90 tablet 1   No facility-administered medications prior to visit.     Allergies  Allergen Reactions  . Lisinopril     Angioedema  . Sulfa Antibiotics      ? reaction    ROS As per HPI  PE: Blood pressure 115/78, pulse 88, temperature 98.8 F (37.1 C), temperature source Oral, resp. rate 16, height  (1.651 m), weight 150 lb 4 oz (68.2 kg), last menstrual period 07/01/2017, SpO2 99 %. Gen: Alert, well appearing.  Patient is oriented to person, place, time, and situation. AFFECT: pleasant but gets emotional easily, lucid thought and speech. No further exam today.  LABS:    Chemistry      Component Value Date/Time   NA 141 01/13/2017 0923   K 4.2 01/13/2017 0923   CL 106 01/13/2017 0923   CO2 31 01/13/2017 0923   BUN 10 01/13/2017 0923   CREATININE 0.60 01/13/2017 0923      Component Value Date/Time   CALCIUM 9.4 01/13/2017 0923   ALKPHOS 57  01/13/2017 0923   AST 29 01/13/2017 0923   ALT 29 01/13/2017 0923   BILITOT 0.5 01/13/2017 0923     Lab Results  Component Value Date   TSH 1.27 01/13/2017   Lab Results  Component Value Date   WBC 5.3 01/13/2017   HGB 13.6 01/13/2017   HCT 40.0 01/13/2017   MCV 100.0 01/13/2017   PLT 245.0 01/13/2017   Lab Results  Component Value Date   CHOL 155 01/13/2017   HDL 72.20 01/13/2017   LDLCALC 75 01/13/2017   TRIG 40.0 01/13/2017   CHOLHDL 2 01/13/2017    IMPRESSION AND PLAN:  1) HTN: The current medical regimen is effective;  continue present plan and medications. Try to home monitor at least once q 2 weeks. Lytes/cr today.  2) MDD, recurrent, moderate, without psychotic features.   Additionally, has severe anxiety, with agoraphobia manifesting quite a bit lately. This is all complicated by her alcoholism. She agrees to  referral for counseling today: Dr. Dewayne Hatch. Increase zoloft to  qd x 7d, then increase to 200 mg qd.  3) Alcoholism: hx of elevated transaminases.  Recheck hepatic panel today. Encouraged pt to enter rehab program or try to cut back slowly on her own but she admits she is not going to try either of these options right now.  4) Tobacco dependence: ongoing.  Encouraged complete cessation. Pt not contemplating quitting at this time.  An After Visit Summary was printed and given to the patient.   FOLLOW UP: Return in about 4 weeks (around 08/12/2017) for f/u mood/anx.   Signed:  Santiago Bumpers, MD           07/15/2017

## 2017-07-16 ENCOUNTER — Encounter: Payer: Self-pay | Admitting: *Deleted

## 2017-07-31 ENCOUNTER — Other Ambulatory Visit: Payer: Self-pay | Admitting: *Deleted

## 2017-07-31 MED ORDER — AMLODIPINE BESYLATE 10 MG PO TABS: 10.0000 mg | ORAL_TABLET | Freq: Every day | ORAL | 5 refills | Status: DC

## 2017-08-20 ENCOUNTER — Ambulatory Visit: Payer: BLUE CROSS/BLUE SHIELD | Admitting: Family Medicine

## 2017-09-01 ENCOUNTER — Other Ambulatory Visit: Payer: Self-pay | Admitting: Family Medicine

## 2017-09-09 ENCOUNTER — Telehealth: Payer: Self-pay | Admitting: *Deleted

## 2017-09-09 MED ORDER — SERTRALINE HCL 100 MG PO TABS: ORAL_TABLET | ORAL | 0 refills | Status: DC

## 2017-09-10 ENCOUNTER — Other Ambulatory Visit: Payer: Self-pay | Admitting: *Deleted

## 2017-09-10 MED ORDER — CARVEDILOL 25 MG PO TABS: 25.0000 mg | ORAL_TABLET | Freq: Two times a day (BID) | ORAL | 1 refills | Status: DC

## 2017-09-11 NOTE — Telephone Encounter (Signed)
Tried calling pt, NA and unable to leave a message due to vm box not being set up.

## 2017-09-13 DIAGNOSIS — S93402A Sprain of unspecified ligament of left ankle, initial encounter: Secondary | ICD-10-CM | POA: Diagnosis not present

## 2017-09-13 HISTORY — DX: Sprain of unspecified ligament of left ankle, initial encounter: S93.402A

## 2017-09-14 ENCOUNTER — Other Ambulatory Visit: Payer: Self-pay | Admitting: Family Medicine

## 2017-09-15 ENCOUNTER — Encounter: Payer: Self-pay | Admitting: Family Medicine

## 2017-09-15 NOTE — Telephone Encounter (Signed)
Tried calling pt, NA and unable to leave a message due to vm box not being set up.   Okay for P[facility] to advise and schedule f/u apt.

## 2017-09-19 ENCOUNTER — Encounter: Payer: Self-pay | Admitting: *Deleted

## 2017-09-19 NOTE — Telephone Encounter (Signed)
Pt has not returned calls or read [employer] Group[employer]chart message. Will mail letter to address in EMR.

## 2017-10-01 ENCOUNTER — Other Ambulatory Visit: Payer: Self-pay | Admitting: Family Medicine

## 2017-10-02 NOTE — Telephone Encounter (Signed)
OK, rx printed.   Pt needs o/v for follow up in 3-4 months. Did she ever get in contact with Dr. Dewayne HatchMendelson, our psychologist?

## 2017-10-02 NOTE — Telephone Encounter (Signed)
[facility]  RF request for clonazepam LOV: 07/15/17 Next ov:  None Last written: 03/24/17 #60 w/ 5RF  Please advise. Thanks.

## 2017-10-03 NOTE — Telephone Encounter (Signed)
Message sent via MyChart. Unable to reach patient by phone.

## 2017-10-09 NOTE — Telephone Encounter (Signed)
MyChart message read.

## 2017-12-14 ENCOUNTER — Other Ambulatory Visit: Payer: Self-pay | Admitting: Family Medicine

## 2018-01-08 ENCOUNTER — Other Ambulatory Visit: Payer: Self-pay | Admitting: Family Medicine

## 2018-01-09 NOTE — Telephone Encounter (Signed)
[facility]  RF request for carvedilol LOV: 07/15/17 Next ov: None Last written: carvedilol  Please advise. Thanks.

## 2018-01-09 NOTE — Telephone Encounter (Signed)
Will do RF. Pt needs office follow up in 3 months.-thx

## 2018-01-13 NOTE — Telephone Encounter (Signed)
Tried to call pt, NA and unable to leave a message due to vm box not being set up yet. 

## 2018-01-16 NOTE — Telephone Encounter (Signed)
Tried to call pt, NA and unable to leave a message due to vm box not being set up yet.

## 2018-01-20 ENCOUNTER — Encounter: Payer: Self-pay | Admitting: *Deleted

## 2018-01-20 NOTE — Telephone Encounter (Signed)
Letter mailed to pt.  

## 2018-01-25 ENCOUNTER — Other Ambulatory Visit: Payer: Self-pay | Admitting: Family Medicine

## 2018-01-26 ENCOUNTER — Telehealth: Payer: Self-pay | Admitting: Family Medicine

## 2018-01-26 NOTE — Telephone Encounter (Signed)
Copied from CRM [id number]. Topic: Quick Communication - Rx Refill/Question >> Jan 26, 2018  5:09 PM Landry MellowFoltz, Melissa J wrote: Medication: irbesartan (AVAPRO) 75 MG tablet Has the patient contacted their pharmacy? Yes.   (Agent: If no, request that the patient contact the pharmacy for the refill.) Preferred Pharmacy (with phone number or street name): [facility]  Agent: Please be advised that RX refills may take up to 3 business days. We ask that you follow-up with your pharmacy.

## 2018-01-26 NOTE — Telephone Encounter (Signed)
Pt has failed to f/u as instructed. No RF of this med until I see her for office f/u (30 min pls---she was not doing well the last time I saw her, so I think I'll need more time with her).-thx

## 2018-01-26 NOTE — Telephone Encounter (Signed)
[facility]  RF request for sertraline LOV: 07/15/17 Next ov: None Last written: 09/09/17 #180 w/ 0RF  Please advise. Thanks.

## 2018-01-27 NOTE — Telephone Encounter (Signed)
Two letters have been sent to patient regarding needing appointment before medication refilled.  Patient has an appointment tomorrow and will refill at this time.

## 2018-01-27 NOTE — Telephone Encounter (Signed)
Pt scheduled an apt for 01/28/18 at 9:15am.

## 2018-01-27 NOTE — Telephone Encounter (Signed)
Patient has an appointment 01/28/18.

## 2018-01-27 NOTE — Telephone Encounter (Signed)
Patient notified and is in agreement.

## 2018-01-28 ENCOUNTER — Encounter: Payer: Self-pay | Admitting: Family Medicine

## 2018-01-28 ENCOUNTER — Ambulatory Visit: Payer: BLUE CROSS/BLUE SHIELD | Admitting: Family Medicine

## 2018-01-28 VITALS — BP 121/84 | HR 82 | Resp 16 | Ht 65.0 in | Wt 173.0 lb

## 2018-01-28 DIAGNOSIS — F4000 Agoraphobia, unspecified: Secondary | ICD-10-CM | POA: Diagnosis not present

## 2018-01-28 DIAGNOSIS — I10 Essential (primary) hypertension: Secondary | ICD-10-CM

## 2018-01-28 DIAGNOSIS — S93402A Sprain of unspecified ligament of left ankle, initial encounter: Secondary | ICD-10-CM | POA: Diagnosis not present

## 2018-01-28 DIAGNOSIS — Z79899 Other long term (current) drug therapy: Secondary | ICD-10-CM | POA: Diagnosis not present

## 2018-01-28 DIAGNOSIS — F1020 Alcohol dependence, uncomplicated: Secondary | ICD-10-CM

## 2018-01-28 DIAGNOSIS — F411 Generalized anxiety disorder: Secondary | ICD-10-CM

## 2018-01-28 DIAGNOSIS — F331 Major depressive disorder, recurrent, moderate: Secondary | ICD-10-CM | POA: Diagnosis not present

## 2018-01-28 LAB — COMPREHENSIVE METABOLIC PANEL
ALBUMIN: 4.4 g/dL (ref 3.5–5.2)
ALT: 45 U/L — AB (ref 0–35)
AST: 60 U/L — AB (ref 0–37)
Alkaline Phosphatase: 59 U/L (ref 39–117)
BILIRUBIN TOTAL: 0.4 mg/dL (ref 0.2–1.2)
BUN: 9 mg/dL (ref 6–23)
CALCIUM: 9.2 mg/dL (ref 8.4–10.5)
CO2: 28 mEq/L (ref 19–32)
CREATININE: 0.59 mg/dL (ref 0.40–1.20)
Chloride: 99 mEq/L (ref 96–112)
GFR: 118.86 mL/min (ref >60.00)
Glucose, Bld: 147 mg/dL — ABNORMAL HIGH (ref 70–99)
Potassium: 3.9 mEq/L (ref 3.5–5.1)
Sodium: 137 mEq/L (ref 135–145)
Total Protein: 7.2 g/dL (ref 6.0–8.3)

## 2018-01-28 NOTE — Progress Notes (Signed)
OFFICE VISIT  01/28/2018   CC:  Chief Complaint  Patient presents with  . Follow-up    HTN   HPI:    Patient is a 42 y.o. Caucasian female who presents for 6 mo f/u HTN, recurrent MDD, GAD with agoraphobia, and alcoholism. Last visit she was struggling quite a bit and was supposed to have followed up with me 1 mo later but did not. At that visit we titrated her sertraline up to 200 mg qd goal.  I also encouraged her to monitor bp at home periodically. She was drinking too much and I strongly encouraged her to quit but she said she probably wasn't going to.  HTN: no home monitoring.  Compliant with med daily.    Mood/anxiety/alcohol: referred pt to Dr. Dewayne HatchMendelson- she has not seen the therapist yet---we need to try to get her into contact with Dr. Dewayne HatchMendelson. She found no improvement in increased sertraline so she cut back to 100 mg qd dose.  Still quite a bit of depressed mood, anhedonia, ISOLATING, doesn't go out of house unless she has to for an appointment for her daughter or herself. Taking clonaz 1mg  1-2 times per day.  Took it this morning and yesterday as well.  She has a daughter with brain tumors--pt says she is doing "okay" lately.  Financial issues related to this are really getting to her. Alcohol intake: drinks 1 and 1/2 bottles of wine daily.  Has not tried to quit at all lately.  She admits that she is drinking too much.  She smokes less lately.  ROS: increased napping lately.  She had a twisted ankle 08/2017.  X-rays normal.  Wore a boot for 9 weeks and says it got a lot better but still has some instability with walking down stairs.  Past Medical History:  Diagnosis Date  . Anxiety   . Elevated transaminase level    likely secondary to alcohol  . Hypertension   . Left ankle sprain 09/13/2017   [place] Priority care: x-ray NORMAL 09/13/17.  . Tobacco dependence     Past Surgical History:  Procedure Laterality Date  . CATARACT EXTRACTION W/ INTRAOCULAR LENS   IMPLANT, BILATERAL  2008 and 2010  . CERVICAL CONE BIOPSY     for HPV  . WISDOM TOOTH EXTRACTION      Outpatient Medications Prior to Visit  Medication Sig Dispense Refill  . amLODipine (NORVASC) 10 MG tablet Take 1 tablet (10 mg total) by mouth daily. 30 tablet 5  . aspirin 81 MG tablet Take 81 mg by mouth daily.    . carvedilol (COREG) 25 MG tablet TAKE 1 TABLET BY MOUTH 2 TIMES DAILY WITH A MEAL. 180 tablet 0  . clonazePAM (KLONOPIN) 1 MG tablet TAKE 1 TABLET BY MOUTH TWICE A DAY AS NEEDED FOR ANXIETY 60 tablet 5  . hydrochlorothiazide (HYDRODIURIL) 25 MG tablet TAKE 1 TABLET BY MOUTH DAILY 90 tablet 1  . irbesartan (AVAPRO) 75 MG tablet TAKE 1 TABLET (75 MG TOTAL) BY MOUTH DAILY. 30 tablet 5  . norethindrone (CAMILA) 0.35 MG tablet Take 1 tablet by mouth daily.    . sertraline (ZOLOFT) 100 MG tablet 2 tabs po qhs (Patient taking differently: 1 tabs po qhs) 180 tablet 0  . mometasone (ELOCON) 0.1 % ointment Apply bid (Patient not taking: Reported on 01/28/2018) 45 g 0   No facility-administered medications prior to visit.     Allergies  Allergen Reactions  . Lisinopril     Angioedema  .  Sulfa Antibiotics      ? reaction    ROS As per HPI  PE: Blood pressure 121/84, pulse 82, resp. rate 16, height 5\' 5"  (1.651 m), weight 173 lb (78.5 kg), SpO2 99 %. Gen: Alert, well appearing.  Patient is oriented to person, place, time, and situation. AFFECT: pleasant, lucid thought and speech. CV: RRR, no m/r/g.   LUNGS: CTA bilat, nonlabored resps, good aeration in all lung fields. EXT: no clubbing, cyanosis, or edema.  Left ankle: no swelling or tenderness.  ROM fully intact.  Neg anterior drawer and neg talar tilt.  Strength normal.  LABS:    Chemistry      Component Value Date/Time   NA 138 07/15/2017 0852   K 4.2 07/15/2017 0852   CL 100 07/15/2017 0852   CO2 30 07/15/2017 0852   BUN 8 07/15/2017 0852   CREATININE 0.50 07/15/2017 0852      Component Value Date/Time    CALCIUM 9.4 07/15/2017 0852   ALKPHOS 55 07/15/2017 0852   AST 43 (H) 07/15/2017 0852   ALT 40 (H) 07/15/2017 0852   BILITOT 0.4 07/15/2017 0852     Lab Results  Component Value Date   TSH 1.27 01/13/2017    IMPRESSION AND PLAN:  1) HTN: The current medical regimen is effective;  continue present plan and medications. Lytes/cr today.  2) Alcoholism: ongoing abuse-->this is her primary coping mechanism for dealing with her anx/dep. She says she is not contemplating quitting at this time.  I encouraged her to quit. I reiterated today that drinking alcohol in excess while taking benzo can lead to oversedation and respiratory depression. She has expressed understanding of this in the past and does so again today.  Unfortunately, I really feel like she needs benzo to help her severe chronic anxiety. Hx of mild LFT elevation. Monitor hepatic panel today.  3) MDD, recurrent.  GAD with agoraphobia as well. She continues to struggle.  No changes in meds today. We'll see if we can restart the referral process to get her in with Dr. Dewayne Hatch. No new rx's given today. Controlled substance contract reviewed with patient today.  Patient signed this and it will be placed in the chart.   UDS today.  4) Left ankle sprain 08/2017---with some residual instability. I reassured pt and printed out some home rehab exercises for her to do.   An After Visit Summary was printed and given to the patient.  FOLLOW UP: Return in about 6 months (around 07/30/2018) for annual CPE (fasting).  Signed:  Santiago Bumpers, MD           01/28/2018

## 2018-01-28 NOTE — Patient Instructions (Signed)
Chronic Ankle Instability Rehab After Surgery  Ask your health care provider which exercises are safe for you. Do exercises exactly as told by your health care provider and adjust them as directed. It is normal to feel mild stretching, pulling, tightness, or discomfort as you do these exercises, but you should stop right away if you feel sudden pain or your pain gets worse.Do not begin these exercises until told by your health care provider.  Stretching and range of motion exercises  These exercises warm up your muscles and joints and improve the movement and flexibility of your ankle. These exercises also help to relieve pain, numbness, and tingling.  Exercise A: Dorsiflexion/plantar flexion    1. Sit with your left / right knee straight or bent. Do not rest your foot on anything.  2. Flex your left / right ankle to tilt the top of your foot toward your shin.  3. Hold this position for __________ seconds.  4. Point your toes downward to tilt the top of your foot away from your shin.  5. Hold this position for __________ seconds.  Repeat __________ times with your knee straight, then __________ times with your knee bent. Complete this exercise __________ times a day.  Exercise B: Ankle alphabet    1. Sit with your left / right leg supported at the lower leg.  ? Do not rest your foot on anything.  ? Make sure your foot has room to move freely.  2. Think of your left / right foot as a paintbrush, and move your foot to trace each letter of the alphabet in the air. Keep your hip and knee still while you trace. Make the letters as large as you can without feeling discomfort.  3. Trace every letter from A to Z.  Repeat __________ times. Complete this exercise __________ times a day.  Exercise C: Gastrocsoleus    1. Sit on the floor with your left / right leg extended.  2. Loop a belt or towel around the ball of your left / right foot. The ball of your foot is on the walking surface, right under your toes.  3. Keep your  left / right ankle and foot relaxed and keep your knee straight while you use the belt or towel to pull your foot and ankle toward you. You should feel a gentle stretch behind your calf or knee.  4. Hold this position for __________ seconds.  Repeat __________ times. Complete this exercise __________ times a day.  Exercise D: Ankle dorsiflexion, active-assisted    1. Sit on a chair that is placed on a non-carpeted surface.  2. Place your left / right foot on the floor, directly under your knee. Extend your other leg for support.  3. Keeping your heel down, slide your left / right foot back toward the chair until you feel a stretch at your ankle or calf. If you do not feel a stretch, slide your buttocks forward to the edge of the chair.  4. Hold this stretch for __________ seconds.  Repeat __________ times. Complete this stretch __________ times a day.  Strengthening exercises  These exercises build strength and endurance in your ankle. Endurance is the ability to use your muscles for a long time, even after they get tired.  Exercise E: Dorsiflexion with band    1. Secure a rubber exercise band or tube to an object, like a table leg, that will not move if it is pulled on. Secure the other end around   your left / right foot.  2. Sit on the floor facing the object with your left / right foot extended. The band or tube should be slightly tense when your foot is relaxed.  3. Slowly flex your left / right ankle and toes to bring your foot toward you.  4. Hold this position for __________ seconds.  5. Let the band or tube slowly pull your foot back to the starting position.  Repeat __________ times. Complete this exercise times a day.  Exercise F: Plantar flexion with band    1. Sit on the floor with your left / right leg extended.  2. Loop a rubber exercise band or tube around the ball of your left / right foot. The ball of your foot is on the walking surface, right under your toes. The band or tube should be slightly  tense when your foot is relaxed.  3. Slowly point your toes downward, pushing them away from you.  4. Hold this position for __________ seconds.  5. Let the band or tube slowly pull your foot back to the starting position.  Repeat __________ times. Complete this exercise __________ times a day.  Exercise G: Ankle eversion with band    1. Secure one end of an exercise band or tubing to a fixed object, such as a table leg or a pole, that will stay still when the band is pulled.  2. Loop the other end of the band around the middle of your left / right foot.  3. Sit on the floor, facing the fixed object. The band should be slightly tense when your foot is relaxed.  4. Make fists with your hands and put them between your knees. This will focus your strengthening at your ankle.  5. Leading with your little toe, slowly push your banded foot outward, away from your body. The band or tube should be adding resistance.  6. Hold this position for __________ seconds.  7. Slowly return your foot to the starting position while controlling the tension in the band.  Repeat __________ times. Complete this exercise __________ times a day.  Exercise H: Ankle inversion with band    1. Secure one end of an exercise band or tubing to a fixed object, such as a table leg or a pole, that will stay still when the band is pulled.  2. Loop the other end of the band around your left / right foot, near your toes.  3. Sit on the floor, facing the fixed object. The band should be slightly tense when your foot is relaxed.  4. Make fists with your hands and put them between your knees. This will focus your strengthening at your ankle.  5. Leading with your big toe, slowly pull your banded foot inward, toward your body. The band or tube should be adding resistance.  6. Hold this position for __________ seconds.  7. Let the band or tube slowly pull your foot back to the starting position.  Repeat __________ times. Complete this exercises __________  times a day.  Exercise I: Towel curls    1. Sit in a chair on a non-carpeted surface, and put your feet on the floor.  2. Place a towel in front of your feet. If told by your health care provider, add __________ to the end of the towel.  3. Keeping your heel on the floor, put your left / right footon the towel.  4. Pull the towel toward you by grabbing the towel with   your toes and curling them under. Keep your heel on the floor.  Repeat __________ times. Complete this exercise __________ times a day.  This information is not intended to replace advice given to you by your health care provider. Make sure you discuss any questions you have with your health care provider.  Document Released: 01/22/2016 Document Revised: 06/06/2016 Document Reviewed: 10/14/2015  [employer]vier Interactive Patient Education  2018 [employer]

## 2018-02-02 ENCOUNTER — Other Ambulatory Visit: Payer: Self-pay | Admitting: *Deleted

## 2018-02-02 MED ORDER — IRBESARTAN 75 MG PO TABS: 75.0000 mg | ORAL_TABLET | Freq: Every day | ORAL | 5 refills | Status: DC

## 2018-02-03 LAB — PAIN MGMT, PROFILE 8 W/CONF, U
6 ACETYLMORPHINE: NEGATIVE ng/mL (ref <10)
ALPHAHYDROXYTRIAZOLAM: NEGATIVE ng/mL (ref <50)
Alcohol Metabolites: POSITIVE ng/mL — AB (ref <500)
Alphahydroxyalprazolam: NEGATIVE ng/mL (ref <25)
Alphahydroxymidazolam: NEGATIVE ng/mL (ref <50)
Aminoclonazepam: 406 ng/mL — ABNORMAL HIGH (ref <25)
Amphetamines: NEGATIVE ng/mL (ref <500)
BENZODIAZEPINES: POSITIVE ng/mL — AB (ref <100)
Buprenorphine, Urine: NEGATIVE ng/mL (ref <5)
Cocaine Metabolite: NEGATIVE ng/mL (ref <150)
Creatinine: 180.4 mg/dL
Ethyl Glucuronide (ETG): >500000 ng/mL — ABNORMAL HIGH (ref <500)
Ethyl Sulfate (ETS): >40000 ng/mL — ABNORMAL HIGH (ref <100)
HYDROXYETHYLFLURAZEPAM: NEGATIVE ng/mL (ref <50)
Lorazepam: NEGATIVE ng/mL (ref <50)
MARIJUANA METABOLITE: NEGATIVE ng/mL (ref <20)
MDMA: NEGATIVE ng/mL (ref <500)
Nordiazepam: NEGATIVE ng/mL (ref <50)
OXAZEPAM: NEGATIVE ng/mL (ref <50)
OXYCODONE: NEGATIVE ng/mL (ref <100)
Opiates: NEGATIVE ng/mL (ref <100)
Oxidant: NEGATIVE ug/mL (ref <200)
Temazepam: NEGATIVE ng/mL (ref <50)
pH: 5.65 (ref 4.5–9.0)

## 2018-02-05 ENCOUNTER — Other Ambulatory Visit: Payer: Self-pay | Admitting: Family Medicine

## 2018-02-05 NOTE — Telephone Encounter (Signed)
SW Dr. Milinda CaveMcGowen and was given okay to send new Rx for lower dose. New sig: take 1 tab (100mg ) qhs. Rx sent as directed by Dr. Milinda CaveMcGowen.

## 2018-02-16 ENCOUNTER — Other Ambulatory Visit: Payer: Self-pay | Admitting: Family Medicine

## 2018-04-07 ENCOUNTER — Other Ambulatory Visit: Payer: Self-pay

## 2018-04-07 MED ORDER — CLONAZEPAM 1 MG PO TABS: ORAL_TABLET | ORAL | 5 refills | Status: DC

## 2018-04-29 ENCOUNTER — Other Ambulatory Visit: Payer: Self-pay | Admitting: Family Medicine

## 2018-05-01 ENCOUNTER — Other Ambulatory Visit: Payer: Self-pay | Admitting: Family Medicine

## 2018-05-01 NOTE — Telephone Encounter (Signed)
Irbesartan is on backorder, pharmacy requesting to change to losartan.  Please advise. Thanks.

## 2018-05-04 NOTE — Telephone Encounter (Signed)
OK, I'll do this change.

## 2018-05-04 NOTE — Telephone Encounter (Signed)
Pt advised and voiced understanding.   

## 2018-06-25 DIAGNOSIS — Z1231 Encounter for screening mammogram for malignant neoplasm of breast: Secondary | ICD-10-CM | POA: Diagnosis not present

## 2018-06-25 DIAGNOSIS — Z6828 Body mass index (BMI) 28.0-28.9, adult: Secondary | ICD-10-CM | POA: Diagnosis not present

## 2018-06-25 DIAGNOSIS — Z01419 Encounter for gynecological examination (general) (routine) without abnormal findings: Secondary | ICD-10-CM | POA: Diagnosis not present

## 2018-06-27 ENCOUNTER — Other Ambulatory Visit: Payer: Self-pay | Admitting: Family Medicine

## 2018-06-29 ENCOUNTER — Other Ambulatory Visit: Payer: Self-pay | Admitting: Obstetrics and Gynecology

## 2018-06-29 DIAGNOSIS — R928 Other abnormal and inconclusive findings on diagnostic imaging of breast: Secondary | ICD-10-CM

## 2018-07-03 ENCOUNTER — Other Ambulatory Visit: Payer: Self-pay | Admitting: Obstetrics and Gynecology

## 2018-07-03 ENCOUNTER — Ambulatory Visit
Admission: RE | Admit: 2018-07-03 | Discharge: 2018-07-03 | Disposition: A | Discharge status: Home / Self Care | Payer: BLUE CROSS/BLUE SHIELD | Source: Ambulatory Visit | Attending: Obstetrics and Gynecology | Admitting: Obstetrics and Gynecology

## 2018-07-03 DIAGNOSIS — R922 Inconclusive mammogram: Secondary | ICD-10-CM | POA: Diagnosis not present

## 2018-07-03 DIAGNOSIS — N6310 Unspecified lump in the right breast, unspecified quadrant: Secondary | ICD-10-CM

## 2018-07-03 DIAGNOSIS — R928 Other abnormal and inconclusive findings on diagnostic imaging of breast: Secondary | ICD-10-CM

## 2018-07-03 DIAGNOSIS — N6313 Unspecified lump in the right breast, lower outer quadrant: Secondary | ICD-10-CM | POA: Diagnosis not present

## 2018-07-10 ENCOUNTER — Ambulatory Visit
Admission: RE | Admit: 2018-07-10 | Discharge: 2018-07-10 | Disposition: A | Discharge status: Home / Self Care | Payer: BLUE CROSS/BLUE SHIELD | Source: Ambulatory Visit | Attending: Obstetrics and Gynecology | Admitting: Obstetrics and Gynecology

## 2018-07-10 DIAGNOSIS — N6310 Unspecified lump in the right breast, unspecified quadrant: Secondary | ICD-10-CM

## 2018-07-10 DIAGNOSIS — N6313 Unspecified lump in the right breast, lower outer quadrant: Secondary | ICD-10-CM | POA: Diagnosis not present

## 2018-07-10 DIAGNOSIS — D241 Benign neoplasm of right breast: Secondary | ICD-10-CM | POA: Diagnosis not present

## 2018-07-14 HISTORY — PX: BREAST BIOPSY: SHX20

## 2018-07-16 ENCOUNTER — Encounter: Payer: BLUE CROSS/BLUE SHIELD | Admitting: Family Medicine

## 2018-08-31 ENCOUNTER — Other Ambulatory Visit: Payer: Self-pay | Admitting: Family Medicine

## 2018-09-02 ENCOUNTER — Encounter: Payer: Self-pay | Admitting: *Deleted

## 2018-09-04 NOTE — Telephone Encounter (Signed)
Tried calling pt, NA and unable to leave a message due to vm box being full or not set up yet.  

## 2018-09-07 NOTE — Telephone Encounter (Signed)
Tried calling pt, NA and unable to leave a message due to vm box being full or not set up yet.  

## 2018-09-14 ENCOUNTER — Encounter: Payer: Self-pay | Admitting: *Deleted

## 2018-09-14 NOTE — Telephone Encounter (Signed)
Pt has not returned my calls or checked her mychart. Letter mailed to address in EMR.

## 2018-10-27 ENCOUNTER — Other Ambulatory Visit: Payer: Self-pay | Admitting: Family Medicine

## 2018-11-03 ENCOUNTER — Ambulatory Visit: Payer: BLUE CROSS/BLUE SHIELD | Admitting: Family Medicine

## 2018-11-04 ENCOUNTER — Ambulatory Visit: Payer: BLUE CROSS/BLUE SHIELD | Admitting: Family Medicine

## 2018-11-17 ENCOUNTER — Ambulatory Visit: Payer: BLUE CROSS/BLUE SHIELD | Admitting: Family Medicine

## 2018-11-17 ENCOUNTER — Encounter: Payer: Self-pay | Admitting: Family Medicine

## 2018-11-17 VITALS — BP 107/71 | HR 92 | Temp 98.1°F | Resp 16 | Ht 65.0 in | Wt 168.1 lb

## 2018-11-17 DIAGNOSIS — R740 Nonspecific elevation of levels of transaminase and lactic acid dehydrogenase [LDH]: Secondary | ICD-10-CM | POA: Diagnosis not present

## 2018-11-17 DIAGNOSIS — F330 Major depressive disorder, recurrent, mild: Secondary | ICD-10-CM | POA: Diagnosis not present

## 2018-11-17 DIAGNOSIS — I10 Essential (primary) hypertension: Secondary | ICD-10-CM | POA: Diagnosis not present

## 2018-11-17 DIAGNOSIS — F411 Generalized anxiety disorder: Secondary | ICD-10-CM

## 2018-11-17 DIAGNOSIS — E663 Overweight: Secondary | ICD-10-CM | POA: Diagnosis not present

## 2018-11-17 DIAGNOSIS — R7401 Elevation of levels of liver transaminase levels: Secondary | ICD-10-CM

## 2018-11-17 MED ORDER — CARVEDILOL 25 MG PO TABS: ORAL_TABLET | ORAL | 1 refills | Status: DC

## 2018-11-17 MED ORDER — SERTRALINE HCL 100 MG PO TABS: ORAL_TABLET | ORAL | 1 refills | Status: DC

## 2018-11-17 MED ORDER — HYDROCHLOROTHIAZIDE 25 MG PO TABS: 25.0000 mg | ORAL_TABLET | Freq: Every day | ORAL | 1 refills | Status: DC

## 2018-11-17 MED ORDER — LOSARTAN POTASSIUM 50 MG PO TABS: ORAL_TABLET | ORAL | 1 refills | Status: DC

## 2018-11-17 MED ORDER — AMLODIPINE BESYLATE 10 MG PO TABS: 10.0000 mg | ORAL_TABLET | Freq: Every day | ORAL | 1 refills | Status: DC

## 2018-11-17 NOTE — Patient Instructions (Signed)
Go ahead and stop taking your aspirin.

## 2018-11-17 NOTE — Progress Notes (Signed)
OFFICE VISIT  11/17/2018   CC:  Chief Complaint  Patient presents with  . Follow-up    RCI, pt is not fasting.    HPI:    Patient is a 43 y.o. Caucasian female who presents for f/u HTN, MDD with GAD, alcohol abuse, hx of mild elevation of LFTs (presumably due to alcohol abuse). I last saw her about 8 months ago. No changes in bp regimen at that time. She was still struggling a lot with anx and dep, but continued to insist on relying mostly on alcohol use (and smoking cigs) as her coping mechanism. I made no changes to her anx/dep med regimen at that time, and UDS showed appropriate rx med results and confirmed high alcohol levels.  Interim Hx: Has been busy, daughter doing ok/stable.  Recent death of chronically ill mother in law caused pt to need to travel to [place] a lot. No home bp monitoring but is compliant with meds. Still struggling with dep/anx and using alcohol "too much" every evening--she is too embarrassed to even tell me how much, says she doesn't drive impaired.  Still takes care of daughter and takes care of responsibilities in daytime.  She stopped taking clonazepam shortly after last visit after being warned by me that this could be very dangerous with alcohol over-ingestion. She continues to feel anhedonia, depression, hopelessness, poor energy levels several days per week. No SI or HI.   She admits that she will never get much better from mood/anxiety standpoint w/out quitting alcohol.  ROS: no CP, no SOB, no wheezing, no cough, no dizziness, no HAs, no rashes, no melena/hematochezia.  No polyuria or polydipsia.  No myalgias or arthralgias.  No LE swelling.   Past Medical History:  Diagnosis Date  . Anxiety   . Elevated transaminase level    likely secondary to alcohol.  Viral Hep screens normal.  . Hypertension   . Left ankle sprain 09/13/2017   [facility] Priority care: x-ray NORMAL 09/13/17.  . Tobacco dependence     Past Surgical History:  Procedure  Laterality Date  . CATARACT EXTRACTION W/ INTRAOCULAR LENS  IMPLANT, BILATERAL  2008 and 2010  . CERVICAL CONE BIOPSY     for HPV  . WISDOM TOOTH EXTRACTION      Outpatient Medications Prior to Visit  Medication Sig Dispense Refill  . amLODipine (NORVASC) 10 MG tablet TAKE 1 TABLET BY MOUTH EVERY DAY 90 tablet 1  . aspirin 81 MG tablet Take 81 mg by mouth daily.    . carvedilol (COREG) 25 MG tablet TAKE 1 TABLET BY MOUTH 2 TIMES DAILY WITH A MEAL. 60 tablet 0  . hydrochlorothiazide (HYDRODIURIL) 25 MG tablet Take 1 tablet (25 mg total) by mouth daily. OFFICE VISIT NEEDED 90 tablet 0  . losartan (COZAAR) 50 MG tablet 1 tab po qd 30 tablet 6  . norethindrone (CAMILA) 0.35 MG tablet Take 1 tablet by mouth daily.    . sertraline (ZOLOFT) 100 MG tablet TAKE 1 TABLET BY MOUTH AT BEDTIME 30 tablet 0  . clonazePAM (KLONOPIN) 1 MG tablet TAKE 1 TABLET BY MOUTH TWICE A DAY AS NEEDED FOR ANXIETY (Patient not taking: Reported on 11/17/2018) 60 tablet 5  . mometasone (ELOCON) 0.1 % ointment Apply bid (Patient not taking: Reported on 01/28/2018) 45 g 0   No facility-administered medications prior to visit.     Allergies  Allergen Reactions  . Lisinopril     Angioedema  . Sulfa Antibiotics      ?  reaction    ROS As per HPI  PE: Blood pressure 107/71, pulse 92, temperature 98.1 F (36.7 C), temperature source Oral, resp. rate 16, height 5\' 5"  (1.651 m), weight 168 lb 2 oz (76.3 kg), last menstrual period 11/03/2018, SpO2 98 %. Gen: Alert, well appearing.  Patient is oriented to person, place, time, and situation. AFFECT: pleasant, lucid thought and speech. CV: RRR, no m/r/g.   LUNGS: CTA bilat, nonlabored resps, good aeration in all lung fields. EXT: no clubbing or cyanosis.  no edema.  SKin; no jaundice or pallor.   LABS:  Lab Results  Component Value Date   TSH 1.27 01/13/2017   Lab Results  Component Value Date   WBC 5.3 01/13/2017   HGB 13.6 01/13/2017   HCT 40.0 01/13/2017    MCV 100.0 01/13/2017   PLT 245.0 01/13/2017   Lab Results  Component Value Date   CREATININE 0.59 01/28/2018   BUN 9 01/28/2018   NA 137 01/28/2018   K 3.9 01/28/2018   CL 99 01/28/2018   CO2 28 01/28/2018   Lab Results  Component Value Date   ALT 45 (H) 01/28/2018   AST 60 (H) 01/28/2018   ALKPHOS 59 01/28/2018   BILITOT 0.4 01/28/2018   Lab Results  Component Value Date   CHOL 155 01/13/2017   Lab Results  Component Value Date   HDL 72.20 01/13/2017   Lab Results  Component Value Date   LDLCALC 75 01/13/2017   Lab Results  Component Value Date   TRIG 40.0 01/13/2017   Lab Results  Component Value Date   CHOLHDL 2 01/13/2017   Lab Results  Component Value Date   HGBA1C 5.4 10/10/2014    IMPRESSION AND PLAN:  1) MDD and GAD: continue sertraline 100mg  qd, RF'd today. Avoid benzo's.  2) HTN: no home monitoring but she is compliant with medication and her bp is normal here today. Continue current meds. Will check lytes/cr at upcoming CPE scheduled 11/26/2018.  3) Alcohol abuse/alcoholism, with hx of mildly elevated LFTs. Encouraged pt to stop drinking.  She is not contemplating this right now. Will recheck hepatic panel at f/u on 11/26/18. Will discuss possible abd u/s to look at liver as well.  An After Visit Summary was printed and given to the patient.  FOLLOW UP:  Keep appt for CPE scheduled for 11/26/2018.  Signed:  Santiago Bumpers, MD            11/17/2018

## 2018-11-26 ENCOUNTER — Ambulatory Visit (INDEPENDENT_AMBULATORY_CARE_PROVIDER_SITE_OTHER): Payer: BLUE CROSS/BLUE SHIELD | Admitting: Family Medicine

## 2018-11-26 ENCOUNTER — Encounter: Payer: Self-pay | Admitting: Family Medicine

## 2018-11-26 VITALS — BP 103/68 | HR 82 | Temp 98.9°F | Resp 16 | Ht 65.0 in | Wt 167.0 lb

## 2018-11-26 DIAGNOSIS — E663 Overweight: Secondary | ICD-10-CM

## 2018-11-26 DIAGNOSIS — I10 Essential (primary) hypertension: Secondary | ICD-10-CM

## 2018-11-26 DIAGNOSIS — Z0000 Encounter for general adult medical examination without abnormal findings: Secondary | ICD-10-CM | POA: Diagnosis not present

## 2018-11-26 DIAGNOSIS — R7401 Elevation of levels of liver transaminase levels: Secondary | ICD-10-CM

## 2018-11-26 DIAGNOSIS — R740 Nonspecific elevation of levels of transaminase and lactic acid dehydrogenase [LDH]: Secondary | ICD-10-CM | POA: Diagnosis not present

## 2018-11-26 DIAGNOSIS — Z9849 Cataract extraction status, unspecified eye: Secondary | ICD-10-CM

## 2018-11-26 DIAGNOSIS — H5462 Unqualified visual loss, left eye, normal vision right eye: Secondary | ICD-10-CM

## 2018-11-26 LAB — LIPID PANEL
CHOL/HDL RATIO: 2
CHOLESTEROL: 166 mg/dL (ref 0–200)
HDL: 78.9 mg/dL (ref >39.00)
LDL Cholesterol: 77 mg/dL (ref 0–99)
NonHDL: 87.45
TRIGLYCERIDES: 54 mg/dL (ref 0.0–149.0)
VLDL: 10.8 mg/dL (ref 0.0–40.0)

## 2018-11-26 LAB — CBC WITH DIFFERENTIAL/PLATELET
Basophils Absolute: 0 10*3/uL (ref 0.0–0.1)
Basophils Relative: 0.5 % (ref 0.0–3.0)
EOS ABS: 0.2 10*3/uL (ref 0.0–0.7)
EOS PCT: 2.4 % (ref 0.0–5.0)
HCT: 42.3 % (ref 36.0–46.0)
HEMOGLOBIN: 14.5 g/dL (ref 12.0–15.0)
Lymphocytes Relative: 23.1 % (ref 12.0–46.0)
Lymphs Abs: 1.9 10*3/uL (ref 0.7–4.0)
MCHC: 34.4 g/dL (ref 30.0–36.0)
MCV: 101.5 fl — ABNORMAL HIGH (ref 78.0–100.0)
MONO ABS: 0.5 10*3/uL (ref 0.1–1.0)
Monocytes Relative: 6.6 % (ref 3.0–12.0)
Neutro Abs: 5.5 10*3/uL (ref 1.4–7.7)
Neutrophils Relative %: 67.4 % (ref 43.0–77.0)
Platelets: 262 10*3/uL (ref 150.0–400.0)
RBC: 4.16 Mil/uL (ref 3.87–5.11)
RDW: 13.1 % (ref 11.5–15.5)
WBC: 8.1 10*3/uL (ref 4.0–10.5)

## 2018-11-26 LAB — COMPREHENSIVE METABOLIC PANEL
ALBUMIN: 4.5 g/dL (ref 3.5–5.2)
ALT: 44 U/L — ABNORMAL HIGH (ref 0–35)
AST: 57 U/L — ABNORMAL HIGH (ref 0–37)
Alkaline Phosphatase: 55 U/L (ref 39–117)
BUN: 8 mg/dL (ref 6–23)
CALCIUM: 9.1 mg/dL (ref 8.4–10.5)
CHLORIDE: 100 meq/L (ref 96–112)
CO2: 22 meq/L (ref 19–32)
Creatinine, Ser: 0.52 mg/dL (ref 0.40–1.20)
GFR: 128.87 mL/min (ref >60.00)
Glucose, Bld: 83 mg/dL (ref 70–99)
Potassium: 3.9 mEq/L (ref 3.5–5.1)
Sodium: 135 mEq/L (ref 135–145)
Total Bilirubin: 0.5 mg/dL (ref 0.2–1.2)
Total Protein: 7.5 g/dL (ref 6.0–8.3)

## 2018-11-26 LAB — TSH: TSH: 1.8 u[IU]/mL (ref 0.35–4.50)

## 2018-11-26 MED ORDER — CLOTRIMAZOLE-BETAMETHASONE 1-0.05 % EX CREA: 1.0000 "application " | TOPICAL_CREAM | Freq: Two times a day (BID) | CUTANEOUS | 2 refills | Status: DC

## 2018-11-26 NOTE — Patient Instructions (Signed)

## 2018-11-26 NOTE — Progress Notes (Signed)
Office Note 11/26/2018  CC:  Chief Complaint  Patient presents with  . Annual Exam    Pt is fasting.     HPI:  Michelle Myers is a 43 y.o. White female who is here for annual health maintenance exam. GYN MD is Dr. Vincente PoliGrewal.  Has occ BRBPR that usually is not more than 1-2 in a day then resolves. Yesterday had the same. No anal/rectal pain.  Still smoking and drinking.  She's not ready to quit either habit.  Asks for referral to ophthalmology: has chronic decreased vision/blurry in L eye, hx of cataract surgery but pt doesn't want to return to see her previous ophthalmologist.  Past Medical History:  Diagnosis Date  . Anxiety   . Elevated transaminase level    likely secondary to alcohol.  Viral Hep screens normal.  . Hypertension   . Left ankle sprain 09/13/2017   [facility]: x-ray NORMAL 09/13/17.  . Tobacco dependence     Past Surgical History:  Procedure Laterality Date  . BREAST BIOPSY Right 07/2018   benign (fibroadenoma)  . CATARACT EXTRACTION W/ INTRAOCULAR LENS  IMPLANT, BILATERAL  2008 and 2010  . CERVICAL CONE BIOPSY     for HPV  . WISDOM TOOTH EXTRACTION      Family History  Problem Relation Age of Onset  . Prostate cancer Father   . Hypertension Father   . Brain cancer Daughter        neurofibromatosis, optic neurogliomas  . Diabetes Maternal Uncle   . Heart disease Maternal Grandmother        CBAG in late 7370s  . Hypertension Maternal Grandmother   . Heart attack Maternal Grandfather 60  . Arthritis Mother   . Hypertension Mother   . Alcohol abuse Brother        half brother, maternal  . Mental illness Paternal Grandmother   . Heart disease Paternal Grandfather   . Heart attack Brother [id number]       half brother, maternal  . Stroke Neg Hx   . Breast cancer Neg Hx     Social History   Socioeconomic History  . Marital status: Married    Spouse name: Not on file  . Number of children: Not on file  . Years of education: Not on  file  . Highest education level: Not on file  Occupational History  . Not on file  Social Needs  . Financial resource strain: Not on file  . Food insecurity:    Worry: Not on file    Inability: Not on file  . Transportation needs:    Medical: Not on file    Non-medical: Not on file  Tobacco Use  . Smoking status: Current Every Day Smoker    Packs/day: 1.00    Years: 24.00    Pack years: 24.00    Types: Cigarettes  . Smokeless tobacco: Never Used  . Tobacco comment: smoked 1992-present , up to 1.5 ppd  Substance and Sexual Activity  . Alcohol use: Yes    Alcohol/week: 0.0 standard drinks    Comment: 1.5 bottle of wine / night  . Drug use: No  . Sexual activity: Yes    Birth control/protection: Pill  Lifestyle  . Physical activity:    Days per week: Not on file    Minutes per session: Not on file  . Stress: Not on file  Relationships  . Social connections:    Talks on phone: Not on file    Gets  together: Not on file    Attends religious service: Not on file    Active member of club or organization: Not on file    Attends meetings of clubs or organizations: Not on file    Relationship status: Not on file  . Intimate partner violence:    Fear of current or ex partner: Not on file    Emotionally abused: Not on file    Physically abused: Not on file    Forced sexual activity: Not on file  Other Topics Concern  . Not on file  Social History Narrative   Married, 2 daughters.   Educ: Bachelor's degree   Occupation: Stay at home mom.   Youngest daughter has NF type 1 (age 50 as of 12/2015)   Tob: 24 pack-yr hx.   Alc: 1 and 1/2 bottles of wine per night.  +FH of alcoholism (2 brothers).    Outpatient Medications Prior to Visit  Medication Sig Dispense Refill  . amLODipine (NORVASC) 10 MG tablet Take 1 tablet (10 mg total) by mouth daily. 90 tablet 1  . carvedilol (COREG) 25 MG tablet TAKE 1 TABLET BY MOUTH 2 TIMES DAILY WITH A MEAL. 180 tablet 1  . hydrochlorothiazide  (HYDRODIURIL) 25 MG tablet Take 1 tablet (25 mg total) by mouth daily. 90 tablet 1  . losartan (COZAAR) 50 MG tablet 1 tab po qd 90 tablet 1  . norethindrone (CAMILA) 0.35 MG tablet Take 1 tablet by mouth daily.    . sertraline (ZOLOFT) 100 MG tablet TAKE 1 TABLET BY MOUTH AT BEDTIME 90 tablet 1   No facility-administered medications prior to visit.     Allergies  Allergen Reactions  . Lisinopril     Angioedema  . Sulfa Antibiotics      ? reaction    ROS Review of Systems  Constitutional: Negative for appetite change, chills, fatigue and fever.  HENT: Negative for congestion, dental problem, ear pain and sore throat.   Eyes: Negative for discharge, redness and visual disturbance.  Respiratory: Negative for cough, chest tightness, shortness of breath and wheezing.   Cardiovascular: Negative for chest pain, palpitations and leg swelling.  Gastrointestinal: Negative for abdominal pain, blood in stool, diarrhea, nausea and vomiting.  Genitourinary: Negative for difficulty urinating, dysuria, flank pain, frequency, hematuria and urgency.  Musculoskeletal: Negative for arthralgias, back pain, joint swelling, myalgias and neck stiffness.  Skin: Negative for pallor and rash.  Neurological: Negative for dizziness, speech difficulty, weakness and headaches.  Hematological: Negative for adenopathy. Does not bruise/bleed easily.  Psychiatric/Behavioral: Negative for confusion and sleep disturbance. The patient is not nervous/anxious.     PE; Blood pressure 103/68, pulse 82, temperature 98.9 F (37.2 C), temperature source Oral, resp. rate 16, height 5\' 5"  (1.651 m), weight 167 lb (75.8 kg), last menstrual period 11/18/2018, SpO2 100 %. Pt examined with Pryor Ochoa, CMA, as chaperone. Gen: Alert, well appearing.  Patient is oriented to person, place, time, and situation. AFFECT: pleasant, lucid thought and speech. ENT: Ears: EACs clear, normal epithelium.  TMs with good light reflex  and landmarks bilaterally.  Eyes: no injection, icteris, swelling, or exudate.  EOMI, PERRLA. Nose: no drainage or turbinate edema/swelling.  No injection or focal lesion.  Mouth: lips without lesion/swelling.  Oral mucosa pink and moist.  Dentition intact and without obvious caries or gingival swelling.  Oropharynx without erythema, exudate, or swelling.  Neck: supple/nontender.  No LAD, mass, or TM.  Carotid pulses 2+ bilaterally, without bruits. CV: RRR, no m/r/g.  LUNGS: CTA bilat, nonlabored resps, good aeration in all lung fields. ABD: soft, NT, ND, BS normal.  No hepatospenomegaly or mass.  No bruits. EXT: no clubbing, cyanosis, or edema.  Musculoskeletal: no joint swelling, erythema, warmth, or tenderness.  ROM of all joints intact. Skin - no sores or suspicious lesions or rashes or color changes   Pertinent labs:  Lab Results  Component Value Date   TSH 1.27 01/13/2017   Lab Results  Component Value Date   WBC 5.3 01/13/2017   HGB 13.6 01/13/2017   HCT 40.0 01/13/2017   MCV 100.0 01/13/2017   PLT 245.0 01/13/2017   Lab Results  Component Value Date   CREATININE 0.59 01/28/2018   BUN 9 01/28/2018   NA 137 01/28/2018   K 3.9 01/28/2018   CL 99 01/28/2018   CO2 28 01/28/2018   Lab Results  Component Value Date   ALT 45 (H) 01/28/2018   AST 60 (H) 01/28/2018   ALKPHOS 59 01/28/2018   BILITOT 0.4 01/28/2018   Lab Results  Component Value Date   CHOL 155 01/13/2017   Lab Results  Component Value Date   HDL 72.20 01/13/2017   Lab Results  Component Value Date   LDLCALC 75 01/13/2017   Lab Results  Component Value Date   TRIG 40.0 01/13/2017   Lab Results  Component Value Date   CHOLHDL 2 01/13/2017   Lab Results  Component Value Date   HGBA1C 5.4 10/10/2014    ASSESSMENT AND PLAN:   Health maintenance exam: Reviewed age and gender appropriate health maintenance issues (prudent diet, regular exercise, health risks of tobacco and excessive alcohol,  use of seatbelts, fire alarms in home, use of sunscreen).  Also reviewed age and gender appropriate health screening as well as vaccine recommendations. Vaccines: all UTD. Labs: fasting HP today. Cervical ca screening: UTD 2019-->will get GYN records. Breast ca screening: per GYN-->next mammo due 07/2019. Colon ca screening: average risk patient= as per latest guidelines, start screening at 6850 yrs of age.   Blurry vision OS, chronic.  +Hx of cataracts.  Refer to ophthalmology.  An After Visit Summary was printed and given to the patient.  FOLLOW UP:  6 mo RCI  Signed:  Santiago BumpersPhil Roxie Gueye, MD           11/26/2018

## 2018-11-27 ENCOUNTER — Encounter: Payer: Self-pay | Admitting: Family Medicine

## 2018-11-27 ENCOUNTER — Encounter: Payer: Self-pay | Admitting: *Deleted

## 2019-05-17 ENCOUNTER — Other Ambulatory Visit: Payer: Self-pay

## 2019-05-17 ENCOUNTER — Inpatient Hospital Stay (HOSPITAL_BASED_OUTPATIENT_CLINIC_OR_DEPARTMENT_OTHER)
Admission: EM | Admit: 2019-05-17 | Discharge: 2019-05-21 | DRG: 641 | Disposition: A | Discharge status: Rehabilitation Facility | Payer: BC Managed Care – PPO | Attending: Internal Medicine | Admitting: Internal Medicine

## 2019-05-17 ENCOUNTER — Ambulatory Visit (INDEPENDENT_AMBULATORY_CARE_PROVIDER_SITE_OTHER): Payer: BC Managed Care – PPO | Admitting: Family Medicine

## 2019-05-17 ENCOUNTER — Encounter: Payer: Self-pay | Admitting: Family Medicine

## 2019-05-17 ENCOUNTER — Encounter (HOSPITAL_BASED_OUTPATIENT_CLINIC_OR_DEPARTMENT_OTHER): Payer: Self-pay

## 2019-05-17 DIAGNOSIS — Z882 Allergy status to sulfonamides status: Secondary | ICD-10-CM | POA: Diagnosis not present

## 2019-05-17 DIAGNOSIS — E871 Hypo-osmolality and hyponatremia: Secondary | ICD-10-CM | POA: Diagnosis present

## 2019-05-17 DIAGNOSIS — F17200 Nicotine dependence, unspecified, uncomplicated: Secondary | ICD-10-CM | POA: Diagnosis present

## 2019-05-17 DIAGNOSIS — Z7141 Alcohol abuse counseling and surveillance of alcoholic: Secondary | ICD-10-CM | POA: Diagnosis not present

## 2019-05-17 DIAGNOSIS — Z20828 Contact with and (suspected) exposure to other viral communicable diseases: Secondary | ICD-10-CM | POA: Diagnosis not present

## 2019-05-17 DIAGNOSIS — J310 Chronic rhinitis: Secondary | ICD-10-CM | POA: Diagnosis not present

## 2019-05-17 DIAGNOSIS — T502X5A Adverse effect of carbonic-anhydrase inhibitors, benzothiadiazides and other diuretics, initial encounter: Secondary | ICD-10-CM | POA: Diagnosis present

## 2019-05-17 DIAGNOSIS — Z03818 Encounter for observation for suspected exposure to other biological agents ruled out: Secondary | ICD-10-CM | POA: Diagnosis not present

## 2019-05-17 DIAGNOSIS — R945 Abnormal results of liver function studies: Secondary | ICD-10-CM | POA: Diagnosis present

## 2019-05-17 DIAGNOSIS — F10239 Alcohol dependence with withdrawal, unspecified: Secondary | ICD-10-CM | POA: Diagnosis present

## 2019-05-17 DIAGNOSIS — K580 Irritable bowel syndrome with diarrhea: Secondary | ICD-10-CM | POA: Diagnosis present

## 2019-05-17 DIAGNOSIS — Z716 Tobacco abuse counseling: Secondary | ICD-10-CM

## 2019-05-17 DIAGNOSIS — F1010 Alcohol abuse, uncomplicated: Secondary | ICD-10-CM | POA: Diagnosis present

## 2019-05-17 DIAGNOSIS — K7010 Alcoholic hepatitis without ascites: Secondary | ICD-10-CM | POA: Diagnosis present

## 2019-05-17 DIAGNOSIS — Z79899 Other long term (current) drug therapy: Secondary | ICD-10-CM | POA: Diagnosis not present

## 2019-05-17 DIAGNOSIS — Z23 Encounter for immunization: Secondary | ICD-10-CM | POA: Diagnosis not present

## 2019-05-17 DIAGNOSIS — F329 Major depressive disorder, single episode, unspecified: Secondary | ICD-10-CM | POA: Diagnosis present

## 2019-05-17 DIAGNOSIS — F32A Depression, unspecified: Secondary | ICD-10-CM | POA: Diagnosis present

## 2019-05-17 DIAGNOSIS — R7989 Other specified abnormal findings of blood chemistry: Secondary | ICD-10-CM | POA: Diagnosis present

## 2019-05-17 DIAGNOSIS — F1020 Alcohol dependence, uncomplicated: Secondary | ICD-10-CM | POA: Diagnosis not present

## 2019-05-17 DIAGNOSIS — K2920 Alcoholic gastritis without bleeding: Secondary | ICD-10-CM | POA: Diagnosis not present

## 2019-05-17 DIAGNOSIS — F419 Anxiety disorder, unspecified: Secondary | ICD-10-CM | POA: Diagnosis present

## 2019-05-17 DIAGNOSIS — F17210 Nicotine dependence, cigarettes, uncomplicated: Secondary | ICD-10-CM | POA: Diagnosis present

## 2019-05-17 DIAGNOSIS — F339 Major depressive disorder, recurrent, unspecified: Secondary | ICD-10-CM | POA: Diagnosis not present

## 2019-05-17 DIAGNOSIS — R1013 Epigastric pain: Secondary | ICD-10-CM

## 2019-05-17 DIAGNOSIS — R55 Syncope and collapse: Secondary | ICD-10-CM | POA: Diagnosis present

## 2019-05-17 DIAGNOSIS — R109 Unspecified abdominal pain: Secondary | ICD-10-CM | POA: Diagnosis present

## 2019-05-17 DIAGNOSIS — Z8249 Family history of ischemic heart disease and other diseases of the circulatory system: Secondary | ICD-10-CM

## 2019-05-17 DIAGNOSIS — I10 Essential (primary) hypertension: Secondary | ICD-10-CM | POA: Diagnosis present

## 2019-05-17 DIAGNOSIS — F10230 Alcohol dependence with withdrawal, uncomplicated: Secondary | ICD-10-CM | POA: Diagnosis not present

## 2019-05-17 DIAGNOSIS — Z888 Allergy status to other drugs, medicaments and biological substances status: Secondary | ICD-10-CM

## 2019-05-17 DIAGNOSIS — R51 Headache: Secondary | ICD-10-CM | POA: Diagnosis not present

## 2019-05-17 LAB — URINALYSIS, ROUTINE W REFLEX MICROSCOPIC
Bilirubin Urine: NEGATIVE
Glucose, UA: NEGATIVE mg/dL
Hgb urine dipstick: NEGATIVE
Ketones, ur: 15 mg/dL — AB
Leukocytes,Ua: NEGATIVE
Nitrite: NEGATIVE
Protein, ur: NEGATIVE mg/dL
Specific Gravity, Urine: 1.01 (ref 1.005–1.030)
pH: 6 (ref 5.0–8.0)

## 2019-05-17 LAB — COMPREHENSIVE METABOLIC PANEL
ALT: 129 U/L — ABNORMAL HIGH (ref 0–44)
AST: 169 U/L — ABNORMAL HIGH (ref 15–41)
Albumin: 4.7 g/dL (ref 3.5–5.0)
Alkaline Phosphatase: 54 U/L (ref 38–126)
Anion gap: 18 — ABNORMAL HIGH (ref 5–15)
BUN: 8 mg/dL (ref 6–20)
CO2: 20 mmol/L — ABNORMAL LOW (ref 22–32)
Calcium: 9.3 mg/dL (ref 8.9–10.3)
Chloride: 81 mmol/L — ABNORMAL LOW (ref 98–111)
Creatinine, Ser: 0.47 mg/dL (ref 0.44–1.00)
GFR calc Af Amer: >60 mL/min (ref >60)
GFR calc non Af Amer: >60 mL/min (ref >60)
Glucose, Bld: 104 mg/dL — ABNORMAL HIGH (ref 70–99)
Potassium: 3.8 mmol/L (ref 3.5–5.1)
Sodium: 119 mmol/L — CL (ref 135–145)
Total Bilirubin: 1 mg/dL (ref 0.3–1.2)
Total Protein: 7.9 g/dL (ref 6.5–8.1)

## 2019-05-17 LAB — MAGNESIUM: Magnesium: 1.3 mg/dL — ABNORMAL LOW (ref 1.7–2.4)

## 2019-05-17 LAB — CBC WITH DIFFERENTIAL/PLATELET
Abs Immature Granulocytes: 0.03 10*3/uL (ref 0.00–0.07)
Basophils Absolute: 0 10*3/uL (ref 0.0–0.1)
Basophils Relative: 0 %
Eosinophils Absolute: 0.1 10*3/uL (ref 0.0–0.5)
Eosinophils Relative: 1 %
HCT: 35.7 % — ABNORMAL LOW (ref 36.0–46.0)
Hemoglobin: 13 g/dL (ref 12.0–15.0)
Immature Granulocytes: 0 %
Lymphocytes Relative: 20 %
Lymphs Abs: 1.4 10*3/uL (ref 0.7–4.0)
MCH: 34.6 pg — ABNORMAL HIGH (ref 26.0–34.0)
MCHC: 36.4 g/dL — ABNORMAL HIGH (ref 30.0–36.0)
MCV: 94.9 fL (ref 80.0–100.0)
Monocytes Absolute: 0.6 10*3/uL (ref 0.1–1.0)
Monocytes Relative: 8 %
Neutro Abs: 5 10*3/uL (ref 1.7–7.7)
Neutrophils Relative %: 71 %
Platelets: 226 10*3/uL (ref 150–400)
RBC: 3.76 MIL/uL — ABNORMAL LOW (ref 3.87–5.11)
RDW: 11.4 % — ABNORMAL LOW (ref 11.5–15.5)
WBC: 7.1 10*3/uL (ref 4.0–10.5)
nRBC: 0 % (ref 0.0–0.2)

## 2019-05-17 LAB — PREGNANCY, URINE: Preg Test, Ur: NEGATIVE

## 2019-05-17 MED ORDER — LORAZEPAM 2 MG/ML IJ SOLN
1.0000 mg | Freq: Once | INTRAMUSCULAR | Status: AC
  Administered 2019-05-17: 23:00:00 1 mg via INTRAVENOUS
  Filled 2019-05-17: qty 1

## 2019-05-17 MED ORDER — MAGNESIUM SULFATE 2 GM/50ML IV SOLN
2.0000 g | Freq: Once | INTRAVENOUS | Status: AC
  Administered 2019-05-18: 2 g via INTRAVENOUS
  Filled 2019-05-17: qty 50

## 2019-05-17 MED ORDER — SODIUM CHLORIDE 0.9 % IV SOLN
Freq: Once | INTRAVENOUS | Status: AC
  Administered 2019-05-18: via INTRAVENOUS

## 2019-05-17 MED ORDER — THIAMINE HCL 100 MG/ML IJ SOLN
100.0000 mg | Freq: Once | INTRAMUSCULAR | Status: AC
  Administered 2019-05-17: 23:00:00 100 mg via INTRAVENOUS
  Filled 2019-05-17: qty 2

## 2019-05-17 NOTE — ED Provider Notes (Signed)
[facility] EMERGENCY DEPARTMENT Provider Note   CSN: [id number] Arrival date & time: 05/17/19  2011    History   Chief Complaint Chief Complaint  Patient presents with  . Alcohol Problem  . Abdominal Pain    HPI Michelle Myers is a 43 y.o. female.     The history is provided by the patient and medical records. No language interpreter was used.  Alcohol Problem Associated symptoms include abdominal pain.  Abdominal Pain  Michelle Myers is a 43 y.o. female who presents to the Emergency Department complaining of alcohol problem. He presents to the emergency department accompanied by her husband for evaluation of alcohol abuse. She states that she has been drinking since the age of 59 and over the last few months she has been drinking up to 2 1/2 bottles of wine daily. She is ready to get help for her alcohol use. She presents to the emergency department for detox. She states that her brother has a history of alcohol withdrawal seizures. She is not tried to stop drinking in the past. She denies any fevers. She did have nausea and vomiting two days ago, now resolved. She complained of intermittent central abdominal pain to triage nurse, which is now gone. She has experienced diarrhea. No dysuria. She has a history of hypertension. No additional medical problems. Past Medical History:  Diagnosis Date  . Anxiety   . Elevated transaminase level    likely secondary to alcohol.  Viral Hep screens normal.  . ETOH abuse   . Hypertension   . Left ankle sprain 09/13/2017   [facility] Priority care: x-ray NORMAL 09/13/17.  . Tobacco dependence     Patient Active Problem List   Diagnosis Date Noted  . Insomnia 01/22/2016  . Elevated transaminase level 01/02/2016  . UTI (urinary tract infection) 10/14/2015  . Chest pain 09/11/2015  . RHINITIS MEDICAMENTOSA 03/20/2010  . Anxiety state 03/08/2010  . TOBACCO USE 03/08/2010  . Essential hypertension 03/08/2010  . IBS 03/08/2010     Past Surgical History:  Procedure Laterality Date  . BREAST BIOPSY Right 07/2018   benign (fibroadenoma)  . CATARACT EXTRACTION W/ INTRAOCULAR LENS  IMPLANT, BILATERAL  2008 and 2010  . CERVICAL CONE BIOPSY     for HPV  . WISDOM TOOTH EXTRACTION       OB History   No obstetric history on file.      Home Medications    Prior to Admission medications   Medication Sig Start Date End Date Taking? Authorizing Provider  amLODipine (NORVASC) 10 MG tablet Take 1 tablet (10 mg total) by mouth daily. 11/17/18   McGowen, Adrian Blackwater, MD  carvedilol (COREG) 25 MG tablet TAKE 1 TABLET BY MOUTH 2 TIMES DAILY WITH A MEAL. 11/17/18   McGowen, Adrian Blackwater, MD  hydrochlorothiazide (HYDRODIURIL) 25 MG tablet Take 1 tablet (25 mg total) by mouth daily. 11/17/18   McGowen, Adrian Blackwater, MD  losartan (COZAAR) 50 MG tablet 1 tab po qd 11/17/18   McGowen, Adrian Blackwater, MD  norethindrone (CAMILA) 0.35 MG tablet Take 1 tablet by mouth daily.    [provider]  sertraline (ZOLOFT) 100 MG tablet TAKE 1 TABLET BY MOUTH AT BEDTIME 11/17/18   McGowen, Adrian Blackwater, MD    Family History Family History  Problem Relation Age of Onset  . Prostate cancer Father   . Hypertension Father   . Brain cancer Daughter        neurofibromatosis, optic neurogliomas  . Diabetes  Maternal Uncle   . Heart disease Maternal Grandmother        CBAG in late 3370s  . Hypertension Maternal Grandmother   . Heart attack Maternal Grandfather 60  . Arthritis Mother   . Hypertension Mother   . Alcohol abuse Brother        half brother, maternal  . Mental illness Paternal Grandmother   . Heart disease Paternal Grandfather   . Heart attack Brother [id number]       half brother, maternal  . Stroke Neg Hx   . Breast cancer Neg Hx     Social History Social History   Tobacco Use  . Smoking status: Current Every Day Smoker    Packs/day: 1.00    Years: 24.00    Pack years: 24.00    Types: Cigarettes  . Smokeless tobacco: Never Used  . Tobacco  comment: smoked 1992-present , up to 1.5 ppd  Substance Use Topics  . Alcohol use: Yes    Alcohol/week: 0.0 standard drinks    Comment: 1.5 bottle of wine / night  . Drug use: No     Allergies   Lisinopril, Epinephrine, and Sulfa antibiotics   Review of Systems Review of Systems  Gastrointestinal: Positive for abdominal pain.  All other systems reviewed and are negative.    Physical Exam Updated Vital Signs BP 138/90 (BP Location: Right Arm)   Pulse 85   Temp 99 F (37.2 C) (Oral)   Resp (!) 22   Ht 5\' 5"  (1.651 m)   Wt 72.9 kg   SpO2 100%   BMI 26.76 kg/m   Physical Exam Vitals signs and nursing note reviewed.  Constitutional:      Appearance: She is well-developed.  HENT:     Head: Normocephalic and atraumatic.  Cardiovascular:     Rate and Rhythm: Normal rate and regular rhythm.     Heart sounds: No murmur.  Pulmonary:     Effort: Pulmonary effort is normal. No respiratory distress.     Breath sounds: Normal breath sounds.  Abdominal:     Palpations: Abdomen is soft.     Tenderness: There is no abdominal tenderness. There is no guarding or rebound.  Musculoskeletal:        General: No tenderness.  Skin:    General: Skin is warm and dry.  Neurological:     Mental Status: She is alert and oriented to person, place, and time.  Psychiatric:        Behavior: Behavior normal.     Comments: Anxious appearing      ED Treatments / Results  Labs (all labs ordered are listed, but only abnormal results are displayed) Labs Reviewed  URINALYSIS, ROUTINE W REFLEX MICROSCOPIC - Abnormal; Notable for the following components:      Result Value   Ketones, ur 15 (*)    All other components within normal limits  COMPREHENSIVE METABOLIC PANEL - Abnormal; Notable for the following components:   Sodium 119 (*)    Chloride 81 (*)    CO2 20 (*)    Glucose, Bld 104 (*)    AST 169 (*)    ALT 129 (*)    Anion gap 18 (*)    All other components within normal limits   CBC WITH DIFFERENTIAL/PLATELET - Abnormal; Notable for the following components:   RBC 3.76 (*)    HCT 35.7 (*)    MCH 34.6 (*)    MCHC 36.4 (*)    RDW 11.4 (*)  All other components within normal limits  MAGNESIUM - Abnormal; Notable for the following components:   Magnesium 1.3 (*)    All other components within normal limits  SARS CORONAVIRUS 2 (HOSPITAL ORDER, PERFORMED IN [facility])  PREGNANCY, URINE    EKG None  Radiology No results found.  Procedures Procedures (including critical care time) CRITICAL CARE Performed by: Tilden FossaElizabeth Kwanza Cancelliere   Total critical care time: 35 minutes  Critical care time was exclusive of separately billable procedures and treating other patients.  Critical care was necessary to treat or prevent imminent or life-threatening deterioration.  Critical care was time spent personally by me on the following activities: development of treatment plan with patient and/or surrogate as well as nursing, discussions with consultants, evaluation of patient's response to treatment, examination of patient, obtaining history from patient or surrogate, ordering and performing treatments and interventions, ordering and review of laboratory studies, ordering and review of radiographic studies, pulse oximetry and re-evaluation of patient's condition.  Medications Ordered in ED Medications  magnesium sulfate IVPB 2 g 50 mL (2 g Intravenous New Bag/Given 05/18/19 0014)  thiamine (B-1) injection 100 mg (100 mg Intravenous Given 05/17/19 2318)  LORazepam (ATIVAN) injection 1 mg (1 mg Intravenous Given 05/17/19 2319)  0.9 %  sodium chloride infusion ( Intravenous New Bag/Given 05/18/19 0011)     Initial Impression / Assessment and Plan / ED Course  I have reviewed the triage vital signs and the nursing notes.  Pertinent labs & imaging results that were available during my care of the patient were reviewed by me and considered in my medical decision making (see  chart for details).        Patient with history of alcohol abuse here seeking treatment for alcohol detox. She is anxious appearing on evaluation. Labs are significant for hyponatremia, hypomagnesemia. She was treated with IV magnesium, thiamine, normal saline. She was treated with Ativan for alcohol withdrawal. Discussed with patient findings of studies recommendation for admission and she is in agreement with treatment plan. Hospitalist consulted for admission.  Final Clinical Impressions(s) / ED Diagnoses   Final diagnoses:  Hyponatremia  Hypomagnesemia    ED Discharge Orders    None       Tilden Fossaees, Abbegayle Denault, MD 05/18/19 [id number]

## 2019-05-17 NOTE — ED Triage Notes (Addendum)
Pt states she is seeking treatment for ETOH abuse-last ETOH last night-pt also c/o abd pain x 4 weeks-pain worse after eating -NAD-steady gait

## 2019-05-17 NOTE — Progress Notes (Signed)
Virtual Visit via Video Note  I connected with pt on 05/17/19 at  4:00 PM EDT by a video enabled telemedicine application and verified that I am speaking with the correct person using two identifiers.  Location patient: home Location provider:work or home office Persons participating in the virtual visit: patient, provider  I discussed the limitations of evaluation and management by telemedicine and the availability of in person appointments. The patient expressed understanding and agreed to proceed.  Telemedicine visit is a necessity given the COVID-19 restrictions in place at the current time.  HPI: 43 y/o wf being seen for about 5 wks of abd pain.  Hurts in midline/subxyphoid area, lots of burping.  Gradually worsening/progressing to the point of being very severe, particularly after eating.   Some mild nausea, a little vomiting 2 n/a.  No fevers.  She fainted 2 d/a when she got dizzy after standing up, was drinking lots of alcohol during the time that she fainted.  Husband said she completely passed out and fell back and hit head on the floor.  No palpitations or CP.  Unknown duration of LOC. No NSAIDs except aleve the last 2 d (one tab at each dose).   Worse after eating.  No black/tarry stools.  No jaundice.  No diarrhea or recent prob with constipation.   She is drinking more alcohol than her usual 2 bottles of wine per night lately--often 3 bottles in one night.  Denies any other type of alcohol, denies drug use. She has had some headaches at the site where she hit her head in the fall 2 nights ago.    She broke down during our visit today and admitted that she has reached a point where she wants help for her alcoholism.  She says she is prepared to go through with detox and inpatient alc rehab care.  She has been drinking 2 bottles of wine a night for the last 10 yrs, most recent alcohol was last night.  ROS: no CP, no SOB, no wheezing, no cough, no dizziness, no rashes, no  melena/hematochezia.  No polyuria or polydipsia.  No myalgias or arthralgias.  Past Medical History:  Diagnosis Date  . Anxiety   . Elevated transaminase level    likely secondary to alcohol.  Viral Hep screens normal.  . Hypertension   . Left ankle sprain 09/13/2017   [facility] Priority care: x-ray NORMAL 09/13/17.  . Tobacco dependence     Past Surgical History:  Procedure Laterality Date  . BREAST BIOPSY Right 07/2018   benign (fibroadenoma)  . CATARACT EXTRACTION W/ INTRAOCULAR LENS  IMPLANT, BILATERAL  2008 and 2010  . CERVICAL CONE BIOPSY     for HPV  . WISDOM TOOTH EXTRACTION      Family History  Problem Relation Age of Onset  . Prostate cancer Father   . Hypertension Father   . Brain cancer Daughter        neurofibromatosis, optic neurogliomas  . Diabetes Maternal Uncle   . Heart disease Maternal Grandmother        CBAG in late 7170s  . Hypertension Maternal Grandmother   . Heart attack Maternal Grandfather 60  . Arthritis Mother   . Hypertension Mother   . Alcohol abuse Brother        half brother, maternal  . Mental illness Paternal Grandmother   . Heart disease Paternal Grandfather   . Heart attack Brother [id number]       half brother, maternal  . Stroke Neg  Hx   . Breast cancer Neg Hx     SOCIAL HX:  Social History   Socioeconomic History  . Marital status: Married    Spouse name: Not on file  . Number of children: Not on file  . Years of education: Not on file  . Highest education level: Not on file  Occupational History  . Not on file  Social Needs  . Financial resource strain: Not on file  . Food insecurity    Worry: Not on file    Inability: Not on file  . Transportation needs    Medical: Not on file    Non-medical: Not on file  Tobacco Use  . Smoking status: Current Every Day Smoker    Packs/day: 1.00    Years: 24.00    Pack years: 24.00    Types: Cigarettes  . Smokeless tobacco: Never Used  . Tobacco comment: smoked 1992-present , up to  1.5 ppd  Substance and Sexual Activity  . Alcohol use: Yes    Alcohol/week: 0.0 standard drinks    Comment: 1.5 bottle of wine / night  . Drug use: No  . Sexual activity: Yes    Birth control/protection: Pill  Lifestyle  . Physical activity    Days per week: Not on file    Minutes per session: Not on file  . Stress: Not on file  Relationships  . Social Musicianconnections    Talks on phone: Not on file    Gets together: Not on file    Attends religious service: Not on file    Active member of club or organization: Not on file    Attends meetings of clubs or organizations: Not on file    Relationship status: Not on file  Other Topics Concern  . Not on file  Social History Narrative   Married, 2 daughters.   Educ: Bachelor's degree   Occupation: Stay at home mom.   Youngest daughter has NF type 1 (age 76 as of 12/2015)   Tob: 24 pack-yr hx.   Alc: 1 and 1/2 bottles of wine per night.  +FH of alcoholism (2 brothers).      Current Outpatient Medications:  .  amLODipine (NORVASC) 10 MG tablet, Take 1 tablet (10 mg total) by mouth daily., Disp: 90 tablet, Rfl: 1 .  carvedilol (COREG) 25 MG tablet, TAKE 1 TABLET BY MOUTH 2 TIMES DAILY WITH A MEAL., Disp: 180 tablet, Rfl: 1 .  hydrochlorothiazide (HYDRODIURIL) 25 MG tablet, Take 1 tablet (25 mg total) by mouth daily., Disp: 90 tablet, Rfl: 1 .  losartan (COZAAR) 50 MG tablet, 1 tab po qd, Disp: 90 tablet, Rfl: 1 .  norethindrone (CAMILA) 0.35 MG tablet, Take 1 tablet by mouth daily., Disp: , Rfl:  .  sertraline (ZOLOFT) 100 MG tablet, TAKE 1 TABLET BY MOUTH AT BEDTIME, Disp: 90 tablet, Rfl: 1  EXAM:  VITALS per patient if applicable: There were no vitals taken for this visit.   GENERAL: alert, oriented, appears well and in no acute distress No jaundice or pallor.  HEENT: atraumatic, conjunttiva clear, no obvious abnormalities on inspection of external nose and ears  NECK: normal movements of the head and neck  LUNGS: on inspection  no signs of respiratory distress, breathing rate appears normal, no obvious gross SOB, gasping or wheezing  CV: no obvious cyanosis  MS: moves all visible extremities without noticeable abnormality  PSYCH/NEURO: pleasant and cooperative, obviously very anxious and with depressed mood. Speech and thought processing grossly  intact.  Crying for most of the visit.  LABS: none today    Chemistry      Component Value Date/Time   NA 135 11/26/2018 1011   K 3.9 11/26/2018 1011   CL 100 11/26/2018 1011   CO2 22 11/26/2018 1011   BUN 8 11/26/2018 1011   CREATININE 0.52 11/26/2018 1011      Component Value Date/Time   CALCIUM 9.1 11/26/2018 1011   ALKPHOS 55 11/26/2018 1011   AST 57 (H) 11/26/2018 1011   ALT 44 (H) 11/26/2018 1011   BILITOT 0.5 11/26/2018 1011     Lab Results  Component Value Date   WBC 8.1 11/26/2018   HGB 14.5 11/26/2018   HCT 42.3 11/26/2018   MCV 101.5 (H) 11/26/2018   PLT 262.0 11/26/2018   .ASSESSMENT AND PLAN:  Discussed the following assessment and plan:  1) Epigastric pain: suspect alcoholic gastritis.  PUD and/or acute pancreatitis also need to be ruled out. Less likely cholecystitis or alcoholic hepatitis. I recommended she get further evaluation in the ED-->see #2 below. She stated she doesn't have child care arranged for her daughter for tonight so she plans on waiting until tomorrow to go to the ED.  I strongly recommended she go as soon as she can get arrangements.  In the meantime we discussed use of otc pepcid and prevacid until she goes for eval in the ED.   Needs to bring her husband or a friend for support while waiting in the ED waiting room to be seen.  2) Alcoholism: admits she needs help, is at the point of WANTING help. I advised her NOT to try to abruptly quit without being in hosp for obs and assistance with detox. Can get care management attention/social worker to help her with the next step of the process (inpatient rehab center).     3) Headaches: recent head injury when she had syncopal episode while intoxicated. ED eval recommended.  I discussed the assessment and treatment plan with the patient. The patient was provided an opportunity to ask questions and all were answered. The patient agreed with the plan and demonstrated an understanding of the instructions.   The patient was advised to call back or seek an in-person evaluation if the symptoms worsen or if the condition fails to improve as anticipated.  Spent 30 min with pt today, with >50% of this time spent in counseling and care coordination regarding the above problems.  F/u: To be determined based on results of evaluation in ED/hosp.  Signed:  Crissie Sickles, MD           05/17/2019

## 2019-05-18 ENCOUNTER — Inpatient Hospital Stay (HOSPITAL_COMMUNITY): Payer: BC Managed Care – PPO

## 2019-05-18 ENCOUNTER — Emergency Department (HOSPITAL_BASED_OUTPATIENT_CLINIC_OR_DEPARTMENT_OTHER): Payer: BC Managed Care – PPO

## 2019-05-18 ENCOUNTER — Encounter: Payer: Self-pay | Admitting: Family Medicine

## 2019-05-18 DIAGNOSIS — R55 Syncope and collapse: Secondary | ICD-10-CM

## 2019-05-18 DIAGNOSIS — F1010 Alcohol abuse, uncomplicated: Secondary | ICD-10-CM | POA: Diagnosis not present

## 2019-05-18 DIAGNOSIS — R109 Unspecified abdominal pain: Secondary | ICD-10-CM | POA: Diagnosis present

## 2019-05-18 DIAGNOSIS — R1013 Epigastric pain: Secondary | ICD-10-CM

## 2019-05-18 DIAGNOSIS — Z8249 Family history of ischemic heart disease and other diseases of the circulatory system: Secondary | ICD-10-CM | POA: Diagnosis not present

## 2019-05-18 DIAGNOSIS — F329 Major depressive disorder, single episode, unspecified: Secondary | ICD-10-CM | POA: Diagnosis present

## 2019-05-18 DIAGNOSIS — F17200 Nicotine dependence, unspecified, uncomplicated: Secondary | ICD-10-CM

## 2019-05-18 DIAGNOSIS — I10 Essential (primary) hypertension: Secondary | ICD-10-CM | POA: Diagnosis present

## 2019-05-18 DIAGNOSIS — J310 Chronic rhinitis: Secondary | ICD-10-CM | POA: Diagnosis present

## 2019-05-18 DIAGNOSIS — Z79899 Other long term (current) drug therapy: Secondary | ICD-10-CM | POA: Diagnosis not present

## 2019-05-18 DIAGNOSIS — R7989 Other specified abnormal findings of blood chemistry: Secondary | ICD-10-CM | POA: Diagnosis present

## 2019-05-18 DIAGNOSIS — R945 Abnormal results of liver function studies: Secondary | ICD-10-CM | POA: Diagnosis present

## 2019-05-18 DIAGNOSIS — Z7141 Alcohol abuse counseling and surveillance of alcoholic: Secondary | ICD-10-CM | POA: Diagnosis not present

## 2019-05-18 DIAGNOSIS — Z888 Allergy status to other drugs, medicaments and biological substances status: Secondary | ICD-10-CM | POA: Diagnosis not present

## 2019-05-18 DIAGNOSIS — F419 Anxiety disorder, unspecified: Secondary | ICD-10-CM | POA: Diagnosis present

## 2019-05-18 DIAGNOSIS — E871 Hypo-osmolality and hyponatremia: Secondary | ICD-10-CM | POA: Diagnosis present

## 2019-05-18 DIAGNOSIS — F32A Depression, unspecified: Secondary | ICD-10-CM | POA: Diagnosis present

## 2019-05-18 DIAGNOSIS — F339 Major depressive disorder, recurrent, unspecified: Secondary | ICD-10-CM | POA: Diagnosis present

## 2019-05-18 DIAGNOSIS — F10230 Alcohol dependence with withdrawal, uncomplicated: Secondary | ICD-10-CM | POA: Diagnosis not present

## 2019-05-18 DIAGNOSIS — Z716 Tobacco abuse counseling: Secondary | ICD-10-CM | POA: Diagnosis not present

## 2019-05-18 DIAGNOSIS — Z23 Encounter for immunization: Secondary | ICD-10-CM | POA: Diagnosis not present

## 2019-05-18 DIAGNOSIS — Z20828 Contact with and (suspected) exposure to other viral communicable diseases: Secondary | ICD-10-CM | POA: Diagnosis present

## 2019-05-18 DIAGNOSIS — K580 Irritable bowel syndrome with diarrhea: Secondary | ICD-10-CM | POA: Diagnosis present

## 2019-05-18 DIAGNOSIS — T502X5A Adverse effect of carbonic-anhydrase inhibitors, benzothiadiazides and other diuretics, initial encounter: Secondary | ICD-10-CM | POA: Diagnosis present

## 2019-05-18 DIAGNOSIS — Z882 Allergy status to sulfonamides status: Secondary | ICD-10-CM | POA: Diagnosis not present

## 2019-05-18 DIAGNOSIS — K2920 Alcoholic gastritis without bleeding: Secondary | ICD-10-CM | POA: Diagnosis present

## 2019-05-18 DIAGNOSIS — F10239 Alcohol dependence with withdrawal, unspecified: Secondary | ICD-10-CM | POA: Diagnosis present

## 2019-05-18 DIAGNOSIS — K7010 Alcoholic hepatitis without ascites: Secondary | ICD-10-CM | POA: Diagnosis present

## 2019-05-18 DIAGNOSIS — F17210 Nicotine dependence, cigarettes, uncomplicated: Secondary | ICD-10-CM | POA: Diagnosis present

## 2019-05-18 LAB — COMPREHENSIVE METABOLIC PANEL
ALT: 105 U/L — ABNORMAL HIGH (ref 0–44)
AST: 114 U/L — ABNORMAL HIGH (ref 15–41)
Albumin: 3.9 g/dL (ref 3.5–5.0)
Alkaline Phosphatase: 47 U/L (ref 38–126)
Anion gap: 13 (ref 5–15)
BUN: 6 mg/dL (ref 6–20)
CO2: 25 mmol/L (ref 22–32)
Calcium: 9.4 mg/dL (ref 8.9–10.3)
Chloride: 93 mmol/L — ABNORMAL LOW (ref 98–111)
Creatinine, Ser: 0.65 mg/dL (ref 0.44–1.00)
GFR calc Af Amer: >60 mL/min (ref >60)
GFR calc non Af Amer: >60 mL/min (ref >60)
Glucose, Bld: 97 mg/dL (ref 70–99)
Potassium: 3.6 mmol/L (ref 3.5–5.1)
Sodium: 131 mmol/L — ABNORMAL LOW (ref 135–145)
Total Bilirubin: 1.2 mg/dL (ref 0.3–1.2)
Total Protein: 6.7 g/dL (ref 6.5–8.1)

## 2019-05-18 LAB — ECHOCARDIOGRAM COMPLETE
Height: 65 in
Weight: 2512 oz

## 2019-05-18 LAB — CBC WITH DIFFERENTIAL/PLATELET
Abs Immature Granulocytes: 0.01 10*3/uL (ref 0.00–0.07)
Basophils Absolute: 0 10*3/uL (ref 0.0–0.1)
Basophils Relative: 0 %
Eosinophils Absolute: 0.1 10*3/uL (ref 0.0–0.5)
Eosinophils Relative: 1 %
HCT: 33.8 % — ABNORMAL LOW (ref 36.0–46.0)
Hemoglobin: 12.4 g/dL (ref 12.0–15.0)
Immature Granulocytes: 0 %
Lymphocytes Relative: 27 %
Lymphs Abs: 1.4 10*3/uL (ref 0.7–4.0)
MCH: 35 pg — ABNORMAL HIGH (ref 26.0–34.0)
MCHC: 36.7 g/dL — ABNORMAL HIGH (ref 30.0–36.0)
MCV: 95.5 fL (ref 80.0–100.0)
Monocytes Absolute: 0.7 10*3/uL (ref 0.1–1.0)
Monocytes Relative: 13 %
Neutro Abs: 3 10*3/uL (ref 1.7–7.7)
Neutrophils Relative %: 59 %
Platelets: 220 10*3/uL (ref 150–400)
RBC: 3.54 MIL/uL — ABNORMAL LOW (ref 3.87–5.11)
RDW: 11.5 % (ref 11.5–15.5)
WBC: 5.1 10*3/uL (ref 4.0–10.5)
nRBC: 0 % (ref 0.0–0.2)

## 2019-05-18 LAB — BASIC METABOLIC PANEL
Anion gap: 12 (ref 5–15)
Anion gap: 11 (ref 5–15)
BUN: 9 mg/dL (ref 6–20)
BUN: 7 mg/dL (ref 6–20)
CO2: 25 mmol/L (ref 22–32)
CO2: 25 mmol/L (ref 22–32)
Calcium: 9.3 mg/dL (ref 8.9–10.3)
Calcium: 9.1 mg/dL (ref 8.9–10.3)
Chloride: 93 mmol/L — ABNORMAL LOW (ref 98–111)
Chloride: 95 mmol/L — ABNORMAL LOW (ref 98–111)
Creatinine, Ser: 0.75 mg/dL (ref 0.44–1.00)
Creatinine, Ser: 0.71 mg/dL (ref 0.44–1.00)
GFR calc Af Amer: >60 mL/min (ref >60)
GFR calc Af Amer: >60 mL/min (ref >60)
GFR calc non Af Amer: >60 mL/min (ref >60)
GFR calc non Af Amer: >60 mL/min (ref >60)
Glucose, Bld: 146 mg/dL — ABNORMAL HIGH (ref 70–99)
Glucose, Bld: 108 mg/dL — ABNORMAL HIGH (ref 70–99)
Potassium: 3.9 mmol/L (ref 3.5–5.1)
Potassium: 4.6 mmol/L (ref 3.5–5.1)
Sodium: 130 mmol/L — ABNORMAL LOW (ref 135–145)
Sodium: 131 mmol/L — ABNORMAL LOW (ref 135–145)

## 2019-05-18 LAB — MAGNESIUM: Magnesium: 2.1 mg/dL (ref 1.7–2.4)

## 2019-05-18 LAB — OSMOLALITY: Osmolality: 273 mOsm/kg — ABNORMAL LOW (ref 275–295)

## 2019-05-18 LAB — LIPASE, BLOOD: Lipase: 29 U/L (ref 11–51)

## 2019-05-18 LAB — HIV ANTIBODY (ROUTINE TESTING W REFLEX): HIV Screen 4th Generation wRfx: NONREACTIVE

## 2019-05-18 LAB — SARS CORONAVIRUS 2 BY RT PCR (HOSPITAL ORDER, PERFORMED IN ~~LOC~~ HOSPITAL LAB): SARS Coronavirus 2: NEGATIVE

## 2019-05-18 LAB — TSH: TSH: 5.012 u[IU]/mL — ABNORMAL HIGH (ref 0.350–4.500)

## 2019-05-18 MED ORDER — LORAZEPAM 1 MG PO TABS
0.0000 mg | ORAL_TABLET | Freq: Four times a day (QID) | ORAL | Status: DC
  Administered 2019-05-18: 12:00:00 1 mg via ORAL
  Administered 2019-05-19 (×3): 2 mg via ORAL
  Filled 2019-05-18 (×2): qty 2
  Filled 2019-05-18 (×2): qty 1
  Filled 2019-05-18: qty 2

## 2019-05-18 MED ORDER — LORAZEPAM 1 MG PO TABS
0.0000 mg | ORAL_TABLET | Freq: Two times a day (BID) | ORAL | Status: DC
  Administered 2019-05-20: 01:00:00 2 mg via ORAL
  Filled 2019-05-18: qty 2

## 2019-05-18 MED ORDER — PERFLUTREN LIPID MICROSPHERE
1.0000 mL | INTRAVENOUS | Status: AC | PRN
  Administered 2019-05-18: 10:00:00 3 mL via INTRAVENOUS
  Filled 2019-05-18: qty 10

## 2019-05-18 MED ORDER — HYDRALAZINE HCL 20 MG/ML IJ SOLN: 5.0000 mg | INTRAMUSCULAR | Status: DC | PRN

## 2019-05-18 MED ORDER — LORAZEPAM 2 MG/ML IJ SOLN: 0.0000 mg | Freq: Two times a day (BID) | INTRAMUSCULAR | Status: DC

## 2019-05-18 MED ORDER — PANTOPRAZOLE SODIUM 40 MG PO TBEC
40.0000 mg | DELAYED_RELEASE_TABLET | Freq: Every day | ORAL | Status: DC
  Administered 2019-05-18 – 2019-05-19 (×2): 40 mg via ORAL
  Filled 2019-05-18 (×2): qty 1

## 2019-05-18 MED ORDER — LORAZEPAM 2 MG/ML IJ SOLN
0.0000 mg | Freq: Four times a day (QID) | INTRAMUSCULAR | Status: DC
  Administered 2019-05-18: 05:00:00 2 mg via INTRAVENOUS
  Filled 2019-05-18: qty 1

## 2019-05-18 MED ORDER — NICOTINE 21 MG/24HR TD PT24
21.0000 mg | MEDICATED_PATCH | Freq: Every day | TRANSDERMAL | Status: DC
  Administered 2019-05-18 – 2019-05-21 (×4): 21 mg via TRANSDERMAL
  Filled 2019-05-18 (×4): qty 1

## 2019-05-18 MED ORDER — PERFLUTREN LIPID MICROSPHERE
INTRAVENOUS | Status: AC
  Filled 2019-05-18: qty 10

## 2019-05-18 MED ORDER — ENOXAPARIN SODIUM 40 MG/0.4ML ~~LOC~~ SOLN
40.0000 mg | SUBCUTANEOUS | Status: DC
  Administered 2019-05-19 – 2019-05-21 (×3): 40 mg via SUBCUTANEOUS
  Filled 2019-05-18 (×3): qty 0.4

## 2019-05-18 MED ORDER — HYDROXYZINE HCL 10 MG PO TABS: 10.0000 mg | ORAL_TABLET | Freq: Three times a day (TID) | ORAL | Status: DC | PRN

## 2019-05-18 MED ORDER — CHLORDIAZEPOXIDE HCL 5 MG PO CAPS
10.0000 mg | ORAL_CAPSULE | Freq: Three times a day (TID) | ORAL | Status: DC
  Administered 2019-05-18 – 2019-05-19 (×5): 10 mg via ORAL
  Filled 2019-05-18 (×5): qty 2

## 2019-05-18 MED ORDER — PNEUMOCOCCAL VAC POLYVALENT 25 MCG/0.5ML IJ INJ: 0.5000 mL | INJECTION | INTRAMUSCULAR | Status: DC

## 2019-05-18 MED ORDER — SODIUM CHLORIDE 0.9 % IV SOLN: INTRAVENOUS | Status: DC

## 2019-05-18 MED ORDER — AMLODIPINE BESYLATE 10 MG PO TABS
10.0000 mg | ORAL_TABLET | Freq: Every day | ORAL | Status: DC
  Administered 2019-05-18 – 2019-05-19 (×2): 10 mg via ORAL
  Filled 2019-05-18 (×2): qty 1

## 2019-05-18 MED ORDER — NORETHINDRONE 0.35 MG PO TABS: 1.0000 | ORAL_TABLET | Freq: Every day | ORAL | Status: DC

## 2019-05-18 MED ORDER — LORAZEPAM 2 MG/ML IJ SOLN
1.0000 mg | Freq: Once | INTRAMUSCULAR | Status: AC
  Administered 2019-05-18: 20:00:00 1 mg via INTRAVENOUS
  Filled 2019-05-18: qty 1

## 2019-05-18 MED ORDER — CARVEDILOL 25 MG PO TABS
25.0000 mg | ORAL_TABLET | Freq: Two times a day (BID) | ORAL | Status: DC
  Administered 2019-05-18 – 2019-05-21 (×7): 25 mg via ORAL
  Filled 2019-05-18 (×7): qty 1

## 2019-05-18 NOTE — Progress Notes (Signed)
Patient ID: Michelle Myers, female   DOB: 1976-09-15, 43 y.o.   MRN: [id number] Patient admitted early this morning for abdominal pain and syncope.  Found to have sodium of 119.  Started on IV fluids.  Initial CT of the head was negative for acute intracranial abnormalities.  Patient seen at bedside.  Plan of care discussed with her.  I have reviewed her medical records including this morning's H&P, labs, vitals myself.  We will continue IV fluids and monitor BMP.  PT eval.  We will also get 2D echo and bilateral carotid ultrasound.  Repeat a.m. labs.

## 2019-05-18 NOTE — ED Notes (Signed)
Date and time results received: 05/17/19 2353 (use smartphrase ".now" to insert current time)  Test: sodium Critical Value: 119  Name of Provider Notified: Dr. Leonides Schanz  Orders Received? Or Actions Taken?: new orders received

## 2019-05-18 NOTE — ED Notes (Signed)
Patient provided food.

## 2019-05-18 NOTE — ED Notes (Signed)
Negative nausea or abdominal pain post meal.

## 2019-05-18 NOTE — Progress Notes (Signed)
  Echocardiogram 2D Echocardiogram with Definity has been performed.  Michelle Myers 05/18/2019, 10:32 AM

## 2019-05-18 NOTE — ED Notes (Signed)
[facility] notified (Taryn) - patient ready for transport 

## 2019-05-18 NOTE — H&P (Addendum)
History and Physical    Michelle Myers FAO:[id number] DOB: [birth year] DOA: 05/17/2019  Referring MD/NP/PA:   PCP: Jeoffrey MassedMcGowen, Philip H, MD   Patient coming from:  The patient is coming from home.  At baseline, pt is independent for most of ADL.        Chief Complaint: Abdominal pain, syncope  HPI: Michelle GellCara M Myers is a 43 y.o. female with medical history significant of alcohol abuse, tobacco abuse, IBS, hypertension, depression, who presents with abdominal pain, syncope.  Patient states that she has been having abdominal pain for more than 4 weeks.  It is located in the epigastric and middle abdoment, intermittent, 4 out of 10 in severity, nonradiating.  She has nausea and vomited twice on Saturday, currently no nausea or vomiting.  Her abdominal pain has subsided now.  Patient states that she has chronic diarrhea due to IBS, with has not changed.  Patient states that she fainted on Saturday, and injured her head.  No unilateral weakness, numbness or tingling to extremities.  No facial droop or slurred speech.  Denies symptoms of UTI.  Patient does not have chest pain, shortness breath, cough, fever or chills.  Patient wants to quit alcohol and she has not had any alcohol for last 24 hours.  ED Course: pt was found to have sodium 119, negative COVID-19 test, renal function normal, negative pregnancy test, negative urinalysis, abnormal liver function (ALP 54, AST 169, ALT 129, total bilirubin 1.0), temperature normal, blood pressure 118/76, no tachycardia, oxygen saturation 99% on room air, CT head is negative for acute intracranial abnormalities with patient is admitted to stepdown as inpatient.  Review of Systems:   General: no fevers, chills, no body weight gain, has fatigue HEENT: no blurry vision, hearing changes or sore throat Respiratory: no dyspnea, coughing, wheezing CV: no chest pain, no palpitations GI: had nausea, vomiting, abdominal pain, diarrhea, no constipation GU: no dysuria,  burning on urination, increased urinary frequency, hematuria  Ext: no leg edema Neuro: no unilateral weakness, numbness, or tingling, no vision change or hearing loss. Has syncope. Skin: no rash, no skin tear. MSK: No muscle spasm, no deformity, no limitation of range of movement in spin Heme: No easy bruising.  Travel history: No recent long distant travel.  Allergy:  Allergies  Allergen Reactions   Lisinopril     Angioedema   Epinephrine Other (See Comments)    Tachycardia, dizziness   Sulfa Antibiotics      ? reaction    Past Medical History:  Diagnosis Date   Anxiety    Elevated transaminase level    likely secondary to alcohol.  Viral Hep screens normal.   ETOH abuse    Hypertension    Left ankle sprain 09/13/2017   [facility] care: x-ray NORMAL 09/13/17.   Tobacco dependence     Past Surgical History:  Procedure Laterality Date   BREAST BIOPSY Right 07/2018   benign (fibroadenoma)   CATARACT EXTRACTION W/ INTRAOCULAR LENS  IMPLANT, BILATERAL  2008 and 2010   CERVICAL CONE BIOPSY     for HPV   WISDOM TOOTH EXTRACTION      Social History:  reports that she has been smoking cigarettes. She has a 24.00 pack-year smoking history. She has never used smokeless tobacco. She reports current alcohol use. She reports that she does not use drugs.  Family History:  Family History  Problem Relation Age of Onset   Prostate cancer Father    Hypertension Father    Brain  cancer Daughter        neurofibromatosis, optic neurogliomas   Diabetes Maternal Uncle    Heart disease Maternal Grandmother        CBAG in late 7070s   Hypertension Maternal Grandmother    Heart attack Maternal Grandfather [id number]   Arthritis Mother    Hypertension Mother    Alcohol abuse Brother        half brother, maternal   Mental illness Paternal Grandmother    Heart disease Paternal Grandfather    Heart attack Brother [id number]       half brother, maternal   Stroke Neg Hx      Breast cancer Neg Hx      Prior to Admission medications   Medication Sig Start Date End Date Taking? Authorizing Provider  amLODipine (NORVASC) 10 MG tablet Take 1 tablet (10 mg total) by mouth daily. 11/17/18   McGowen, Maryjean MornPhilip H, MD  carvedilol (COREG) 25 MG tablet TAKE 1 TABLET BY MOUTH 2 TIMES DAILY WITH A MEAL. 11/17/18   McGowen, Maryjean MornPhilip H, MD  hydrochlorothiazide (HYDRODIURIL) 25 MG tablet Take 1 tablet (25 mg total) by mouth daily. 11/17/18   McGowen, Maryjean MornPhilip H, MD  losartan (COZAAR) 50 MG tablet 1 tab po qd 11/17/18   McGowen, Maryjean MornPhilip H, MD  norethindrone (CAMILA) 0.35 MG tablet Take 1 tablet by mouth daily.    [provider]  sertraline (ZOLOFT) 100 MG tablet TAKE 1 TABLET BY MOUTH AT BEDTIME 11/17/18   Jeoffrey MassedMcGowen, Philip H, MD    Physical Exam: Vitals:   05/17/19 2234 05/18/19 0222 05/18/19 0300 05/18/19 0500  BP: 138/90 109/73 118/76 111/73  Pulse: 85 82 88 88  Resp: (!) 22 16 17    Temp: 99 F (37.2 C) 98.5 F (36.9 C)    TempSrc: Oral Oral    SpO2: 100% 98% 99%   Weight:      Height:       General: Not in acute distress. Pt is tremulous. HEENT:       Eyes: PERRL, EOMI, no scleral icterus.       ENT: No discharge from the ears and nose, no pharynx injection, no tonsillar enlargement.        Neck: No JVD, no bruit, no mass felt. Heme: No neck lymph node enlargement. Cardiac: S1/S2, RRR, No murmurs, No gallops or rubs. Respiratory: No rales, wheezing, rhonchi or rubs. GI: Soft, nondistended, has mild tenderness in epigastric area and middle abdomen, no rebound pain, no organomegaly, BS present. GU: No hematuria Ext: No pitting leg edema bilaterally. 2+DP/PT pulse bilaterally. Musculoskeletal: No joint deformities, No joint redness or warmth, no limitation of ROM in spin. Skin: No rashes.  Neuro: Alert, oriented X3, cranial nerves II-XII grossly intact, moves all extremities normally. Psych: Patient is not psychotic, no suicidal or hemocidal ideation.  Labs on  Admission: I have personally reviewed following labs and imaging studies  CBC: Recent Labs  Lab 05/17/19 2310  WBC 7.1  NEUTROABS 5.0  HGB 13.0  HCT 35.7*  MCV 94.9  PLT 226   Basic Metabolic Panel: Recent Labs  Lab 05/17/19 2310  NA 119*  K 3.8  CL 81*  CO2 20*  GLUCOSE 104*  BUN 8  CREATININE 0.47  CALCIUM 9.3  MG 1.3*   GFR: Estimated Creatinine Clearance: 90.8 mL/min (by C-G formula based on SCr of 0.47 mg/dL). Liver Function Tests: Recent Labs  Lab 05/17/19 2310  AST 169*  ALT 129*  ALKPHOS 54  BILITOT 1.0  PROT 7.9  ALBUMIN 4.7   No results for input(s): LIPASE, AMYLASE in the last 168 hours. No results for input(s): AMMONIA in the last 168 hours. Coagulation Profile: No results for input(s): INR, PROTIME in the last 168 hours. Cardiac Enzymes: No results for input(s): CKTOTAL, CKMB, CKMBINDEX, TROPONINI in the last 168 hours. BNP (last 3 results) No results for input(s): PROBNP in the last 8760 hours. HbA1C: No results for input(s): HGBA1C in the last 72 hours. CBG: No results for input(s): GLUCAP in the last 168 hours. Lipid Profile: No results for input(s): CHOL, HDL, LDLCALC, TRIG, CHOLHDL, LDLDIRECT in the last 72 hours. Thyroid Function Tests: No results for input(s): TSH, T4TOTAL, FREET4, T3FREE, THYROIDAB in the last 72 hours. Anemia Panel: No results for input(s): VITAMINB12, FOLATE, FERRITIN, TIBC, IRON, RETICCTPCT in the last 72 hours. Urine analysis:    Component Value Date/Time   COLORURINE YELLOW 05/17/2019 2033   APPEARANCEUR CLEAR 05/17/2019 2033   LABSPEC 1.010 05/17/2019 2033   PHURINE 6.0 05/17/2019 2033   GLUCOSEU NEGATIVE 05/17/2019 2033   HGBUR NEGATIVE 05/17/2019 2033   BILIRUBINUR NEGATIVE 05/17/2019 2033   BILIRUBINUR negative 06/05/2016 1035   KETONESUR 15 (A) 05/17/2019 2033   PROTEINUR NEGATIVE 05/17/2019 2033   UROBILINOGEN 4.0 06/05/2016 1035   NITRITE NEGATIVE 05/17/2019 2033   LEUKOCYTESUR NEGATIVE  05/17/2019 2033   Sepsis Labs: @LABRCNTIP (procalcitonin:4,lacticidven:4) ) Recent Results (from the past 240 hour(s))  SARS Coronavirus 2 [facility] order, Performed in [facility] hospital lab) Nasopharyngeal Nasopharyngeal Swab     Status: None   Collection Time: 05/18/19 12:20 AM   Specimen: Nasopharyngeal Swab  Result Value Ref Range Status   SARS Coronavirus 2 NEGATIVE NEGATIVE Final    Comment: (NOTE) If result is NEGATIVE SARS-CoV-2 target nucleic acids are NOT DETECTED. The SARS-CoV-2 RNA is generally detectable in upper and lower  respiratory specimens during the acute phase of infection. The lowest  concentration of SARS-CoV-2 viral copies this assay can detect is 250  copies / mL. A negative result does not preclude SARS-CoV-2 infection  and should not be used as the sole basis for treatment or other  patient management decisions.  A negative result may occur with  improper specimen collection / handling, submission of specimen other  than nasopharyngeal swab, presence of viral mutation(s) within the  areas targeted by this assay, and inadequate number of viral copies  (<250 copies / mL). A negative result must be combined with clinical  observations, patient history, and epidemiological information. If result is POSITIVE SARS-CoV-2 target nucleic acids are DETECTED. The SARS-CoV-2 RNA is generally detectable in upper and lower  respiratory specimens dur ing the acute phase of infection.  Positive  results are indicative of active infection with SARS-CoV-2.  Clinical  correlation with patient history and other diagnostic information is  necessary to determine patient infection status.  Positive results do  not rule out bacterial infection or co-infection with other viruses. If result is PRESUMPTIVE POSTIVE SARS-CoV-2 nucleic acids MAY BE PRESENT.   A presumptive positive result was obtained on the submitted specimen  and confirmed on repeat testing.  While 2019 novel  coronavirus  (SARS-CoV-2) nucleic acids may be present in the submitted sample  additional confirmatory testing may be necessary for epidemiological  and / or clinical management purposes  to differentiate between  SARS-CoV-2 and other Sarbecovirus currently known to infect humans.  If clinically indicated additional testing with an alternate test  methodology [id number]) is advised. The SARS-CoV-2 RNA is generally  detectable in upper and lower respiratory sp ecimens during the acute  phase of infection. The expected result is Negative. Fact Sheet for Patients:  BoilerBrush.com.cy Fact Sheet for Healthcare Providers: https://pope.com/ This test is not yet approved or cleared by the [place] FDA and has been authorized for detection and/or diagnosis of SARS-CoV-2 by FDA under an Emergency Use Authorization (EUA).  This EUA will remain in effect (meaning this test can be used) for the duration of the COVID-19 declaration under Section 564(b)(1) of the Act, 21 U.S.C. section 360bbb-3(b)(1), unless the authorization is terminated or revoked sooner. Performed at [facility], [address] [place], [place] [address]      Radiological Exams on Admission: Ct Head Wo Contrast  Result Date: 05/18/2019 CLINICAL DATA:  Syncopal episode several days ago with persistent headaches, initial encounter EXAM: CT HEAD WITHOUT CONTRAST TECHNIQUE: Contiguous axial images were obtained from the base of the skull through the vertex without intravenous contrast. COMPARISON:  None. FINDINGS: Brain: No evidence of acute infarction, hemorrhage, hydrocephalus, extra-axial collection or mass lesion/mass effect. Vascular: No hyperdense vessel or unexpected calcification. Skull: Normal. Negative for fracture or focal lesion. Sinuses/Orbits: No acute finding. Other: None. IMPRESSION: Normal head CT. Electronically Signed   By: Alcide Clever M.D.   On:  05/18/2019 01:46     EKG: Independently reviewed.  Sinus rhythm, QTC 539, low voltage, LAE, ST depression in V4- V6   Assessment/Plan Principal Problem:   Hyponatremia Active Problems:   TOBACCO USE   Essential hypertension   ETOH abuse   Syncope   Abnormal LFTs   Abdominal pain   Depression   Hypomagnesemia   Hyponatremia: Na 119.  Mental status normal. Most likely due to poor oral intake and dehydration, potomania. Use of HCTZ, cozarr and zoloft may have contributed partially.  -will admit to S[facility] as inpt - Will check urine sodium, urine osmolality, serum osmolality. - check TSH -Hold HCTZ, Cozaar, Zoloft - IVF:  750 cc of NS in ED, will continue with IV normal saline at 75 mL/h - f/u by BMP q6h - avoid correction too fast due to risk of central pontine myelinolysis  Tobacco abuse and Alcohol abuse: -Did counseling about importance of quitting smoking -Nicotine patch -Did counseling about the importance of quitting drinking -CIWA protocol - start librium 10 mg tid  Essential hypertension: -hold HCTZ, Cozaar -Continue amlodipine -IV hydralazine as needed  Syncope: Likely due to alcohol intoxication.  CT head negative for acute intracranial abnormalities.  No focal neurologic findings on physical examination. -IV fluid as above -Check orthostatic status -Frequent neuro check  Abnormal LFTs: Likely due to alcohol abuse. -Check hepatitis panel and HIV antibody -Avoid using liver toxic medications, such as Tylenol  Abdominal pain: Differential diagnosis include alcoholic pancreatitis and possible alcoholic gastritis. -Started Protonix 40 mg daily - Check lipase  Hypomagnesemia: Mg=1.3 -repleted  Depression: -hold zoloft due to QTC prolongation and hyponatremia   Inpatient status:  # Patient requires inpatient status due to high intensity of service, high risk for further deterioration and high frequency of surveillance required.  I certify that at the  point of admission it is my clinical judgment that the patient will require inpatient hospital care spanning beyond 2 midnights from the point of admission.   This patient has multiple chronic comorbidities including alcohol abuse, tobacco abuse, IBS, hypertension, depression  Now patient has presenting with abdominal pain, syncope, alcohol withdrawal and hyponatremia  The worrisome physical exam findings include abdominal  tenderness and patient is tremulous  The initial radiographic and laboratory data are worrisome because of hyponatremia, hypomagnesemia, abnormal liver function  Current medical needs: please see my assessment and plan Predictability of an adverse outcome (risk): Patient has multiple comorbidities, now presents with abdominal pain, syncope and hyponatremia with sodium of 119.  Will need to correct sodium slowly to avoid the risk of central pontine myelinolysis, therefore patient will need to be treated in hospital for at least 2 days.      DVT ppx: SQ Lovenox Code Status: Full code Family Communication: None at bed side.    Disposition Plan:  Anticipate discharge back to previous home environment Consults called:  none Admission status:    SDU/inpation       Date of Service 05/18/2019    Lorretta HarpXilin Baylei Siebels [facility]   If 7PM-7AM, please contact night-coverage [email or web address] Password [email or web address] 05/18/2019, 6:03 AM

## 2019-05-18 NOTE — Progress Notes (Signed)
Carotid artery duplex completed. Refer to "CV Proc" under chart review to view preliminary results.  05/18/2019 3:23 PM Maudry Mayhew, MHA, RVT, RDCS, RDMS

## 2019-05-19 DIAGNOSIS — F1010 Alcohol abuse, uncomplicated: Secondary | ICD-10-CM

## 2019-05-19 DIAGNOSIS — R945 Abnormal results of liver function studies: Secondary | ICD-10-CM

## 2019-05-19 DIAGNOSIS — I10 Essential (primary) hypertension: Secondary | ICD-10-CM

## 2019-05-19 DIAGNOSIS — F10230 Alcohol dependence with withdrawal, uncomplicated: Secondary | ICD-10-CM

## 2019-05-19 LAB — COMPREHENSIVE METABOLIC PANEL
ALT: 86 U/L — ABNORMAL HIGH (ref 0–44)
AST: 74 U/L — ABNORMAL HIGH (ref 15–41)
Albumin: 3.7 g/dL (ref 3.5–5.0)
Alkaline Phosphatase: 46 U/L (ref 38–126)
Anion gap: 11 (ref 5–15)
BUN: <5 mg/dL — ABNORMAL LOW (ref 6–20)
CO2: 25 mmol/L (ref 22–32)
Calcium: 9 mg/dL (ref 8.9–10.3)
Chloride: 99 mmol/L (ref 98–111)
Creatinine, Ser: 0.58 mg/dL (ref 0.44–1.00)
GFR calc Af Amer: >60 mL/min (ref >60)
GFR calc non Af Amer: >60 mL/min (ref >60)
Glucose, Bld: 91 mg/dL (ref 70–99)
Potassium: 3.8 mmol/L (ref 3.5–5.1)
Sodium: 135 mmol/L (ref 135–145)
Total Bilirubin: 0.8 mg/dL (ref 0.3–1.2)
Total Protein: 6.6 g/dL (ref 6.5–8.1)

## 2019-05-19 LAB — CBC
HCT: 35.3 % — ABNORMAL LOW (ref 36.0–46.0)
Hemoglobin: 12.3 g/dL (ref 12.0–15.0)
MCH: 34.9 pg — ABNORMAL HIGH (ref 26.0–34.0)
MCHC: 34.8 g/dL (ref 30.0–36.0)
MCV: 100.3 fL — ABNORMAL HIGH (ref 80.0–100.0)
Platelets: 217 10*3/uL (ref 150–400)
RBC: 3.52 MIL/uL — ABNORMAL LOW (ref 3.87–5.11)
RDW: 11.7 % (ref 11.5–15.5)
WBC: 5.4 10*3/uL (ref 4.0–10.5)
nRBC: 0 % (ref 0.0–0.2)

## 2019-05-19 LAB — MAGNESIUM: Magnesium: 1.8 mg/dL (ref 1.7–2.4)

## 2019-05-19 LAB — SODIUM, URINE, RANDOM: Sodium, Ur: 19 mmol/L

## 2019-05-19 LAB — HEPATITIS PANEL, ACUTE
HCV Ab: <0.1 s/co ratio (ref 0.0–0.9)
Hep A IgM: NEGATIVE
Hep B C IgM: NEGATIVE
Hepatitis B Surface Ag: NEGATIVE

## 2019-05-19 LAB — OSMOLALITY, URINE: Osmolality, Ur: 123 mOsm/kg — ABNORMAL LOW (ref 300–900)

## 2019-05-19 LAB — CREATININE, URINE, RANDOM: Creatinine, Urine: 33.53 mg/dL

## 2019-05-19 MED ORDER — GABAPENTIN 300 MG PO CAPS
300.0000 mg | ORAL_CAPSULE | Freq: Three times a day (TID) | ORAL | Status: DC
  Administered 2019-05-19 – 2019-05-20 (×4): 300 mg via ORAL
  Filled 2019-05-19 (×4): qty 1

## 2019-05-19 MED ORDER — PANTOPRAZOLE SODIUM 40 MG PO TBEC
40.0000 mg | DELAYED_RELEASE_TABLET | Freq: Two times a day (BID) | ORAL | Status: DC
  Administered 2019-05-19 – 2019-05-21 (×4): 40 mg via ORAL
  Filled 2019-05-19 (×4): qty 1

## 2019-05-19 MED ORDER — AMLODIPINE BESYLATE 5 MG PO TABS
5.0000 mg | ORAL_TABLET | Freq: Every day | ORAL | Status: DC
  Administered 2019-05-20 – 2019-05-21 (×2): 5 mg via ORAL
  Filled 2019-05-19 (×2): qty 1

## 2019-05-19 MED ORDER — FOLIC ACID 1 MG PO TABS
1.0000 mg | ORAL_TABLET | Freq: Every day | ORAL | Status: DC
  Administered 2019-05-19 – 2019-05-21 (×3): 1 mg via ORAL
  Filled 2019-05-19 (×3): qty 1

## 2019-05-19 MED ORDER — VITAMIN B-1 100 MG PO TABS
100.0000 mg | ORAL_TABLET | Freq: Every day | ORAL | Status: DC
  Administered 2019-05-19 – 2019-05-21 (×3): 100 mg via ORAL
  Filled 2019-05-19 (×3): qty 1

## 2019-05-19 NOTE — Progress Notes (Signed)
PROGRESS NOTE    Michelle Myers  BJY:[id number] DOB: Feb 19, 1976 DOA: 05/17/2019 PCP: Jeoffrey Massed, MD      Brief Narrative:  Michelle Myers is a 43 y.o. F with HTN and alcohol use disorder who presents with progressive epigastric pain and syncope.  In the last month, she admitted having progressive epigastric pain and eructation, worsened by alcohol intake.  Finally, the morning of admission, she passed out in the kitchen and her husband brought her to the ER.  In the ER, patient found to have Na 119.  Started on IV fluids and CIWA and admitted.      Assessment & Plan:  Alcohol withdrawal Alcohol use disorder The patient describes escalating alcohol use pattern over the last 10 years.  She is a stay-at-home mom, and was hiding boxes of wine in her closet and surreptitiously drinking them when her husband was at work or when he to work out.  -Continue CIWA scoring -Continue on-demand lorazepam -Start gabapentin for subacute withdrawal, taper off as outpatient after 1-2 months -Continue thiamine and folate -Consider naltrexone at discharge if LFTs continue to improve  Syncope This was from hyponatremia and alcohol use.  Normal echocardiogram, CT head, and carotid ultrasound results are noted.  Hyponatremia From alcohol, dehdyration. Now resolved. -Stop IVF -Hold HCTZ and SSRI  Hypertension BP low normal -Continue carvedilol -Reduce amlodipine -Hold losartan and HCTZ  Transaminitis Improving.  Epigastric pain Likely gastritis from alcohol.  Improving today.  Lipase normal, doubt pancreatitis. -Continue pantoprazole at discharge, reduce to once daily           MDM and disposition: The below labs and imaging reports were reviewed and summarized above.  Medication management as above.  The patient was admitted with alcohol withdrawal and hyponatremia.  She remains withdrawing actively, is unsteady and a fall risk with walking.  At this time, continued  inpatient services are reasonable and expected, given the high likelihood of an adverse outcome, including readmission or debility if the patient were to be discharged prematurely.        DVT prophylaxis: Lovenox Code Status: FULL Family Communication: Called to husband, no answer    Procedures:   Echo  Carotid US  CT head    Subjective: Feeling anxious, headache, trouble concentrating, weak, wobbly.  No chest pain, no vomiting, no confusion, disorientation.  Objective: Vitals:   05/18/19 2031 05/19/19 0639 05/19/19 1329 05/19/19 1706  BP: 99/70 (!) 124/94 91/61 (!) 127/92  Pulse: 91 (!) 101 82   Resp: 17 18 18    Temp: 98.1 F (36.7 C) 98.6 F (37 C) 98.4 F (36.9 C)   TempSrc: Oral Oral Oral   SpO2: 95% 98% 98%   Weight:      Height:        Intake/Output Summary (Last 24 hours) at 05/19/2019 1732 Last data filed at 05/19/2019 0900 Gross per 24 hour  Intake 458 ml  Output 200 ml  Net 258 ml   Filed Weights   05/17/19 2027 05/18/19 0500  Weight: 72.9 kg 71.2 kg    Examination: General appearance:  adult female, alert and mild distress from withdrawing.   HEENT: Anicteric, conjunctiva pink, lids and lashes normal. No nasal deformity, discharge, epistaxis.  Lips moist.   Skin: Warm and dry.  no jaundice.  No suspicious rashes or lesions. Cardiac: RRR, nl S1-S2, no murmurs appreciated.  Capillary refill is brisk.  JVP normal.  No LE edema.  Radial pulses 2+ and symmetric. Respiratory: Normal respiratory  rate and rhythm.  CTAB without rales or wheezes. Abdomen: Abdomen soft.  No TTP. No ascites, distension, hepatosplenomegaly.   MSK: No deformities or effusions. Neuro: Awake and alert but agitated, tremulous.  EOMI, moves all extremities. Speech pressured.    Psych: Sensorium intact and responding to questions, attention distracted, concentration and through processes tangential. Affect blunte.  Judgment and insight appear normal.    Data Reviewed: I have  personally reviewed following labs and imaging studies:  CBC: Recent Labs  Lab 05/17/19 2310 05/18/19 0852 05/19/19 0515  WBC 7.1 5.1 5.4  NEUTROABS 5.0 3.0  --   HGB 13.0 12.4 12.3  HCT 35.7* 33.8* 35.3*  MCV 94.9 95.5 100.3*  PLT 226 220 217   Basic Metabolic Panel: Recent Labs  Lab 05/17/19 2310 05/18/19 0852 05/18/19 1531 05/18/19 2128 05/19/19 0515  NA 119* 131* 130* 131* 135  K 3.8 3.6 3.9 4.6 3.8  CL 81* 93* 93* 95* 99  CO2 20* 25 25 25 25   GLUCOSE 104* 97 146* 108* 91  BUN 8 6 9 7  <5*  CREATININE 0.47 0.65 0.75 0.71 0.58  CALCIUM 9.3 9.4 9.3 9.1 9.0  MG 1.3* 2.1  --   --  1.8   GFR: Estimated Creatinine Clearance: 89.8 mL/min (by C-G formula based on SCr of 0.58 mg/dL). Liver Function Tests: Recent Labs  Lab 05/17/19 2310 05/18/19 0852 05/19/19 0515  AST 169* 114* 74*  ALT 129* 105* 86*  ALKPHOS 54 47 46  BILITOT 1.0 1.2 0.8  PROT 7.9 6.7 6.6  ALBUMIN 4.7 3.9 3.7   Recent Labs  Lab 05/18/19 0852  LIPASE 29   No results for input(s): AMMONIA in the last 168 hours. Coagulation Profile: No results for input(s): INR, PROTIME in the last 168 hours. Cardiac Enzymes: No results for input(s): CKTOTAL, CKMB, CKMBINDEX, TROPONINI in the last 168 hours. BNP (last 3 results) No results for input(s): PROBNP in the last 8760 hours. HbA1C: No results for input(s): HGBA1C in the last 72 hours. CBG: No results for input(s): GLUCAP in the last 168 hours. Lipid Profile: No results for input(s): CHOL, HDL, LDLCALC, TRIG, CHOLHDL, LDLDIRECT in the last 72 hours. Thyroid Function Tests: Recent Labs    05/18/19 0852  TSH 5.012*   Anemia Panel: No results for input(s): VITAMINB12, FOLATE, FERRITIN, TIBC, IRON, RETICCTPCT in the last 72 hours. Urine analysis:    Component Value Date/Time   COLORURINE YELLOW 05/17/2019 2033   APPEARANCEUR CLEAR 05/17/2019 2033   LABSPEC 1.010 05/17/2019 2033   PHURINE 6.0 05/17/2019 2033   GLUCOSEU NEGATIVE 05/17/2019  2033   HGBUR NEGATIVE 05/17/2019 2033   BILIRUBINUR NEGATIVE 05/17/2019 2033   BILIRUBINUR negative 06/05/2016 1035   KETONESUR 15 (A) 05/17/2019 2033   PROTEINUR NEGATIVE 05/17/2019 2033   UROBILINOGEN 4.0 06/05/2016 1035   NITRITE NEGATIVE 05/17/2019 2033   LEUKOCYTESUR NEGATIVE 05/17/2019 2033   Sepsis Labs: @LABRCNTIP (procalcitonin:4,lacticacidven:4)  ) Recent Results (from the past 240 hour(s))  SARS Coronavirus 2 [facility](Hospital order, Performed in [facility] hospital lab) Nasopharyngeal Nasopharyngeal Swab     Status: None   Collection Time: 05/18/19 12:20 AM   Specimen: Nasopharyngeal Swab  Result Value Ref Range Status   SARS Coronavirus 2 NEGATIVE NEGATIVE Final    Comment: (NOTE) If result is NEGATIVE SARS-CoV-2 target nucleic acids are NOT DETECTED. The SARS-CoV-2 RNA is generally detectable in upper and lower  respiratory specimens during the acute phase of infection. The lowest  concentration of SARS-CoV-2 viral copies this assay  can detect is 250  copies / mL. A negative result does not preclude SARS-CoV-2 infection  and should not be used as the sole basis for treatment or other  patient management decisions.  A negative result may occur with  improper specimen collection / handling, submission of specimen other  than nasopharyngeal swab, presence of viral mutation(s) within the  areas targeted by this assay, and inadequate number of viral copies  (<250 copies / mL). A negative result must be combined with clinical  observations, patient history, and epidemiological information. If result is POSITIVE SARS-CoV-2 target nucleic acids are DETECTED. The SARS-CoV-2 RNA is generally detectable in upper and lower  respiratory specimens dur ing the acute phase of infection.  Positive  results are indicative of active infection with SARS-CoV-2.  Clinical  correlation with patient history and other diagnostic information is  necessary to determine patient infection status.   Positive results do  not rule out bacterial infection or co-infection with other viruses. If result is PRESUMPTIVE POSTIVE SARS-CoV-2 nucleic acids MAY BE PRESENT.   A presumptive positive result was obtained on the submitted specimen  and confirmed on repeat testing.  While 2019 novel coronavirus  (SARS-CoV-2) nucleic acids may be present in the submitted sample  additional confirmatory testing may be necessary for epidemiological  and / or clinical management purposes  to differentiate between  SARS-CoV-2 and other Sarbecovirus currently known to infect humans.  If clinically indicated additional testing with an alternate test  methodology [phone] is advised. The SARS-CoV-2 RNA is generally  detectable in upper and lower respiratory sp ecimens during the acute  phase of infection. The expected result is Negative. Fact Sheet for Patients:  BoilerBrush.com.cyhttps://www.fda.gov/media/136312/download Fact Sheet for Healthcare Providers: https://pope.com/https://www.fda.gov/media/136313/download This test is not yet approved or cleared by the [place] FDA and has been authorized for detection and/or diagnosis of SARS-CoV-2 by FDA under an Emergency Use Authorization (EUA).  This EUA will remain in effect (meaning this test can be used) for the duration of the COVID-19 declaration under Section 564(b)(1) of the Act, 21 U.S.C. section 360bbb-3(b)(1), unless the authorization is terminated or revoked sooner. Performed at [facility] [place], [address] [place], [place] [address]          Radiology Studies: Ct Head Wo Contrast  Result Date: 05/18/2019 CLINICAL DATA:  Syncopal episode several days ago with persistent headaches, initial encounter EXAM: CT HEAD WITHOUT CONTRAST TECHNIQUE: Contiguous axial images were obtained from the base of the skull through the vertex without intravenous contrast. COMPARISON:  None. FINDINGS: Brain: No evidence of acute infarction, hemorrhage, hydrocephalus, extra-axial  collection or mass lesion/mass effect. Vascular: No hyperdense vessel or unexpected calcification. Skull: Normal. Negative for fracture or focal lesion. Sinuses/Orbits: No acute finding. Other: None. IMPRESSION: Normal head CT. Electronically Signed   By: Alcide CleverMark  Lukens M.D.   On: 05/18/2019 01:46   [place] Carotid  Result Date: 05/18/2019 Carotid Arterial Duplex Study Indications:  Syncope. Risk Factors: Hypertension, current smoker. Performing Technologist: Gertie FeyMichelle Simonetti MHA, RDMS, RVT, RDCS  Examination Guidelines: A complete evaluation includes B-mode imaging, spectral Doppler, color Doppler, and power Doppler as needed of all accessible portions of each vessel. Bilateral testing is considered an integral part of a complete examination. Limited examinations for reoccurring indications may be performed as noted.  Right Carotid Findings: +----------+--------+--------+--------+-----------------------+--------+             PSV cm/s EDV cm/s Stenosis Describe  Comments  +----------+--------+--------+--------+-----------------------+--------+  CCA Prox   64       17                                                  +----------+--------+--------+--------+-----------------------+--------+  CCA Distal 66       16                                                  +----------+--------+--------+--------+-----------------------+--------+  ICA Prox   39       18                smooth and heterogenous           +----------+--------+--------+--------+-----------------------+--------+  ICA Distal 72       33                                                  +----------+--------+--------+--------+-----------------------+--------+  ECA        73       24                                                  +----------+--------+--------+--------+-----------------------+--------+ +----------+--------+-------+----------------+-------------------+             PSV cm/s EDV cms Describe         Arm Pressure (mmHG)   +----------+--------+-------+----------------+-------------------+  Subclavian 72               Multiphasic, WNL                      +----------+--------+-------+----------------+-------------------+ +---------+--------+--+--------+--+---------+  Vertebral PSV cm/s 38 EDV cm/s 15 Antegrade  +---------+--------+--+--------+--+---------+  Left Carotid Findings: +----------+--------+--------+--------+--------+--------+             PSV cm/s EDV cm/s Stenosis Describe Comments  +----------+--------+--------+--------+--------+--------+  CCA Prox   74       19                                   +----------+--------+--------+--------+--------+--------+  CCA Distal 73       21                                   +----------+--------+--------+--------+--------+--------+  ICA Prox   63       16                                   +----------+--------+--------+--------+--------+--------+  ICA Distal 54       24                                   +----------+--------+--------+--------+--------+--------+  ECA  54       13                                   +----------+--------+--------+--------+--------+--------+ +----------+--------+--------+----------------+-------------------+  Subclavian PSV cm/s EDV cm/s Describe         Arm Pressure (mmHG)  +----------+--------+--------+----------------+-------------------+             88                Multiphasic, WNL                      +----------+--------+--------+----------------+-------------------+ +---------+--------+--+--------+--+---------+  Vertebral PSV cm/s 42 EDV cm/s 18 Antegrade  +---------+--------+--+--------+--+---------+  Summary: Right Carotid: Velocities in the right ICA are consistent with a 1-39% stenosis. Left Carotid: Velocities in the left ICA are consistent with a 1-39% stenosis. Vertebrals:  Bilateral vertebral arteries demonstrate antegrade flow. Subclavians: Normal flow hemodynamics were seen in bilateral subclavian              arteries. *See table(s) above  for measurements and observations.  Electronically signed by Deitra Mayo MD on 05/18/2019 at 4:54:27 PM.    Final         Scheduled Meds:  amLODipine  10 mg Oral Daily   carvedilol  25 mg Oral BID WC   enoxaparin (LOVENOX) injection  40 mg Subcutaneous Q24H   gabapentin  300 mg Oral TID   LORazepam  0-4 mg Intravenous Q6H   Or   LORazepam  0-4 mg Oral Q6H   [START ON 05/20/2019] LORazepam  0-4 mg Intravenous Q12H   Or   [START ON 05/20/2019] LORazepam  0-4 mg Oral Q12H   nicotine  21 mg Transdermal Daily   norethindrone  1 tablet Oral Daily   [START ON 05/20/2019] pantoprazole  40 mg Oral BID AC   pneumococcal 23 valent vaccine  0.5 mL Intramuscular Tomorrow-1000   Continuous Infusions:    LOS: 1 day    Time spent: 25 minutes    Edwin Dada, MD [facility] 05/19/2019, 5:32 PM     Please page through [place]:  [email or web address] Password [email or web address] If 7PM-7AM, please contact night-coverage

## 2019-05-19 NOTE — Evaluation (Signed)
Physical Therapy Evaluation Patient Details Name: Michelle Myers MRN: [id number] DOB: 10/13/1976 Today's Date: 05/19/2019   History of Present Illness  Michelle Myers is a 43 y.o. female with medical history significant of alcohol abuse, tobacco abuse, IBS, hypertension, depression, who presents with abdominal pain, syncope.  Clinical Impression   Pt admitted with above diagnosis. Pt currently with functional limitations due to the deficits listed below (see PT Problem List). Independent prior to admission; Presents with fall risk, unsteadiness with gait;  Pt will benefit from skilled PT to increase their independence and safety with mobility to allow discharge to the venue listed below.       Follow Up Recommendations No PT follow up;Other (comment)(Pt expressed desire to go to Inpatient Alcohol Rehab -- if she can get there, that is her best option; I believe with a cane or RW, she will be able to walk modified independently)    Equipment Recommendations  Other (comment)(will consider RW versus cane; to discern next session)    Recommendations for Other Services Other (comment)(SW for resources and support to quit drinking)     Precautions / Restrictions Precautions Precautions: Fall      Mobility  Bed Mobility Overal bed mobility: Needs Assistance Bed Mobility: Supine to Sit     Supine to sit: Supervision     General bed mobility comments: Supervision for safety  Transfers Overall transfer level: Needs assistance Equipment used: None Transfers: Sit to/from Stand Sit to Stand: Supervision         General transfer comment: Supervision for safety; noted slight unsteadiness  Ambulation/Gait Ambulation/Gait assistance: Min assist Gait Distance (Feet): 120 Feet Assistive device: None;IV Pole Gait Pattern/deviations: Step-through pattern     General Gait Details: Tending to lean R throughout walk  Stairs            Wheelchair Mobility    Modified Rankin  (Stroke Patients Only)       Balance Overall balance assessment: Needs assistance   Sitting balance-Leahy Scale: Good     Standing balance support: During functional activity Standing balance-Leahy Scale: Fair                               Pertinent Vitals/Pain Pain Assessment: No/denies pain    Home Living Family/patient expects to be discharged to:: Private residence Living Arrangements: Children;Spouse/significant other Available Help at Discharge: Family;Available PRN/intermittently Type of Home: House Home Access: (to be determined)     Home Layout: (to be determined)        Prior Function Level of Independence: Independent               Hand Dominance        Extremity/Trunk Assessment   Upper Extremity Assessment Upper Extremity Assessment: Overall WFL for tasks assessed    Lower Extremity Assessment Lower Extremity Assessment: Generalized weakness(and dyscoordination)       Communication   Communication: No difficulties  Cognition Arousal/Alertness: Awake/alert Behavior During Therapy: Impulsive Overall Cognitive Status: Impaired/Different from baseline Area of Impairment: Safety/judgement;Awareness                         Safety/Judgement: Decreased awareness of safety;Decreased awareness of deficits Awareness: Intellectual          General Comments General comments (skin integrity, edema, etc.): HR range 112-127 during session    Exercises     Assessment/Plan    PT Assessment Patient  needs continued PT services  PT Problem List Decreased activity tolerance;Decreased balance;Decreased knowledge of use of DME;Decreased knowledge of precautions       PT Treatment Interventions DME instruction;Gait training;Stair training;Functional mobility training;Therapeutic activities;Balance training;Therapeutic exercise;Patient/family education    PT Goals (Current goals can be found in the Care Plan section)  Acute  Rehab PT Goals Patient Stated Goal: expressed desire to quit drinking PT Goal Formulation: With patient Time For Goal Achievement: 06/02/19 Potential to Achieve Goals: Good    Frequency Min 3X/week   Barriers to discharge        Co-evaluation               AM-PAC PT "6 Clicks" Mobility  Outcome Measure Help needed turning from your back to your side while in a flat bed without using bedrails?: None Help needed moving from lying on your back to sitting on the side of a flat bed without using bedrails?: None Help needed moving to and from a bed to a chair (including a wheelchair)?: None Help needed standing up from a chair using your arms (e.g., wheelchair or bedside chair)?: A Little Help needed to walk in hospital room?: A Little Help needed climbing 3-5 steps with a railing? : A Little 6 Click Score: 21    End of Session Equipment Utilized During Treatment: Gait belt Activity Tolerance: Patient tolerated treatment well Patient left: in bed;with call bell/phone within reach Nurse Communication: Mobility status PT Visit Diagnosis: Unsteadiness on feet (R26.81)    Time: [id number] PT Time Calculation (min) (ACUTE ONLY): 32 min   Charges:   PT Evaluation $PT Eval Moderate Complexity: 1 Mod PT Treatments $Gait Training: 8-22 mins        Van ClinesHolly Marise Knapper, PT  Acute Rehabilitation Services Pager [phone] Office [phone]   Levi AlandHolly H Domique Clapper 05/19/2019, 4:39 PM

## 2019-05-20 DIAGNOSIS — E871 Hypo-osmolality and hyponatremia: Secondary | ICD-10-CM | POA: Diagnosis not present

## 2019-05-20 DIAGNOSIS — R55 Syncope and collapse: Secondary | ICD-10-CM | POA: Diagnosis not present

## 2019-05-20 DIAGNOSIS — I10 Essential (primary) hypertension: Secondary | ICD-10-CM | POA: Diagnosis not present

## 2019-05-20 DIAGNOSIS — K7010 Alcoholic hepatitis without ascites: Secondary | ICD-10-CM

## 2019-05-20 DIAGNOSIS — F1010 Alcohol abuse, uncomplicated: Secondary | ICD-10-CM | POA: Diagnosis not present

## 2019-05-20 DIAGNOSIS — F10230 Alcohol dependence with withdrawal, uncomplicated: Secondary | ICD-10-CM | POA: Diagnosis not present

## 2019-05-20 DIAGNOSIS — R945 Abnormal results of liver function studies: Secondary | ICD-10-CM | POA: Diagnosis not present

## 2019-05-20 LAB — CBC
HCT: 35.2 % — ABNORMAL LOW (ref 36.0–46.0)
Hemoglobin: 12 g/dL (ref 12.0–15.0)
MCH: 34.9 pg — ABNORMAL HIGH (ref 26.0–34.0)
MCHC: 34.1 g/dL (ref 30.0–36.0)
MCV: 102.3 fL — ABNORMAL HIGH (ref 80.0–100.0)
Platelets: 211 10*3/uL (ref 150–400)
RBC: 3.44 MIL/uL — ABNORMAL LOW (ref 3.87–5.11)
RDW: 11.7 % (ref 11.5–15.5)
WBC: 5.4 10*3/uL (ref 4.0–10.5)
nRBC: 0 % (ref 0.0–0.2)

## 2019-05-20 LAB — COMPREHENSIVE METABOLIC PANEL
ALT: 79 U/L — ABNORMAL HIGH (ref 0–44)
AST: 75 U/L — ABNORMAL HIGH (ref 15–41)
Albumin: 3.5 g/dL (ref 3.5–5.0)
Alkaline Phosphatase: 48 U/L (ref 38–126)
Anion gap: 8 (ref 5–15)
BUN: <5 mg/dL — ABNORMAL LOW (ref 6–20)
CO2: 25 mmol/L (ref 22–32)
Calcium: 8.8 mg/dL — ABNORMAL LOW (ref 8.9–10.3)
Chloride: 103 mmol/L (ref 98–111)
Creatinine, Ser: 0.69 mg/dL (ref 0.44–1.00)
GFR calc Af Amer: >60 mL/min (ref >60)
GFR calc non Af Amer: >60 mL/min (ref >60)
Glucose, Bld: 114 mg/dL — ABNORMAL HIGH (ref 70–99)
Potassium: 4.3 mmol/L (ref 3.5–5.1)
Sodium: 136 mmol/L (ref 135–145)
Total Bilirubin: 0.5 mg/dL (ref 0.3–1.2)
Total Protein: 6.1 g/dL — ABNORMAL LOW (ref 6.5–8.1)

## 2019-05-20 MED ORDER — HYDROXYZINE HCL 10 MG PO TABS: 10.0000 mg | ORAL_TABLET | Freq: Three times a day (TID) | ORAL | Status: DC | PRN

## 2019-05-20 MED ORDER — LORAZEPAM 1 MG PO TABS: 0.0000 mg | ORAL_TABLET | Freq: Two times a day (BID) | ORAL | Status: DC

## 2019-05-20 MED ORDER — GABAPENTIN 100 MG PO CAPS
200.0000 mg | ORAL_CAPSULE | Freq: Three times a day (TID) | ORAL | Status: DC
  Administered 2019-05-20 – 2019-05-21 (×2): 200 mg via ORAL
  Filled 2019-05-20 (×2): qty 2

## 2019-05-20 MED ORDER — LORAZEPAM 2 MG/ML IJ SOLN: 0.0000 mg | Freq: Two times a day (BID) | INTRAMUSCULAR | Status: DC

## 2019-05-20 NOTE — Plan of Care (Signed)
  Problem: Coping: Goal: Level of anxiety will decrease Outcome: Not Met (add Reason) Pt continuing to experience increased anxiety about detox and withdrawal process. Pt also concerned about discharge and next steps for treatment after discharge from hospital.

## 2019-05-20 NOTE — Progress Notes (Addendum)
PROGRESS NOTE    Michelle Myers  ZOX:[id number]  DOB: 07-18-1976  DOA: 05/17/2019 PCP: Jeoffrey MassedMcGowen, Philip H, MD  Brief Narrative:  43 y.o. F with HTN and alcohol use disorder who presents with progressive epigastric pain and syncope. She admits to alcohol dependence and worsening symptoms with increased drinking lately.  She states her alcohol use pattern has been incremental over the last 10 years. She is a stay-at-home mom, and was hiding boxes of wine in her closet and surreptitiously drinking them when her husband was at work.  On the morning of admission, she passed out in the kitchen and her husband brought her to the ER. Work-up here revealed hyponatremia with sodium at 119.  Patient admitted to medical service with CIWA protocol and started on IV fluids.    Subjective:  Patient motivated to go to inpatient substance abuse center and discussed with social worker today who gave her a list of places.  She states she was able to get in touch with [facility] who stated she could come tomorrow as they have a bed available but will need to be accepted after the review medical records (need to be faxed to them in the morning at fax number [phone].)  Objective: Vitals:   05/20/19 1035 05/20/19 1300 05/20/19 1749 05/20/19 1858  BP: 114/89 121/86 (!) 134/99 126/89  Pulse:  87 76 78  Resp:    20  Temp:    98.4 F (36.9 C)  TempSrc:    Oral  SpO2:    99%  Weight:      Height:        Intake/Output Summary (Last 24 hours) at 05/20/2019 2002 Last data filed at 05/20/2019 1802 Gross per 24 hour  Intake 880 ml  Output 1500 ml  Net -620 ml   Filed Weights   05/17/19 2027 05/18/19 0500 05/20/19 0406  Weight: 72.9 kg 71.2 kg 69.7 kg    Physical Examination:  General exam: Appears anxious and mildly tremulous (chronic per patient) Respiratory system: Clear to auscultation. Respiratory effort normal. Cardiovascular system: S1 & S2 heard, RRR. No JVD, murmurs, rubs, gallops or  clicks. No pedal edema. Gastrointestinal system: Abdomen is nondistended, soft and nontender. No organomegaly or masses felt. Normal bowel sounds heard. Central nervous system: Alert and oriented. No focal neurological deficits. Extremities: Symmetric 5 x 5 power. Skin: No rashes, lesions or ulcers Psychiatry: Mood & affect anxious.    Data Reviewed: I have personally reviewed following labs and imaging studies  CBC: Recent Labs  Lab 05/17/19 2310 05/18/19 0852 05/19/19 0515 05/20/19 0351  WBC 7.1 5.1 5.4 5.4  NEUTROABS 5.0 3.0  --   --   HGB 13.0 12.4 12.3 12.0  HCT 35.7* 33.8* 35.3* 35.2*  MCV 94.9 95.5 100.3* 102.3*  PLT 226 220 217 211   Basic Metabolic Panel: Recent Labs  Lab 05/17/19 2310 05/18/19 0852 05/18/19 1531 05/18/19 2128 05/19/19 0515 05/20/19 0351  NA 119* 131* 130* 131* 135 136  K 3.8 3.6 3.9 4.6 3.8 4.3  CL 81* 93* 93* 95* 99 103  CO2 20* 25 25 25 25 25   GLUCOSE 104* 97 146* 108* 91 114*  BUN 8 6 9 7  <5* <5*  CREATININE 0.47 0.65 0.75 0.71 0.58 0.69  CALCIUM 9.3 9.4 9.3 9.1 9.0 8.8*  MG 1.3* 2.1  --   --  1.8  --    GFR: Estimated Creatinine Clearance: 88.9 mL/min (by C-G formula based on SCr of 0.69 mg/dL). Liver  Function Tests: Recent Labs  Lab 05/17/19 2310 05/18/19 0852 05/19/19 0515 05/20/19 0351  AST 169* 114* 74* 75*  ALT 129* 105* 86* 79*  ALKPHOS 54 47 46 48  BILITOT 1.0 1.2 0.8 0.5  PROT 7.9 6.7 6.6 6.1*  ALBUMIN 4.7 3.9 3.7 3.5   Recent Labs  Lab 05/18/19 0852  LIPASE 29   No results for input(s): AMMONIA in the last 168 hours. Coagulation Profile: No results for input(s): INR, PROTIME in the last 168 hours. Cardiac Enzymes: No results for input(s): CKTOTAL, CKMB, CKMBINDEX, TROPONINI in the last 168 hours. BNP (last 3 results) No results for input(s): PROBNP in the last 8760 hours. HbA1C: No results for input(s): HGBA1C in the last 72 hours. CBG: No results for input(s): GLUCAP in the last 168 hours. Lipid  Profile: No results for input(s): CHOL, HDL, LDLCALC, TRIG, CHOLHDL, LDLDIRECT in the last 72 hours. Thyroid Function Tests: Recent Labs    05/18/19 0852  TSH 5.012*   Anemia Panel: No results for input(s): VITAMINB12, FOLATE, FERRITIN, TIBC, IRON, RETICCTPCT in the last 72 hours. Sepsis Labs: No results for input(s): PROCALCITON, LATICACIDVEN in the last 168 hours.  Recent Results (from the past 240 hour(s))  SARS Coronavirus 2 [facility] order, Performed in [facility] hospital lab) Nasopharyngeal Nasopharyngeal Swab     Status: None   Collection Time: 05/18/19 12:20 AM   Specimen: Nasopharyngeal Swab  Result Value Ref Range Status   SARS Coronavirus 2 NEGATIVE NEGATIVE Final    Comment: (NOTE) If result is NEGATIVE SARS-CoV-2 target nucleic acids are NOT DETECTED. The SARS-CoV-2 RNA is generally detectable in upper and lower  respiratory specimens during the acute phase of infection. The lowest  concentration of SARS-CoV-2 viral copies this assay can detect is 250  copies / mL. A negative result does not preclude SARS-CoV-2 infection  and should not be used as the sole basis for treatment or other  patient management decisions.  A negative result may occur with  improper specimen collection / handling, submission of specimen other  than nasopharyngeal swab, presence of viral mutation(s) within the  areas targeted by this assay, and inadequate number of viral copies  (<250 copies / mL). A negative result must be combined with clinical  observations, patient history, and epidemiological information. If result is POSITIVE SARS-CoV-2 target nucleic acids are DETECTED. The SARS-CoV-2 RNA is generally detectable in upper and lower  respiratory specimens dur ing the acute phase of infection.  Positive  results are indicative of active infection with SARS-CoV-2.  Clinical  correlation with patient history and other diagnostic information is  necessary to determine patient infection  status.  Positive results do  not rule out bacterial infection or co-infection with other viruses. If result is PRESUMPTIVE POSTIVE SARS-CoV-2 nucleic acids MAY BE PRESENT.   A presumptive positive result was obtained on the submitted specimen  and confirmed on repeat testing.  While 2019 novel coronavirus  (SARS-CoV-2) nucleic acids may be present in the submitted sample  additional confirmatory testing may be necessary for epidemiological  and / or clinical management purposes  to differentiate between  SARS-CoV-2 and other Sarbecovirus currently known to infect humans.  If clinically indicated additional testing with an alternate test  methodology [phone]) is advised. The SARS-CoV-2 RNA is generally  detectable in upper and lower respiratory sp ecimens during the acute  phase of infection. The expected result is Negative. Fact Sheet for Patients:  StrictlyIdeas.no Fact Sheet for Healthcare Providers: BankingDealers.co.za This test is  not yet approved or cleared by the [place] FDA and has been authorized for detection and/or diagnosis of SARS-CoV-2 by FDA under an Emergency Use Authorization (EUA).  This EUA will remain in effect (meaning this test can be used) for the duration of the COVID-19 declaration under Section 564(b)(1) of the Act, 21 U.S.C. section 360bbb-3(b)(1), unless the authorization is terminated or revoked sooner. Performed at [facility] [place], [address] [place], [place] [address]       Radiology Studies: No results found.      Scheduled Meds: . amLODipine  5 mg Oral Daily  . carvedilol  25 mg Oral BID WC  . enoxaparin (LOVENOX) injection  40 mg Subcutaneous Q24H  . folic acid  1 mg Oral Daily  . gabapentin  300 mg Oral TID  . LORazepam  0-4 mg Intravenous Q12H   Or  . LORazepam  0-4 mg Oral Q12H  . nicotine  21 mg Transdermal Daily  . norethindrone  1 tablet Oral Daily  .  pantoprazole  40 mg Oral BID AC  . pneumococcal 23 valent vaccine  0.5 mL Intramuscular Tomorrow-1000  . thiamine  100 mg Oral Daily   Continuous Infusions:  Assessment & Plan:    1.  Severe alcohol dependence/alcohol withdrawal: She has not required Ativan in the last 24 to 48 hours.  Will DC CIWA protocol.  She has been started on Neurontin with plan to taper off after 1 to 2 months.  Patient informed that she will be discharged on the same if okay with accepting facility.  Will reduce dosage and monitor overnight.  Continue thiamine/folate.  2.  Syncope: Work-up so far negative including CT/echo.  Not sure if she was orthostatic.  She was hyponatremic on presentation which improved with IV hydration, indicating dehydration on presentation.    3.  Hyponatremia: In the setting of HCTZ use and alcohol abuse.  Likely a combination of beer portal mania and diuretic induced renal loss.  Now resolved.  Off diuretics.  Off SSRI  4.  Hypertension: Patient's blood pressure has been on the low end over the last couple of days.  Her medications have been adjusted.  Amlodipine dose has been reduced and she is now off losartan/HCTZ.  She remains on Coreg.  Check orthostatics in a.m.  If positive will DC Norvasc.  5.  Alcoholic hepatitis: Transaminase levels improving.  6.  Alcoholic gastritis: Improved with PPI.  Tolerating diet well  7.  Depression/anxiety: Off SSRI and now on Neurontin.  DVT prophylaxis: Lovenox Code Status: Full code Family / Patient Communication: Discussed with patient. Disposition Plan: Case management/social worker to follow-up in a.m.  Patient states states she was able to get in touch with [facility] who stated she could come tomorrow as they have a bed available but will need to be accepted after the review medical records (need to be faxed to them in the morning at fax number [phone].)     LOS: 2 days    Time spent: 35 minutes    Alessandra BevelsNeelima Abbey Veith, MD  [facility] Pager [phone]  If 7PM-7AM, please contact night-coverage [email or web address] Password [email or web address] 05/20/2019, 8:02 PM

## 2019-05-20 NOTE — Progress Notes (Signed)
Physical Therapy Treatment Patient Details Name: Michelle Myers MRN: [id number] DOB: 03/03/1976 Today's Date: 05/20/2019    History of Present Illness Michelle Myers is a 43 y.o. female with medical history significant of alcohol abuse, tobacco abuse, IBS, hypertension, depression, who presents with abdominal pain, syncope.    PT Comments    Pt eager to go for walk with therapy today, however is anxious about increase in dizziness with ambulation to bathroom earlier today. Pt able to mobilize without increase in dizziness this afternoon. Pt is supervision for bed mobility and transfers and able to progress from min guard to supervision ambulating with both RW and without assistive device. Pt reports feeling more secure with RW. Pt educated on need for frequent mobility with increasing duration when she returns home until she feels more stable. Pt reports living in 2 story home with steps to get in. PT will follow back tomorrow for stair training prior to d/c.     Follow Up Recommendations  No PT follow up     Equipment Recommendations  Rolling walker with 5" wheels    Recommendations for Other Services Other (comment)(SW for resources and support to quit drinking)     Precautions / Restrictions Precautions Precautions: Fall Restrictions Weight Bearing Restrictions: No    Mobility  Bed Mobility Overal bed mobility: Needs Assistance Bed Mobility: Supine to Sit     Supine to sit: Supervision     General bed mobility comments: Supervision for safety  Transfers Overall transfer level: Needs assistance Equipment used: None Transfers: Sit to/from Stand Sit to Stand: Supervision         General transfer comment: Supervision for safety; noted slight unsteadiness  Ambulation/Gait Ambulation/Gait assistance: Min guard;Supervision Gait Distance (Feet): 650 Feet Assistive device: None;Rolling walker (2 wheeled) Gait Pattern/deviations: Step-through pattern;Decreased step  length - right;Decreased step length - left;Decreased weight shift to left;Narrow base of support Gait velocity: slowed Gait velocity interpretation: 1.31 - 2.62 ft/sec, indicative of limited community ambulator General Gait Details: min guard progressing to supervision for ambulation of 300 feet with RW, pt reports dizziness with ambulation to bathroom earlier in day, however does not report dizziness with gait this afternoon, pt with increased weightbearing through R LE cues for proximity to RW and for increased weightshift to L , pt requests to further ambulate after return to room. pt able to ambulate without AD again progressing from min guard to supervision over duration of ambulation         Balance Overall balance assessment: Needs assistance   Sitting balance-Leahy Scale: Good     Standing balance support: During functional activity Standing balance-Leahy Scale: Fair                              Cognition Arousal/Alertness: Awake/alert Behavior During Therapy: Anxious Overall Cognitive Status: Impaired/Different from baseline Area of Impairment: Safety/judgement;Awareness                         Safety/Judgement: Decreased awareness of safety;Decreased awareness of deficits Awareness: Emergent   General Comments: anxious about ability to go home and take care of children      Exercises      General Comments General comments (skin integrity, edema, etc.): Pt reports dizziness with ambulation to bathroom earlier today, educated pt on taking her time with positional changes until she feels more steady, pt verbalizes understanding.      Pertinent  Vitals/Pain Pain Assessment: No/denies pain           PT Goals (current goals can now be found in the care plan section) Acute Rehab PT Goals Patient Stated Goal: expressed desire to quit drinking PT Goal Formulation: With patient Time For Goal Achievement: 06/02/19 Potential to Achieve Goals:  Good Progress towards PT goals: Progressing toward goals    Frequency    Min 3X/week      PT Plan Current plan remains appropriate       AM-PAC PT "6 Clicks" Mobility   Outcome Measure  Help needed turning from your back to your side while in a flat bed without using bedrails?: None Help needed moving from lying on your back to sitting on the side of a flat bed without using bedrails?: None Help needed moving to and from a bed to a chair (including a wheelchair)?: None Help needed standing up from a chair using your arms (e.g., wheelchair or bedside chair)?: A Little Help needed to walk in hospital room?: A Little Help needed climbing 3-5 steps with a railing? : A Little 6 Click Score: 21    End of Session Equipment Utilized During Treatment: Gait belt Activity Tolerance: Patient tolerated treatment well Patient left: in bed;with call bell/phone within reach Nurse Communication: Mobility status PT Visit Diagnosis: Unsteadiness on feet (R26.81)     Time: [id number] PT Time Calculation (min) (ACUTE ONLY): 23 min  Charges:  $Gait Training: 23-37 mins                     Mariabella Nilsen B. Beverely RisenVan Fleet PT, DPT Acute Rehabilitation Services Pager [phone] Office [phone]    Elon Alaslizabeth B Van Fleet 05/20/2019, 3:37 PM

## 2019-05-20 NOTE — Progress Notes (Signed)
CM met with the patient early am and provided her a list of alcohol residential treatment programs. CM encouraged her to call the different facilities and try and find a bed in one. CM check in on the patient at 5:30 pm and she had not called any of the facilities. Pt will need to find a space at one of the facilities on her own if she desires inpatient treatment. She is aware of this.  TOC to follow up with her tomorrow.

## 2019-05-21 DIAGNOSIS — K2920 Alcoholic gastritis without bleeding: Secondary | ICD-10-CM

## 2019-05-21 MED ORDER — THIAMINE HCL 100 MG PO TABS: 100.0000 mg | ORAL_TABLET | Freq: Every day | ORAL | 0 refills | Status: AC

## 2019-05-21 MED ORDER — GABAPENTIN 100 MG PO CAPS: 200.0000 mg | ORAL_CAPSULE | Freq: Three times a day (TID) | ORAL | 0 refills | Status: DC

## 2019-05-21 MED ORDER — SERTRALINE HCL 100 MG PO TABS: 50.0000 mg | ORAL_TABLET | Freq: Every day | ORAL | 1 refills | Status: DC

## 2019-05-21 MED ORDER — PANTOPRAZOLE SODIUM 40 MG PO TBEC: 40.0000 mg | DELAYED_RELEASE_TABLET | Freq: Every day | ORAL | 0 refills | Status: DC

## 2019-05-21 MED ORDER — HYDROXYZINE HCL 10 MG PO TABS: 10.0000 mg | ORAL_TABLET | Freq: Three times a day (TID) | ORAL | 0 refills | Status: DC | PRN

## 2019-05-21 MED ORDER — BUTALBITAL-APAP-CAFFEINE 50-325-40 MG PO TABS
1.0000 | ORAL_TABLET | Freq: Four times a day (QID) | ORAL | Status: DC | PRN
  Administered 2019-05-21: 11:00:00 1 via ORAL
  Filled 2019-05-21: qty 1

## 2019-05-21 MED ORDER — FOLIC ACID 1 MG PO TABS: 1.0000 mg | ORAL_TABLET | Freq: Every day | ORAL | 1 refills | Status: DC

## 2019-05-21 MED ORDER — AMLODIPINE BESYLATE 10 MG PO TABS: 5.0000 mg | ORAL_TABLET | Freq: Every day | ORAL | 1 refills | Status: DC

## 2019-05-21 NOTE — Discharge Summary (Signed)
Physician Discharge Summary  Michelle Myers WNU:[id number] DOB: [birth year] DOA: 05/17/2019  PCP: Jeoffrey MassedMcGowen, Philip H, MD  Admit date: 05/17/2019 Discharge date: 05/21/2019 Consultations: None Admitted From: home Disposition: home -[facility] in am   Discharge Diagnoses:  Principal Problem:   Hyponatremia Active Problems:   TOBACCO USE   Essential hypertension   ETOH abuse   Syncope   Abnormal LFTs   Abdominal pain   Depression   Hypomagnesemia   Hospital Course Summary: 43 y.o.Fwith HTN and alcohol use disorder who presents with progressive epigastric pain and syncope. She admits to alcohol dependence and worsening symptoms with increased drinking lately.  She states her alcohol use pattern has been incremental over the last 10 years. She is a stay-at-home mom, and was hiding boxes of wine in her closet and surreptitiously drinking them when her husband was at work.  On the morning of admission, she passed out in the kitchen and her husband brought her to the ER.Work-up here revealed hyponatremia with sodium at 119.  Patient admitted to medical service with CIWA protocol and started on IV fluids.   1.  Severe alcohol dependence/alcohol withdrawal: She has not required Ativan in the last 24 to 48 hours.  Will DC CIWA protocol.  She has been started on Neurontin with plan to taper off after 1 to 2 months.  Patient informed that she will be discharged on the same if okay with accepting facility.  Will reduce dosage and monitor overnight.  Continue thiamine/folate.Patient motivated to go to inpatient substance abuse center and discussed with social worker who gave her a list of places.  Patient was able to get in touch with [facility] accepted her and she could go there tomorrow at 11am and patient wishes to go home today and get her belongings. Per CM/SW okay to discharge today.   2.  Syncope: Work-up so far negative including CT/echo.  Not sure if she was orthostatic at home.   She was hyponatremic on presentation which improved with IV hydration, indicating dehydration on presentation.    3.  Hyponatremia: In the setting of HCTZ use and alcohol abuse.  Likely a combination of beer portal mania and diuretic induced renal loss.  Now resolved.  Off diuretics. SSRI held on admission but resumed at lower dose now as unlikely to be medication induced SIADH given improvement with hydration.   4.  Hypertension: Patient's blood pressure was noted to be on the low end over the last couple of days during the hospital course.  Her medications have been adjusted.  Amlodipine dose has been reduced and she is now off losartan/HCTZ.  She remains on Coreg. Dehydration/Hypotension at home could have contributed to #2.   5. Mild Alcoholic hepatitis: Transaminase levels slightly up (70 -85) . F/U PCP for repeat labs once sober and review of home med list for adjustments if LFTs still elevated.   6.  Alcoholic gastritis: Improved with PPI.  Tolerating diet well. Will discharge with 30 day prescription  7.  Depression/anxiety: SSRI held on admission in concern for # 3 and 5, now resumed at lower dose (improvement of hyponatremia with IV hydration indicates more of diuretic induced dehydration rather than SSRI induced SIADH)  Also on Neurontin/ low dose hydroxyzine for PRN anxiety use.     Discharge Exam:  Vitals:   05/21/19 0556 05/21/19 1305  BP: 99/71 (!) 121/94  Pulse: 77 83  Resp: 18   Temp: 98.9 F (37.2 C) 98.2 F (36.8 C)  SpO2: 97% 97%   Vitals:   05/20/19 1858 05/20/19 2352 05/21/19 0556 05/21/19 1305  BP: 126/89 (!) 150/95 99/71 (!) 121/94  Pulse: 78 70 77 83  Resp: 20 16 18    Temp: 98.4 F (36.9 C) 98.2 F (36.8 C) 98.9 F (37.2 C) 98.2 F (36.8 C)  TempSrc: Oral Oral Oral Oral  SpO2: 99% 100% 97% 97%  Weight:   70.8 kg   Height:        General: Pt is alert, awake, not in acute distress Cardiovascular: RRR, S1/S2 +, no rubs, no gallops Respiratory: CTA  bilaterally, no wheezing, no rhonchi Abdominal: Soft, NT, ND, bowel sounds + Extremities: no edema, no cyanosis  Discharge Condition:Stable CODE STATUS: Diet recommendation: Recommendations for Outpatient Follow-up:  1. Follow up with PCP: 2 weeks 2. Follow up Substance abuse center [facility] in am 3. Please obtain follow up labs including: CMP in 2 weeks  Home Health services upon discharge:  Equipment/Devices upon discharge:   Discharge Instructions:  Discharge Instructions    Call MD for:  difficulty breathing, headache or visual disturbances   Complete by: As directed    Call MD for:  persistant dizziness or light-headedness   Complete by: As directed    Call MD for:  persistant nausea and vomiting   Complete by: As directed    Call MD for:  temperature >100.4   Complete by: As directed    Diet - low sodium heart healthy   Complete by: As directed    Increase activity slowly   Complete by: As directed      Allergies as of 05/21/2019      Reactions   Lisinopril    Angioedema   Epinephrine Other (See Comments)   Tachycardia, dizziness   Sulfa Antibiotics     ? reaction      Medication List    STOP taking these medications   hydrochlorothiazide 25 MG tablet Commonly known as: HYDRODIURIL   losartan 50 MG tablet Commonly known as: COZAAR   milk thistle 175 MG tablet   naproxen sodium 220 MG tablet Commonly known as: ALEVE     TAKE these medications   amLODipine 10 MG tablet Commonly known as: NORVASC Take 0.5 tablets (5 mg total) by mouth daily. What changed: how much to take   Camila 0.35 MG tablet Generic drug: norethindrone Take 1 tablet by mouth daily.   carvedilol 25 MG tablet Commonly known as: COREG TAKE 1 TABLET BY MOUTH 2 TIMES DAILY WITH A MEAL.   folic acid 1 MG tablet Commonly known as: FOLVITE Take 1 tablet (1 mg total) by mouth daily. Start taking on: May 22, 2019   gabapentin 100 MG capsule Commonly known as:  NEURONTIN Take 2 capsules (200 mg total) by mouth 3 (three) times daily.   hydrOXYzine 10 MG tablet Commonly known as: ATARAX/VISTARIL Take 1 tablet (10 mg total) by mouth 3 (three) times daily as needed for anxiety.   pantoprazole 40 MG tablet Commonly known as: PROTONIX Take 1 tablet (40 mg total) by mouth daily.   sertraline 100 MG tablet Commonly known as: ZOLOFT TAKE 1 TABLET BY MOUTH AT BEDTIME   thiamine 100 MG tablet Take 1 tablet (100 mg total) by mouth daily. Start taking on: May 22, 2019       Allergies  Allergen Reactions  . Lisinopril     Angioedema  . Epinephrine Other (See Comments)    Tachycardia, dizziness  . Sulfa Antibiotics      ?  reaction      The results of significant diagnostics from this hospitalization (including imaging, microbiology, ancillary and laboratory) are listed below for reference.    Labs: BNP (last 3 results) No results for input(s): BNP in the last 8760 hours. Basic Metabolic Panel: Recent Labs  Lab 05/17/19 2310 05/18/19 0852 05/18/19 1531 05/18/19 2128 05/19/19 0515 05/20/19 0351  NA 119* 131* 130* 131* 135 136  K 3.8 3.6 3.9 4.6 3.8 4.3  CL 81* 93* 93* 95* 99 103  CO2 20* 25 25 25 25 25   GLUCOSE 104* 97 146* 108* 91 114*  BUN 8 6 9 7  <5* <5*  CREATININE 0.47 0.65 0.75 0.71 0.58 0.69  CALCIUM 9.3 9.4 9.3 9.1 9.0 8.8*  MG 1.3* 2.1  --   --  1.8  --    Liver Function Tests: Recent Labs  Lab 05/17/19 2310 05/18/19 0852 05/19/19 0515 05/20/19 0351  AST 169* 114* 74* 75*  ALT 129* 105* 86* 79*  ALKPHOS 54 47 46 48  BILITOT 1.0 1.2 0.8 0.5  PROT 7.9 6.7 6.6 6.1*  ALBUMIN 4.7 3.9 3.7 3.5   Recent Labs  Lab 05/18/19 0852  LIPASE 29   No results for input(s): AMMONIA in the last 168 hours. CBC: Recent Labs  Lab 05/17/19 2310 05/18/19 0852 05/19/19 0515 05/20/19 0351  WBC 7.1 5.1 5.4 5.4  NEUTROABS 5.0 3.0  --   --   HGB 13.0 12.4 12.3 12.0  HCT 35.7* 33.8* 35.3* 35.2*  MCV 94.9 95.5 100.3*  102.3*  PLT 226 220 217 211   Cardiac Enzymes: No results for input(s): CKTOTAL, CKMB, CKMBINDEX, TROPONINI in the last 168 hours. BNP: Invalid input(s): POCBNP CBG: No results for input(s): GLUCAP in the last 168 hours. D-Dimer No results for input(s): DDIMER in the last 72 hours. Hgb A1c No results for input(s): HGBA1C in the last 72 hours. Lipid Profile No results for input(s): CHOL, HDL, LDLCALC, TRIG, CHOLHDL, LDLDIRECT in the last 72 hours. Thyroid function studies No results for input(s): TSH, T4TOTAL, T3FREE, THYROIDAB in the last 72 hours.  Invalid input(s): FREET3 Anemia work up No results for input(s): VITAMINB12, FOLATE, FERRITIN, TIBC, IRON, RETICCTPCT in the last 72 hours. Urinalysis    Component Value Date/Time   COLORURINE YELLOW 05/17/2019 2033   APPEARANCEUR CLEAR 05/17/2019 2033   LABSPEC 1.010 05/17/2019 2033   PHURINE 6.0 05/17/2019 2033   GLUCOSEU NEGATIVE 05/17/2019 2033   HGBUR NEGATIVE 05/17/2019 2033   BILIRUBINUR NEGATIVE 05/17/2019 2033   BILIRUBINUR negative 06/05/2016 1035   KETONESUR 15 (A) 05/17/2019 2033   PROTEINUR NEGATIVE 05/17/2019 2033   UROBILINOGEN 4.0 06/05/2016 1035   NITRITE NEGATIVE 05/17/2019 2033   LEUKOCYTESUR NEGATIVE 05/17/2019 2033   Sepsis Labs Invalid input(s): PROCALCITONIN,  WBC,  LACTICIDVEN Microbiology Recent Results (from the past 240 hour(s))  SARS Coronavirus 2 [facility](Hospital order, Performed [facility]) Nasopharyngeal Nasopharyngeal Swab     Status: None   Collection Time: 05/18/19 12:20 AM   Specimen: Nasopharyngeal Swab  Result Value Ref Range Status   SARS Coronavirus 2 NEGATIVE NEGATIVE Final    Comment: (NOTE) If result is NEGATIVE SARS-CoV-2 target nucleic acids are NOT DETECTED. The SARS-CoV-2 RNA is generally detectable in upper and lower  respiratory specimens during the acute phase of infection. The lowest  concentration of SARS-CoV-2 viral copies this assay can detect is 250   copies / mL. A negative result does not preclude SARS-CoV-2 infection  and should not be used  as the sole basis for treatment or other  patient management decisions.  A negative result may occur with  improper specimen collection / handling, submission of specimen other  than nasopharyngeal swab, presence of viral mutation(s) within the  areas targeted by this assay, and inadequate number of viral copies  (<250 copies / mL). A negative result must be combined with clinical  observations, patient history, and epidemiological information. If result is POSITIVE SARS-CoV-2 target nucleic acids are DETECTED. The SARS-CoV-2 RNA is generally detectable in upper and lower  respiratory specimens dur ing the acute phase of infection.  Positive  results are indicative of active infection with SARS-CoV-2.  Clinical  correlation with patient history and other diagnostic information is  necessary to determine patient infection status.  Positive results do  not rule out bacterial infection or co-infection with other viruses. If result is PRESUMPTIVE POSTIVE SARS-CoV-2 nucleic acids MAY BE PRESENT.   A presumptive positive result was obtained on the submitted specimen  and confirmed on repeat testing.  While 2019 novel coronavirus  (SARS-CoV-2) nucleic acids may be present in the submitted sample  additional confirmatory testing may be necessary for epidemiological  and / or clinical management purposes  to differentiate between  SARS-CoV-2 and other Sarbecovirus currently known to infect humans.  If clinically indicated additional testing with an alternate test  methodology [phone] is advised. The SARS-CoV-2 RNA is generally  detectable in upper and lower respiratory sp ecimens during the acute  phase of infection. The expected result is Negative. Fact Sheet for Patients:  BoilerBrush.com.cyhttps://www.fda.gov/media/136312/download Fact Sheet for Healthcare  Providers: https://pope.com/https://www.fda.gov/media/136313/download This test is not yet approved or cleared by the [place] FDA and has been authorized for detection and/or diagnosis of SARS-CoV-2 by FDA under an Emergency Use Authorization (EUA).  This EUA will remain in effect (meaning this test can be used) for the duration of the COVID-19 declaration under Section 564(b)(1) of the Act, 21 U.S.C. section 360bbb-3(b)(1), unless the authorization is terminated or revoked sooner. Performed at [facility], [address] [place], [place] [address]     Procedures/Studies: Ct Head Wo Contrast  Result Date: 05/18/2019 CLINICAL DATA:  Syncopal episode several days ago with persistent headaches, initial encounter EXAM: CT HEAD WITHOUT CONTRAST TECHNIQUE: Contiguous axial images were obtained from the base of the skull through the vertex without intravenous contrast. COMPARISON:  None. FINDINGS: Brain: No evidence of acute infarction, hemorrhage, hydrocephalus, extra-axial collection or mass lesion/mass effect. Vascular: No hyperdense vessel or unexpected calcification. Skull: Normal. Negative for fracture or focal lesion. Sinuses/Orbits: No acute finding. Other: None. IMPRESSION: Normal head CT. Electronically Signed   By: Alcide CleverMark  Lukens M.D.   On: 05/18/2019 01:46   [place] Carotid  Result Date: 05/18/2019 Carotid Arterial Duplex Study Indications:  Syncope. Risk Factors: Hypertension, current smoker. Performing Technologist: Gertie FeyMichelle Simonetti MHA, RDMS, RVT, RDCS  Examination Guidelines: A complete evaluation includes B-mode imaging, spectral Doppler, color Doppler, and power Doppler as needed of all accessible portions of each vessel. Bilateral testing is considered an integral part of a complete examination. Limited examinations for reoccurring indications may be performed as noted.  Right Carotid Findings: +----------+--------+--------+--------+-----------------------+--------+           PSV  cm/sEDV cm/sStenosisDescribe               Comments +----------+--------+--------+--------+-----------------------+--------+ CCA Prox  64      17                                              +----------+--------+--------+--------+-----------------------+--------+  CCA Distal66      16                                              +----------+--------+--------+--------+-----------------------+--------+ ICA Prox  39      18              smooth and heterogenous         +----------+--------+--------+--------+-----------------------+--------+ ICA Distal72      33                                              +----------+--------+--------+--------+-----------------------+--------+ ECA       73      24                                              +----------+--------+--------+--------+-----------------------+--------+ +----------+--------+-------+----------------+-------------------+           PSV cm/sEDV cmsDescribe        Arm Pressure (mmHG) +----------+--------+-------+----------------+-------------------+ [id number]             Multiphasic, WNL                    +----------+--------+-------+----------------+-------------------+ +---------+--------+--+--------+--+---------+ VertebralPSV cm/s38EDV cm/s15Antegrade +---------+--------+--+--------+--+---------+  Left Carotid Findings: +----------+--------+--------+--------+--------+--------+           PSV cm/sEDV cm/sStenosisDescribeComments +----------+--------+--------+--------+--------+--------+ CCA Prox  74      19                               +----------+--------+--------+--------+--------+--------+ CCA Distal73      21                               +----------+--------+--------+--------+--------+--------+ ICA Prox  63      16                               +----------+--------+--------+--------+--------+--------+ ICA Distal54      24                                +----------+--------+--------+--------+--------+--------+ ECA       54      13                               +----------+--------+--------+--------+--------+--------+ +----------+--------+--------+----------------+-------------------+ SubclavianPSV cm/sEDV cm/sDescribe        Arm Pressure (mmHG) +----------+--------+--------+----------------+-------------------+           88              Multiphasic, WNL                    +----------+--------+--------+----------------+-------------------+ +---------+--------+--+--------+--+---------+ VertebralPSV cm/s42EDV cm/s18Antegrade +---------+--------+--+--------+--+---------+  Summary: Right Carotid: Velocities in the right ICA are consistent with a 1-39% stenosis. Left Carotid: Velocities in the left ICA are consistent with a 1-39% stenosis. Vertebrals:  Bilateral vertebral arteries demonstrate antegrade flow. Subclavians: Normal flow hemodynamics were  seen in bilateral subclavian              arteries. *See table(s) above for measurements and observations.  Electronically signed by Waverly Ferrari MD on 05/18/2019 at 4:54:27 PM.    Final      Time coordinating discharge: Over 30 minutes  SIGNED:   Alessandra Bevels, MD  [facility] 05/21/2019, 3:44 PM Pager : [phone]

## 2019-05-21 NOTE — Care Management (Signed)
1541 05-21-19 Case Manager spoke with [facility] Admissions Coordinator at [facility] and they can accept patient back on Neurontin and  Hydroxyzine. MD aware to place new medications on the d/c summary. No further needs from CM at this time. Bethena Roys, RN,BSN Case Manager [phone]

## 2019-05-21 NOTE — Progress Notes (Signed)
Physical Therapy Treatment and Discharge Patient Details Name: Michelle Myers MRN: [id number] DOB: December 01, 1975 Today's Date: 05/21/2019    History of Present Illness Michelle Myers is a 43 y.o. female with medical history significant of alcohol abuse, tobacco abuse, IBS, hypertension, depression, who presents with abdominal pain, syncope.    PT Comments    Pt cognition and mobility greatly improved today. Pt independent with ambulation and mod I with ascent/descent of flight of stairs. Pt with 2/4 DoE with stair training secondary to deconditioning. Pt reports wanting to get back to exercise. Pt has met goals and has completed PT education so discharging from PT services. Pt scheduled to admit her self to alcohol rehab this afternoon.    Follow Up Recommendations  No PT follow up     Equipment Recommendations  None recommended by PT    Recommendations for Other Services Other (comment)(SW for resources and support to quit drinking)     Precautions / Restrictions Precautions Precautions: Fall Restrictions Weight Bearing Restrictions: No    Mobility  Bed Mobility               General bed mobility comments: sitting up EoB on entry   Transfers Overall transfer level: Independent                  Ambulation/Gait Ambulation/Gait assistance: Independent Gait Distance (Feet): 350 Feet Assistive device: None           Stairs Stairs: Yes Stairs assistance: Modified independent (Device/Increase time) Stair Management: One rail Left;Alternating pattern;Forwards Number of Stairs: 12 General stair comments: increased time and slight SoB, but steady       Balance Overall balance assessment: Needs assistance   Sitting balance-Leahy Scale: Good     Standing balance support: During functional activity Standing balance-Leahy Scale: Good                              Cognition Arousal/Alertness: Awake/alert Behavior During Therapy: WFL for  tasks assessed/performed Overall Cognitive Status: Within Functional Limits for tasks assessed                                           General Comments General comments (skin integrity, edema, etc.): Pt is optimistic about inpatient alcohol dependence rehab      Pertinent Vitals/Pain Pain Assessment: No/denies pain           PT Goals (current goals can now be found in the care plan section) Acute Rehab PT Goals Patient Stated Goal: expressed desire to quit drinking PT Goal Formulation: With patient Time For Goal Achievement: 06/02/19 Potential to Achieve Goals: Good Progress towards PT goals: Goals met/education completed, patient discharged from PT    Frequency    Min 3X/week      PT Plan Current plan remains appropriate       AM-PAC PT "6 Clicks" Mobility   Outcome Measure  Help needed turning from your back to your side while in a flat bed without using bedrails?: None Help needed moving from lying on your back to sitting on the side of a flat bed without using bedrails?: None Help needed moving to and from a bed to a chair (including a wheelchair)?: None Help needed standing up from a chair using your arms (e.g., wheelchair or bedside chair)?: A Little Help needed  to walk in hospital room?: A Little Help needed climbing 3-5 steps with a railing? : A Little 6 Click Score: 21    End of Session Equipment Utilized During Treatment: Gait belt Activity Tolerance: Patient tolerated treatment well Patient left: in bed;with call bell/phone within reach Nurse Communication: Mobility status PT Visit Diagnosis: Unsteadiness on feet (R26.81)     Time: 5885-0277 PT Time Calculation (min) (ACUTE ONLY): 12 min  Charges:  $Gait Training: 8-22 mins                     Mordche Hedglin B. Migdalia Dk PT, DPT Acute Rehabilitation Services Pager [phone] Office [phone]    [place] 05/21/2019, 4:19 PM

## 2019-05-21 NOTE — Progress Notes (Signed)
CSW faxed over medical records to [facility] treatment facility. CSW is awaiting confirmation that they have a treatment bed for her.   CSW will continue to follow.   Domenic Schwab, MSW, [place] Worker [facility]  [phone]

## 2019-05-27 ENCOUNTER — Other Ambulatory Visit: Payer: Self-pay | Admitting: Family Medicine

## 2019-05-27 NOTE — Telephone Encounter (Signed)
Called patient and lvm, okay per DPR. We rcvd a refill request for hydrochlorothiazide. I saw that the medication was discontinued by a ED provider. Patient has not f/u since being released from the hospital on 8/7. I'm not comfortable filling the medication and wanted to forward you this message. Thank you

## 2019-07-15 ENCOUNTER — Other Ambulatory Visit: Payer: Self-pay | Admitting: Family Medicine

## 2019-07-16 DIAGNOSIS — Z23 Encounter for immunization: Secondary | ICD-10-CM | POA: Diagnosis not present

## 2019-07-28 ENCOUNTER — Other Ambulatory Visit: Payer: Self-pay

## 2019-07-29 ENCOUNTER — Ambulatory Visit: Payer: BC Managed Care – PPO | Admitting: Family Medicine

## 2019-07-29 ENCOUNTER — Encounter: Payer: Self-pay | Admitting: Family Medicine

## 2019-07-29 VITALS — BP 100/60 | HR 72 | Temp 98.2°F | Resp 16 | Ht 65.0 in | Wt 161.2 lb

## 2019-07-29 DIAGNOSIS — F1021 Alcohol dependence, in remission: Secondary | ICD-10-CM

## 2019-07-29 DIAGNOSIS — R7401 Elevation of levels of liver transaminase levels: Secondary | ICD-10-CM | POA: Diagnosis not present

## 2019-07-29 DIAGNOSIS — K2950 Unspecified chronic gastritis without bleeding: Secondary | ICD-10-CM

## 2019-07-29 DIAGNOSIS — R7989 Other specified abnormal findings of blood chemistry: Secondary | ICD-10-CM | POA: Diagnosis not present

## 2019-07-29 DIAGNOSIS — I10 Essential (primary) hypertension: Secondary | ICD-10-CM

## 2019-07-29 LAB — CBC WITH DIFFERENTIAL/PLATELET
Basophils Absolute: 0 10*3/uL (ref 0.0–0.1)
Basophils Relative: 0.3 % (ref 0.0–3.0)
Eosinophils Absolute: 0.3 10*3/uL (ref 0.0–0.7)
Eosinophils Relative: 2.6 % (ref 0.0–5.0)
HCT: 42.2 % (ref 36.0–46.0)
Hemoglobin: 14.1 g/dL (ref 12.0–15.0)
Lymphocytes Relative: 18.7 % (ref 12.0–46.0)
Lymphs Abs: 2.1 10*3/uL (ref 0.7–4.0)
MCHC: 33.5 g/dL (ref 30.0–36.0)
MCV: 97.6 fl (ref 78.0–100.0)
Monocytes Absolute: 0.7 10*3/uL (ref 0.1–1.0)
Monocytes Relative: 6.2 % (ref 3.0–12.0)
Neutro Abs: 8.3 10*3/uL — ABNORMAL HIGH (ref 1.4–7.7)
Neutrophils Relative %: 72.2 % (ref 43.0–77.0)
Platelets: 342 10*3/uL (ref 150.0–400.0)
RBC: 4.33 Mil/uL (ref 3.87–5.11)
RDW: 12.2 % (ref 11.5–15.5)
WBC: 11.5 10*3/uL — ABNORMAL HIGH (ref 4.0–10.5)

## 2019-07-29 LAB — COMPREHENSIVE METABOLIC PANEL
ALT: 8 U/L (ref 0–35)
AST: 12 U/L (ref 0–37)
Albumin: 4.5 g/dL (ref 3.5–5.2)
Alkaline Phosphatase: 63 U/L (ref 39–117)
BUN: 13 mg/dL (ref 6–23)
CO2: 29 mEq/L (ref 19–32)
Calcium: 9.9 mg/dL (ref 8.4–10.5)
Chloride: 100 mEq/L (ref 96–112)
Creatinine, Ser: 0.65 mg/dL (ref 0.40–1.20)
GFR: 99.3 mL/min (ref >60.00)
Glucose, Bld: 99 mg/dL (ref 70–99)
Potassium: 4.5 mEq/L (ref 3.5–5.1)
Sodium: 135 mEq/L (ref 135–145)
Total Bilirubin: 0.3 mg/dL (ref 0.2–1.2)
Total Protein: 7.5 g/dL (ref 6.0–8.3)

## 2019-07-29 LAB — TSH: TSH: 1.75 u[IU]/mL (ref 0.35–4.50)

## 2019-07-29 LAB — T4, FREE: Free T4: 0.69 ng/dL (ref 0.60–1.60)

## 2019-07-29 MED ORDER — TRAZODONE HCL 100 MG PO TABS: 100.0000 mg | ORAL_TABLET | Freq: Every evening | ORAL | 1 refills | Status: DC | PRN

## 2019-07-29 MED ORDER — HYDROXYZINE HCL 10 MG PO TABS: 10.0000 mg | ORAL_TABLET | Freq: Three times a day (TID) | ORAL | 6 refills | Status: DC | PRN

## 2019-07-29 MED ORDER — PANTOPRAZOLE SODIUM 40 MG PO TBEC: 40.0000 mg | DELAYED_RELEASE_TABLET | Freq: Every day | ORAL | 1 refills | Status: DC

## 2019-07-29 NOTE — Patient Instructions (Signed)
Avoid ibuprofen, aleve, aspirin--basically ALL over the counter meds for pain EXCEPT tylenol (you can take [phone] mg of tylenol 1-2 times per day if necessary for pain).

## 2019-07-29 NOTE — Progress Notes (Signed)
OFFICE VISIT  07/29/2019   CC:  Chief Complaint  Patient presents with  . Stomach acid    abd discomfort, burping more often than usual   HPI:    Patient is a 43 y.o. Caucasian female who presents for "stomach acid". Has been going on about 5-6 mo, has hx of alcoholic gastritis.  This improved on PPI qd in hosp and after d/c.  She eventually stopped it in attempt to simplify med regimen, not realizing it was for dyspepsia/gastritis sx's.  Denies GERD.   Since getting off this about 10 d/a she notes recurrence of mid abd mild constant bothersome ache. No n/v/d or fevers.  Has started taking some advil, 200-400 mg about qod for about 2 mo. No otc meds tried for current abd sx's.   Pt admitted to hosp for ETOH w/drawal, etc 8/3-8/7, 2020.  Records reviewed.  D/c'd with plan to go to [facility] the next day. She did go.  Got out mid Sept. She is feeling much improved!  "It's not easy but I'm happy". She has not had alcohol since 05/18/19.  Past Medical History:  Diagnosis Date  . Alcoholism ([place])    Ongoing (many years) of abuse until 05/17/2019 admission for detox.  Marland Kitchen Anxiety   . Elevated transaminase level    likely secondary to alcohol.  Viral Hep screens normal.  . Hypertension   . Left ankle sprain 09/13/2017   [facility] Priority care: x-ray NORMAL 09/13/17.  . Tobacco dependence    ongoing as of 05/2019    Past Surgical History:  Procedure Laterality Date  . BREAST BIOPSY Right 07/2018   benign (fibroadenoma)  . CATARACT EXTRACTION W/ INTRAOCULAR LENS  IMPLANT, BILATERAL  2008 and 2010  . CERVICAL CONE BIOPSY     for HPV  . WISDOM TOOTH EXTRACTION      Outpatient Medications Prior to Visit  Medication Sig Dispense Refill  . amLODipine (NORVASC) 10 MG tablet Take 0.5 tablets (5 mg total) by mouth daily. 90 tablet 1  . buPROPion (WELLBUTRIN XL) 150 MG 24 hr tablet     . carvedilol (COREG) 25 MG tablet TAKE 1 TABLET BY MOUTH 2 TIMES DAILY WITH A MEAL. 180 tablet 0   . hydrochlorothiazide (HYDRODIURIL) 25 MG tablet     . naltrexone (DEPADE) 50 MG tablet Take 50 mg by mouth daily. Take 1 tablet by mouth at bedtime.    . norethindrone (CAMILA) 0.35 MG tablet Take 1 tablet by mouth daily.    . sertraline (ZOLOFT) 100 MG tablet Take 0.5 tablets (50 mg total) by mouth daily. TAKE 1 TABLET BY MOUTH AT BEDTIME 30 tablet 1  . hydrOXYzine (ATARAX/VISTARIL) 10 MG tablet Take 1 tablet (10 mg total) by mouth 3 (three) times daily as needed for anxiety. 30 tablet 0  . traZODone (DESYREL) 100 MG tablet     . folic acid (FOLVITE) 1 MG tablet Take 1 tablet (1 mg total) by mouth daily. (Patient not taking: Reported on 07/29/2019) 30 tablet 1  . gabapentin (NEURONTIN) 100 MG capsule Take 2 capsules (200 mg total) by mouth 3 (three) times daily. (Patient not taking: Reported on 07/29/2019) 180 capsule 0  . pantoprazole (PROTONIX) 40 MG tablet Take 1 tablet (40 mg total) by mouth daily. (Patient not taking: Reported on 07/29/2019) 30 tablet 0   No facility-administered medications prior to visit.     Allergies  Allergen Reactions  . Lisinopril     Angioedema  . Epinephrine Other (See Comments)  Tachycardia, dizziness  . Sulfa Antibiotics      ? reaction    ROS As per HPI  PE: Blood pressure 100/60, pulse 72, temperature 98.2 F (36.8 C), temperature source Temporal, resp. rate 16, height 5\' 5"  (1.651 m), weight 161 lb 3.2 oz (73.1 kg), SpO2 99 %. Exam chaperoned by , CMA. Gen: Alert, well appearing.  Patient is oriented to person, place, time, and situation. AFFECT: pleasant, lucid thought and speech. CV: RRR, no m/r/g.   LUNGS: CTA bilat, nonlabored resps, good aeration in all lung fields. ABD: soft, NT, ND, BS normal.  No hepatospenomegaly or mass.  No bruits. EXT: no clubbing or cyanosis.  no edema.    LABS:    Chemistry      Component Value Date/Time   NA 136 05/20/2019 0351   K 4.3 05/20/2019 0351   CL 103 05/20/2019 0351   CO2  25 05/20/2019 0351   BUN <5 (L) 05/20/2019 0351   CREATININE 0.69 05/20/2019 0351      Component Value Date/Time   CALCIUM 8.8 (L) 05/20/2019 0351   ALKPHOS 48 05/20/2019 0351   AST 75 (H) 05/20/2019 0351   ALT 79 (H) 05/20/2019 0351   BILITOT 0.5 05/20/2019 0351     Lab Results  Component Value Date   WBC 5.4 05/20/2019   HGB 12.0 05/20/2019   HCT 35.2 (L) 05/20/2019   MCV 102.3 (H) 05/20/2019   PLT 211 05/20/2019   Lab Results  Component Value Date   LIPASE 29 05/18/2019   Lab Results  Component Value Date   VITAMINB12 306 07/28/2013   Lab Results  Component Value Date   TSH 5.012 (H) 05/18/2019   IMPRESSION AND PLAN:  1) Acute-on-chronic gastritis.  Hx of ETOH gastritis but fortunately she has quit drinking. Some NSAID use so I advised her to stop this. Instructions: Restart pantoprazole 40mg  qd. Avoid ibuprofen, aleve, aspirin--basically ALL over the counter meds for pain EXCEPT tylenol (you can take [phone] mg of tylenol 1-2 times per day if necessary for pain).  2) Hx of alcoholic hepatitis, alcohol w/drawal->got admitted 05/2019 and detoxed, [facility], and is now continuing with intensive outpt therapy with Dr. with [facility] until 08/16/19.  She'll need to see how she'll transition to another psychiatrist b/c I will not be able to rx her naloxone.  3) HTN: The current medical regimen is effective;  continue present plan and medications. Has rare orthostatic dizziness that is brief/not problematic.  If it becomes more problematic then I'll cut back on bp med.  No changes for now.  Labs today:  CMET, TSH,T4/T3 (mild elev TSH while in hosp 05/2019), cbc  An After Visit Summary was printed and given to the patient.  FOLLOW UP: Return in about 4 weeks (around 08/26/2019) for f/u gastritis and discuss hips pain.  Signed:  06/2019, MD           07/29/2019

## 2019-07-30 LAB — T3: T3, Total: 135 ng/dL (ref 76–181)

## 2019-08-17 ENCOUNTER — Other Ambulatory Visit: Payer: Self-pay | Admitting: Family Medicine

## 2019-08-17 DIAGNOSIS — M7581 Other shoulder lesions, right shoulder: Secondary | ICD-10-CM | POA: Diagnosis not present

## 2019-09-03 ENCOUNTER — Other Ambulatory Visit: Payer: Self-pay | Admitting: Family Medicine

## 2019-09-04 ENCOUNTER — Other Ambulatory Visit: Payer: Self-pay | Admitting: Family Medicine

## 2019-09-06 ENCOUNTER — Other Ambulatory Visit: Payer: Self-pay

## 2019-09-14 ENCOUNTER — Telehealth: Payer: Self-pay | Admitting: Family Medicine

## 2019-09-14 MED ORDER — AMLODIPINE BESYLATE 10 MG PO TABS: 10.0000 mg | ORAL_TABLET | Freq: Every day | ORAL | 3 refills | Status: DC

## 2019-09-14 NOTE — Telephone Encounter (Signed)
Please contact patient about amLODipine (NORVASC) Rx. Patient had to get the prescription filled by one of the physicians at the treatment center. The physician at the treatment center changed the dosing. Patient needs a refill but would like to correct the dosing with Dr. Anitra Lauth.

## 2019-09-14 NOTE — Telephone Encounter (Signed)
Patient currently takes 1/2 tab of 10mg  that was given by ED MD, Dr.Kamnineni. Unsure if she feels 5mg  is not enough or should go back to 10mg . She had labs done on 07/29/19 and all normal.   LM for pt to return call regarding dosage.

## 2019-09-14 NOTE — Telephone Encounter (Signed)
Pt is out of BP medication. She was only given 15 tabs. No issues with BP but would like to go back to taking 10mg .   Okay for refill?

## 2019-09-14 NOTE — Telephone Encounter (Signed)
Sent in amlodipine 10mg : take one whole tab daily

## 2019-09-14 NOTE — Addendum Note (Signed)
Addended by: Tammi Sou on: 09/14/2019 09:10 PM   Modules accepted: Orders

## 2019-09-15 NOTE — Telephone Encounter (Signed)
Patient advised and voiced understanding.  

## 2019-09-26 ENCOUNTER — Other Ambulatory Visit: Payer: Self-pay | Admitting: Family Medicine

## 2019-10-07 ENCOUNTER — Other Ambulatory Visit: Payer: Self-pay | Admitting: Family Medicine

## 2019-10-15 DIAGNOSIS — M7581 Other shoulder lesions, right shoulder: Secondary | ICD-10-CM

## 2019-10-15 HISTORY — DX: Other shoulder lesions, right shoulder: M75.81

## 2019-10-16 ENCOUNTER — Other Ambulatory Visit: Payer: Self-pay | Admitting: Family Medicine

## 2019-10-18 NOTE — Telephone Encounter (Signed)
I'll RF 1 mo supply of both of these meds but she was supposed to f/u with me 1 mo after I saw her in October 2020. Needs f/u sometime w/in the next month (Virtual is fine)

## 2019-10-18 NOTE — Telephone Encounter (Signed)
RF request for bupropion and hydroxyzine. Last RX for hydroxyzine was 1015/20 # 30 x 6 RFS- Contacted pharmacy and she is filling this about every 11-15 days. Bupropion came from a [facility]?   Last OV 07/29/2019 No upcoming OV.  Please advise RFs

## 2019-10-19 NOTE — Telephone Encounter (Signed)
Patient aware.  Appt set for 10/20/19

## 2019-10-20 ENCOUNTER — Encounter: Payer: Self-pay | Admitting: Family Medicine

## 2019-10-20 ENCOUNTER — Ambulatory Visit (INDEPENDENT_AMBULATORY_CARE_PROVIDER_SITE_OTHER): Payer: BC Managed Care – PPO | Admitting: Family Medicine

## 2019-10-20 ENCOUNTER — Other Ambulatory Visit: Payer: Self-pay

## 2019-10-20 VITALS — Wt 183.5 lb

## 2019-10-20 DIAGNOSIS — F1021 Alcohol dependence, in remission: Secondary | ICD-10-CM

## 2019-10-20 DIAGNOSIS — R1013 Epigastric pain: Secondary | ICD-10-CM | POA: Diagnosis not present

## 2019-10-20 DIAGNOSIS — H538 Other visual disturbances: Secondary | ICD-10-CM

## 2019-10-20 DIAGNOSIS — I10 Essential (primary) hypertension: Secondary | ICD-10-CM | POA: Diagnosis not present

## 2019-10-20 DIAGNOSIS — Z9849 Cataract extraction status, unspecified eye: Secondary | ICD-10-CM

## 2019-10-20 DIAGNOSIS — K2920 Alcoholic gastritis without bleeding: Secondary | ICD-10-CM

## 2019-10-20 NOTE — Progress Notes (Signed)
Virtual Visit via Video Note  I connected with pt on 10/20/19 at  9:00 AM EST by a video enabled telemedicine application and verified that I am speaking with the correct person using two identifiers.  Location patient: home Location provider:work or home office Persons participating in the virtual visit: patient, provider  I discussed the limitations of evaluation and management by telemedicine and the availability of in person appointments. The patient expressed understanding and agreed to proceed.  Telemedicine visit is a necessity given the COVID-19 restrictions in place at the current time.  HPI: 44 y/o WF being seen today for f/u HTN and chronic gastritis. Hx of alcoholism for which she got rehab/detox this fall and at last f/u with me 07/29/19, the A/P was: "1) Acute-on-chronic gastritis.  Hx of ETOH gastritis but fortunately she has quit drinking. Some NSAID use so I advised her to stop this. Instructions: Restart pantoprazole 40mg  qd. Avoid ibuprofen, aleve, aspirin--basically ALL over the counter meds for pain EXCEPT tylenol (you can take [phone] mg of tylenol 1-2 times per day if necessary for pain).  2) Hx of alcoholic hepatitis, alcohol w/drawal->got admitted 05/2019 and detoxed, then [facility], and is now continuing with intensive outpt therapy with Dr. Pecola Lawless with [facility] until 08/16/19.  She'll need to see how she'll transition to another psychiatrist b/c I will not be able to rx her naloxone.  3) HTN: The current medical regimen is effective;  continue present plan and medications. Has rare orthostatic dizziness that is brief/not problematic.  If it becomes more problematic then I'll cut back on bp med.  No changes for now.  Labs today:  CMET, TSH,T4/T3 (mild elev TSH while in hosp 05/2019), cbc."  Interim hx: Labs after last visit were all normal.  Still sober.  Doing pretty well except suffering from agoraphobia more lately. Still getting  counselor and psychiatrist through [facility].  Home bp 120/80 or better but just started checking today b/c found home cuff. No more orthostatic dizziness.  No CP or SOB. Does note LL's/ankles swelling some, esp since getting on amlod: this is toerable and goes down overnight fine.  Still has occ dyspepsia but lasts only a few days at a time.  C/o L eye blurry vision for a month or two, waxes and wanes in intensity.  No eye pain.   Has hx of cataract surgery on each eye > decade ago.  ROS: no CP, no SOB, no wheezing, no cough, no dizziness, no HAs, no rashes, no melena/hematochezia.  No polyuria or polydipsia.  No myalgias or arthralgias.   Past Medical History:  Diagnosis Date  . Alcoholism ([place])    Ongoing (many years) of abuse until 05/17/2019 admission for detox.  Marland Kitchen Anxiety   . Elevated transaminase level    likely secondary to alcohol.  Viral Hep screens normal.  . Hypertension   . Left ankle sprain 09/13/2017   [place] Priority care: x-ray NORMAL 09/13/17.  . Tobacco dependence    ongoing as of 05/2019    Past Surgical History:  Procedure Laterality Date  . BREAST BIOPSY Right 07/2018   benign (fibroadenoma)  . CATARACT EXTRACTION W/ INTRAOCULAR LENS  IMPLANT, BILATERAL  2008 and 2010  . CERVICAL CONE BIOPSY     for HPV  . WISDOM TOOTH EXTRACTION      Family History  Problem Relation Age of Onset  . Prostate cancer Father   . Hypertension Father   . Brain cancer  Daughter        neurofibromatosis, optic neurogliomas  . Diabetes Maternal Uncle   . Heart disease Maternal Grandmother        CBAG in late 56s  . Hypertension Maternal Grandmother   . Heart attack Maternal Grandfather 60  . Arthritis Mother   . Hypertension Mother   . Alcohol abuse Brother        half brother, maternal  . Mental illness Paternal Grandmother   . Heart disease Paternal Grandfather   . Heart attack Brother 70       half brother, maternal  . Stroke Neg Hx   . Breast cancer Neg  Hx      Current Outpatient Medications:  .  amLODipine (NORVASC) 10 MG tablet, Take 1 tablet (10 mg total) by mouth daily., Disp: 90 tablet, Rfl: 3 .  buPROPion (WELLBUTRIN XL) 150 MG 24 hr tablet, TAKE 1 TABLET BY MOUTH IN THE MORNING, Disp: 30 tablet, Rfl: 0 .  carvedilol (COREG) 25 MG tablet, TAKE 1 TABLET BY MOUTH 2 TIMES DAILY WITH A MEAL., Disp: 180 tablet, Rfl: 0 .  hydrochlorothiazide (HYDRODIURIL) 25 MG tablet, TAKE 1 TABLET BY MOUTH EVERY DAY IN THE MORNING, Disp: 30 tablet, Rfl: 1 .  hydrOXYzine (ATARAX/VISTARIL) 10 MG tablet, TAKE 1 TABLET (10 MG TOTAL) BY MOUTH 3 (THREE) TIMES DAILY AS NEEDED FOR ANXIETY., Disp: 30 tablet, Rfl: 0 .  naltrexone (DEPADE) 50 MG tablet, Take 50 mg by mouth daily. Take 1 tablet by mouth at bedtime., Disp: , Rfl:  .  norethindrone (CAMILA) 0.35 MG tablet, Take 1 tablet by mouth daily., Disp: , Rfl:  .  pantoprazole (PROTONIX) 40 MG tablet, Take 1 tablet (40 mg total) by mouth daily., Disp: 90 tablet, Rfl: 1 .  sertraline (ZOLOFT) 100 MG tablet, Take 0.5 tablets (50 mg total) by mouth daily. TAKE 1 TABLET BY MOUTH AT BEDTIME, Disp: 30 tablet, Rfl: 1 .  traZODone (DESYREL) 100 MG tablet, Take 1 tablet (100 mg total) by mouth at bedtime as needed for sleep., Disp: 90 tablet, Rfl: 1  EXAM:  VITALS per patient if applicable: There were no vitals taken for this visit.   GENERAL: alert, oriented, appears well and in no acute distress  HEENT: atraumatic, conjunttiva clear, no obvious abnormalities on inspection of external nose and ears  NECK: normal movements of the head and neck  LUNGS: on inspection no signs of respiratory distress, breathing rate appears normal, no obvious gross SOB, gasping or wheezing  CV: no obvious cyanosis  MS: moves all visible extremities without noticeable abnormality  PSYCH/NEURO: pleasant and cooperative, no obvious depression or anxiety, speech and thought processing grossly intact  LABS: none today    Chemistry       Component Value Date/Time   NA 135 07/29/2019 1008   K 4.5 07/29/2019 1008   CL 100 07/29/2019 1008   CO2 29 07/29/2019 1008   BUN 13 07/29/2019 1008   CREATININE 0.65 07/29/2019 1008      Component Value Date/Time   CALCIUM 9.9 07/29/2019 1008   ALKPHOS 63 07/29/2019 1008   AST 12 07/29/2019 1008   ALT 8 07/29/2019 1008   BILITOT 0.3 07/29/2019 1008     Lab Results  Component Value Date   WBC 11.5 (H) 07/29/2019   HGB 14.1 07/29/2019   HCT 42.2 07/29/2019   MCV 97.6 07/29/2019   PLT 342.0 07/29/2019   Lab Results  Component Value Date   TSH 1.75 07/29/2019  T3TOTAL 135 07/29/2019    ASSESSMENT AND PLAN:  Discussed the following assessment and plan:  1) HTN: The current medical regimen is effective;  continue present plan and medications. Lytes/cr good 3 mo ago.  Having some LE edema that is partially attributable to amlodipine but nothing bad and wants to stay on med.  2) Alcoholic gastritis/dyspepsia: doing well on daily PPI plus NO ALCOHOL.  3) Alcoholism: remains abstinent.  Continue seeing counselor and psychiatrist through [facility]-->she'll need to ask them to help her transition to private psychiatrist in order to continue her naloxone.  I'll rx all her other current meds, though.  4) L eye blurry vision, subacute, hx of cataracts + surgery in each eye. Refer to ophthalmology for further eval.  -we discussed possible serious and likely etiologies, options for evaluation and workup, limitations of telemedicine visit vs in person visit, treatment, treatment risks and precautions. Pt prefers to treat via telemedicine empirically rather then risking or undertaking an in person visit at this moment. Patient agrees to seek prompt in person care if worsening, new symptoms arise, or if is not improving with treatment.   I discussed the assessment and treatment plan with the patient. The patient was provided an opportunity to ask questions and all were answered.  The patient agreed with the plan and demonstrated an understanding of the instructions.   The patient was advised to call back or seek an in-person evaluation if the symptoms worsen or if the condition fails to improve as anticipated.  F/u: 3 mo  Signed:  Santiago Bumpers, MD           10/20/2019

## 2019-10-22 ENCOUNTER — Other Ambulatory Visit: Payer: Self-pay | Admitting: Family Medicine

## 2019-11-03 DIAGNOSIS — Z961 Presence of intraocular lens: Secondary | ICD-10-CM | POA: Diagnosis not present

## 2019-11-15 DIAGNOSIS — L821 Other seborrheic keratosis: Secondary | ICD-10-CM | POA: Diagnosis not present

## 2019-11-15 DIAGNOSIS — D485 Neoplasm of uncertain behavior of skin: Secondary | ICD-10-CM | POA: Diagnosis not present

## 2019-11-15 DIAGNOSIS — Z1283 Encounter for screening for malignant neoplasm of skin: Secondary | ICD-10-CM | POA: Diagnosis not present

## 2019-11-15 DIAGNOSIS — D229 Melanocytic nevi, unspecified: Secondary | ICD-10-CM | POA: Diagnosis not present

## 2019-11-17 ENCOUNTER — Ambulatory Visit (INDEPENDENT_AMBULATORY_CARE_PROVIDER_SITE_OTHER): Payer: BC Managed Care – PPO | Admitting: Family Medicine

## 2019-11-17 ENCOUNTER — Encounter: Payer: Self-pay | Admitting: Family Medicine

## 2019-11-17 ENCOUNTER — Other Ambulatory Visit: Payer: Self-pay

## 2019-11-17 VITALS — BP 107/72 | HR 72 | Temp 98.3°F | Resp 16 | Ht 65.0 in | Wt 189.0 lb

## 2019-11-17 DIAGNOSIS — I10 Essential (primary) hypertension: Secondary | ICD-10-CM

## 2019-11-17 MED ORDER — SERTRALINE HCL 100 MG PO TABS: 50.0000 mg | ORAL_TABLET | Freq: Every day | ORAL | 1 refills | Status: DC

## 2019-11-17 MED ORDER — BUPROPION HCL ER (XL) 150 MG PO TB24: 150.0000 mg | ORAL_TABLET | Freq: Every morning | ORAL | 1 refills | Status: DC

## 2019-11-17 NOTE — Patient Instructions (Signed)
Health Maintenance, Female Adopting a healthy lifestyle and getting preventive care are important in promoting health and wellness. Ask your health care provider about:  The right schedule for you to have regular tests and exams.  Things you can do on your own to prevent diseases and keep yourself healthy. What should I know about diet, weight, and exercise? Eat a healthy diet   Eat a diet that includes plenty of vegetables, fruits, low-fat dairy products, and lean protein.  Do not eat a lot of foods that are high in solid fats, added sugars, or sodium. Maintain a healthy weight Body mass index (BMI) is used to identify weight problems. It estimates body fat based on height and weight. Your health care provider can help determine your BMI and help you achieve or maintain a healthy weight. Get regular exercise Get regular exercise. This is one of the most important things you can do for your health. Most adults should:  Exercise for at least 150 minutes each week. The exercise should increase your heart rate and make you sweat (moderate-intensity exercise).  Do strengthening exercises at least twice a week. This is in addition to the moderate-intensity exercise.  Spend less time sitting. Even light physical activity can be beneficial. Watch cholesterol and blood lipids Have your blood tested for lipids and cholesterol at 44 years of age, then have this test every 5 years. Have your cholesterol levels checked more often if:  Your lipid or cholesterol levels are high.  You are older than 44 years of age.  You are at high risk for heart disease. What should I know about cancer screening? Depending on your health history and family history, you may need to have cancer screening at various ages. This may include screening for:  Breast cancer.  Cervical cancer.  Colorectal cancer.  Skin cancer.  Lung cancer. What should I know about heart disease, diabetes, and high blood  pressure? Blood pressure and heart disease  High blood pressure causes heart disease and increases the risk of stroke. This is more likely to develop in people who have high blood pressure readings, are of African descent, or are overweight.  Have your blood pressure checked: ? Every 3-5 years if you are 18-39 years of age. ? Every year if you are 40 years old or older. Diabetes Have regular diabetes screenings. This checks your fasting blood sugar level. Have the screening done:  Once every three years after age 40 if you are at a normal weight and have a low risk for diabetes.  More often and at a younger age if you are overweight or have a high risk for diabetes. What should I know about preventing infection? Hepatitis B If you have a higher risk for hepatitis B, you should be screened for this virus. Talk with your health care provider to find out if you are at risk for hepatitis B infection. Hepatitis C Testing is recommended for:  Everyone born from 1945 through 1965.  Anyone with known risk factors for hepatitis C. Sexually transmitted infections (STIs)  Get screened for STIs, including gonorrhea and chlamydia, if: ? You are sexually active and are younger than 44 years of age. ? You are older than 44 years of age and your health care provider tells you that you are at risk for this type of infection. ? Your sexual activity has changed since you were last screened, and you are at increased risk for chlamydia or gonorrhea. Ask your health care provider if   you are at risk.  Ask your health care provider about whether you are at high risk for HIV. Your health care provider may recommend a prescription medicine to help prevent HIV infection. If you choose to take medicine to prevent HIV, you should first get tested for HIV. You should then be tested every 3 months for as long as you are taking the medicine. Pregnancy  If you are about to stop having your period (premenopausal) and  you may become pregnant, seek counseling before you get pregnant.  Take 400 to 800 micrograms (mcg) of folic acid every day if you become pregnant.  Ask for birth control (contraception) if you want to prevent pregnancy. Osteoporosis and menopause Osteoporosis is a disease in which the bones lose minerals and strength with aging. This can result in bone fractures. If you are 65 years old or older, or if you are at risk for osteoporosis and fractures, ask your health care provider if you should:  Be screened for bone loss.  Take a calcium or vitamin D supplement to lower your risk of fractures.  Be given hormone replacement therapy (HRT) to treat symptoms of menopause. Follow these instructions at home: Lifestyle  Do not use any products that contain nicotine or tobacco, such as cigarettes, e-cigarettes, and chewing tobacco. If you need help quitting, ask your health care provider.  Do not use street drugs.  Do not share needles.  Ask your health care provider for help if you need support or information about quitting drugs. Alcohol use  Do not drink alcohol if: ? Your health care provider tells you not to drink. ? You are pregnant, may be pregnant, or are planning to become pregnant.  If you drink alcohol: ? Limit how much you use to 0-1 drink a day. ? Limit intake if you are breastfeeding.  Be aware of how much alcohol is in your drink. In the [place] one drink equals one 12 oz bottle of beer (355 mL), one 5 oz glass of wine (148 mL), or one 1 oz glass of hard liquor (44 mL). General instructions  Schedule regular health, dental, and eye exams.  Stay current with your vaccines.  Tell your health care provider if: ? You often feel depressed. ? You have ever been abused or do not feel safe at home. Summary  Adopting a healthy lifestyle and getting preventive care are important in promoting health and wellness.  Follow your health care provider's instructions about healthy  diet, exercising, and getting tested or screened for diseases.  Follow your health care provider's instructions on monitoring your cholesterol and blood pressure. This information is not intended to replace advice given to you by your health care provider. Make sure you discuss any questions you have with your health care provider. Document Revised: 09/23/2018 Document Reviewed: 09/23/2018 Elsevier Patient Education  2020 [employer]  

## 2019-11-17 NOTE — Progress Notes (Signed)
Office Note 11/17/2019  CC:  Chief Complaint  Patient presents with  . Annual Exam    pt is not fasting    HPI:  Michelle Myers is a 44 y.o. White female who is here for annual health maintenance exam. She still sees MH providers at [facility]. She is still abstaining from alc, frequent AA meetings.  Feeling well, no complaints. Not exercising regularly.  Still smoking.   Past Medical History:  Diagnosis Date  . Alcoholism (HCC)    Ongoing (many years) of abuse until 05/17/2019 admission for detox.  Marland Kitchen Anxiety   . Elevated transaminase level    likely secondary to alcohol.  Viral Hep screens normal.  . Hypertension   . Left ankle sprain 09/13/2017   [place] Priority care: x-ray NORMAL 09/13/17.  . Tobacco dependence    ongoing as of 05/2019    Past Surgical History:  Procedure Laterality Date  . BREAST BIOPSY Right 07/2018   benign (fibroadenoma)  . CATARACT EXTRACTION W/ INTRAOCULAR LENS  IMPLANT, BILATERAL  2008 and 2010  . CERVICAL CONE BIOPSY     for HPV  . WISDOM TOOTH EXTRACTION      Family History  Problem Relation Age of Onset  . Prostate cancer Father   . Hypertension Father   . Brain cancer Daughter        neurofibromatosis, optic neurogliomas  . Diabetes Maternal Uncle   . Heart disease Maternal Grandmother        CBAG in late 40s  . Hypertension Maternal Grandmother   . Heart attack Maternal Grandfather 60  . Arthritis Mother   . Hypertension Mother   . Alcohol abuse Brother        half brother, maternal  . Mental illness Paternal Grandmother   . Heart disease Paternal Grandfather   . Heart attack Brother 41       half brother, maternal  . Stroke Neg Hx   . Breast cancer Neg Hx     Social History   Socioeconomic History  . Marital status: Married    Spouse name: Not on file  . Number of children: Not on file  . Years of education: Not on file  . Highest education level: Not on file  Occupational History  . Not on file   Tobacco Use  . Smoking status: Current Every Day Smoker    Packs/day: 1.00    Years: 24.00    Pack years: 24.00    Types: Cigarettes  . Smokeless tobacco: Never Used  . Tobacco comment: smoked 1992-present , up to 1.5 ppd  Substance and Sexual Activity  . Alcohol use: Yes    Alcohol/week: 0.0 standard drinks    Comment: 1.5 bottle of wine / night  . Drug use: No  . Sexual activity: Yes    Birth control/protection: Pill  Other Topics Concern  . Not on file  Social History Narrative   Married, 2 daughters.   Educ: Bachelor's degree   Occupation: Stay at home mom.   Youngest daughter has NF type 1 (age 34 as of 12/2015)   Tob: 24 pack-yr hx.   Alc: Alcoholic+.   Detox/rehab 05/2019.     Social Determinants of Health   Financial Resource Strain:   . Difficulty of Paying Living Expenses: Not on file  Food Insecurity:   . Worried About Programme researcher, broadcasting/film/video in the Last Year: Not on file  . Ran Out of Food in the Last Year: Not on file  Transportation Needs:   . Film/video editor (Medical): Not on file  . Lack of Transportation (Non-Medical): Not on file  Physical Activity:   . Days of Exercise per Week: Not on file  . Minutes of Exercise per Session: Not on file  Stress:   . Feeling of Stress : Not on file  Social Connections:   . Frequency of Communication with Friends and Family: Not on file  . Frequency of Social Gatherings with Friends and Family: Not on file  . Attends Religious Services: Not on file  . Active Member of Clubs or Organizations: Not on file  . Attends Archivist Meetings: Not on file  . Marital Status: Not on file  Intimate Partner Violence:   . Fear of Current or Ex-Partner: Not on file  . Emotionally Abused: Not on file  . Physically Abused: Not on file  . Sexually Abused: Not on file    Outpatient Medications Prior to Visit  Medication Sig Dispense Refill  . amLODipine (NORVASC) 10 MG tablet Take 1 tablet (10 mg total) by mouth  daily. 90 tablet 3  . buPROPion (WELLBUTRIN XL) 150 MG 24 hr tablet TAKE 1 TABLET BY MOUTH IN THE MORNING 30 tablet 0  . carvedilol (COREG) 25 MG tablet TAKE 1 TABLET BY MOUTH 2 TIMES DAILY WITH A MEAL. 180 tablet 0  . hydrochlorothiazide (HYDRODIURIL) 25 MG tablet TAKE 1 TABLET BY MOUTH EVERY DAY IN THE MORNING 90 tablet 0  . hydrOXYzine (ATARAX/VISTARIL) 10 MG tablet TAKE 1 TABLET (10 MG TOTAL) BY MOUTH 3 (THREE) TIMES DAILY AS NEEDED FOR ANXIETY. 90 tablet 1  . naltrexone (DEPADE) 50 MG tablet Take 50 mg by mouth daily. Take 1 tablet by mouth at bedtime.    . norethindrone (CAMILA) 0.35 MG tablet Take 1 tablet by mouth daily.    . pantoprazole (PROTONIX) 40 MG tablet Take 1 tablet (40 mg total) by mouth daily. 90 tablet 1  . sertraline (ZOLOFT) 100 MG tablet Take 0.5 tablets (50 mg total) by mouth daily. TAKE 1 TABLET BY MOUTH AT BEDTIME 30 tablet 1  . traZODone (DESYREL) 100 MG tablet Take 1 tablet (100 mg total) by mouth at bedtime as needed for sleep. 90 tablet 1   No facility-administered medications prior to visit.    Allergies  Allergen Reactions  . Lisinopril     Angioedema  . Epinephrine Other (See Comments)    Tachycardia, dizziness  . Sulfa Antibiotics      ? reaction    ROS Review of Systems  Constitutional: Negative for appetite change, chills, fatigue and fever.  HENT: Negative for congestion, dental problem, ear pain and sore throat.   Eyes: Negative for discharge, redness and visual disturbance.  Respiratory: Negative for cough, chest tightness, shortness of breath and wheezing.   Cardiovascular: Negative for chest pain, palpitations and leg swelling.  Gastrointestinal: Negative for abdominal pain, blood in stool, diarrhea, nausea and vomiting.  Genitourinary: Negative for difficulty urinating, dysuria, flank pain, frequency, hematuria and urgency.  Musculoskeletal: Negative for arthralgias, back pain, joint swelling, myalgias and neck stiffness.  Skin: Negative  for pallor and rash.  Neurological: Negative for dizziness, speech difficulty, weakness and headaches.  Hematological: Negative for adenopathy. Does not bruise/bleed easily.  Psychiatric/Behavioral: Negative for confusion and sleep disturbance. The patient is not nervous/anxious.     PE; Blood pressure 107/72, pulse 72, temperature 98.3 F (36.8 C), temperature source Temporal, resp. rate 16, height 5\' 5"  (1.651 m), weight 189 lb (  85.7 kg), SpO2 96 %. Body mass index is 31.45 kg/m. Exam chaperoned by Emi Holes, CMA.  Gen: Alert, well appearing.  Patient is oriented to person, place, time, and situation. AFFECT: pleasant, lucid thought and speech. ENT: Ears: EACs clear, normal epithelium.  TMs with good light reflex and landmarks bilaterally.  Eyes: no injection, icteris, swelling, or exudate.  EOMI, PERRLA. Nose: no drainage or turbinate edema/swelling.  No injection or focal lesion.  Mouth: lips without lesion/swelling.  Oral mucosa pink and moist.  Dentition intact and without obvious caries or gingival swelling.  Oropharynx without erythema, exudate, or swelling.  Neck: supple/nontender.  No LAD, mass, or TM.  Carotid pulses 2+ bilaterally, without bruits. CV: RRR, no m/r/g.   LUNGS: CTA bilat, nonlabored resps, good aeration in all lung fields. ABD: soft, NT, ND, BS normal.  No hepatospenomegaly or mass.  No bruits. EXT: no clubbing, cyanosis, or edema.  Musculoskeletal: no joint swelling, erythema, warmth, or tenderness.  ROM of all joints intact. Skin - no sores or suspicious lesions or rashes or color changes   Pertinent labs:  Lab Results  Component Value Date   TSH 1.75 07/29/2019   Lab Results  Component Value Date   WBC 11.5 (H) 07/29/2019   HGB 14.1 07/29/2019   HCT 42.2 07/29/2019   MCV 97.6 07/29/2019   PLT 342.0 07/29/2019   Lab Results  Component Value Date   CREATININE 0.65 07/29/2019   BUN 13 07/29/2019   NA 135 07/29/2019   K 4.5 07/29/2019   CL  100 07/29/2019   CO2 29 07/29/2019   Lab Results  Component Value Date   ALT 8 07/29/2019   AST 12 07/29/2019   ALKPHOS 63 07/29/2019   BILITOT 0.3 07/29/2019   Lab Results  Component Value Date   CHOL 166 11/26/2018   Lab Results  Component Value Date   HDL 78.90 11/26/2018   Lab Results  Component Value Date   LDLCALC 77 11/26/2018   Lab Results  Component Value Date   TRIG 54.0 11/26/2018   Lab Results  Component Value Date   CHOLHDL 2 11/26/2018   Lab Results  Component Value Date   HGBA1C 5.4 10/10/2014   ASSESSMENT AND PLAN:   Health maintenance exam: Reviewed age and gender appropriate health maintenance issues (prudent diet, regular exercise, health risks of tobacco and excessive alcohol, use of seatbelts, fire alarms in home, use of sunscreen).  Also reviewed age and gender appropriate health screening as well as vaccine recommendations. Vaccines:  All UTD. Labs: Future FLP and CMET. Cervical ca screening: next pap due later this year->GYN MD->has appt scheduled for 12/10/19. Breast ca screening: screening mammogram due-->she'll get this at her GYN appt 12/10/19. Colon ca screening:  average risk patient= as per latest guidelines, start screening at 34 yrs of age.  I RF'd her sertraline and wellbutrin, but she will continue to see psychiatrist b/c they need to continue to rx her naloxone.  She'll work on smoking cessation when she gets further out from quitting alcohol.  An After Visit Summary was printed and given to the patient.  FOLLOW UP:  No follow-ups on file.  Signed:  Santiago Bumpers, MD           11/17/2019

## 2019-11-22 ENCOUNTER — Other Ambulatory Visit: Payer: Self-pay

## 2019-11-22 ENCOUNTER — Ambulatory Visit (INDEPENDENT_AMBULATORY_CARE_PROVIDER_SITE_OTHER): Payer: BC Managed Care – PPO | Admitting: Family Medicine

## 2019-11-22 DIAGNOSIS — I10 Essential (primary) hypertension: Secondary | ICD-10-CM | POA: Diagnosis not present

## 2019-11-22 LAB — COMPREHENSIVE METABOLIC PANEL
ALT: 10 U/L (ref 0–35)
AST: 12 U/L (ref 0–37)
Albumin: 4 g/dL (ref 3.5–5.2)
Alkaline Phosphatase: 60 U/L (ref 39–117)
BUN: 10 mg/dL (ref 6–23)
CO2: 29 mEq/L (ref 19–32)
Calcium: 9.4 mg/dL (ref 8.4–10.5)
Chloride: 101 mEq/L (ref 96–112)
Creatinine, Ser: 0.64 mg/dL (ref 0.40–1.20)
GFR: 100.94 mL/min (ref >60.00)
Glucose, Bld: 98 mg/dL (ref 70–99)
Potassium: 4.3 mEq/L (ref 3.5–5.1)
Sodium: 137 mEq/L (ref 135–145)
Total Bilirubin: 0.3 mg/dL (ref 0.2–1.2)
Total Protein: 6.9 g/dL (ref 6.0–8.3)

## 2019-11-22 LAB — LIPID PANEL
Cholesterol: 174 mg/dL (ref 0–200)
HDL: 39.3 mg/dL (ref >39.00)
LDL Cholesterol: 122 mg/dL — ABNORMAL HIGH (ref 0–99)
NonHDL: 134.45
Total CHOL/HDL Ratio: 4
Triglycerides: 63 mg/dL (ref 0.0–149.0)
VLDL: 12.6 mg/dL (ref 0.0–40.0)

## 2019-11-24 ENCOUNTER — Other Ambulatory Visit: Payer: Self-pay | Admitting: Family Medicine

## 2019-12-09 ENCOUNTER — Other Ambulatory Visit: Payer: Self-pay | Admitting: Sports Medicine

## 2019-12-09 DIAGNOSIS — M7581 Other shoulder lesions, right shoulder: Secondary | ICD-10-CM | POA: Diagnosis not present

## 2019-12-09 DIAGNOSIS — M25511 Pain in right shoulder: Secondary | ICD-10-CM

## 2019-12-10 DIAGNOSIS — Z1231 Encounter for screening mammogram for malignant neoplasm of breast: Secondary | ICD-10-CM | POA: Diagnosis not present

## 2019-12-10 DIAGNOSIS — Z01419 Encounter for gynecological examination (general) (routine) without abnormal findings: Secondary | ICD-10-CM | POA: Diagnosis not present

## 2019-12-10 DIAGNOSIS — Z6831 Body mass index (BMI) 31.0-31.9, adult: Secondary | ICD-10-CM | POA: Diagnosis not present

## 2019-12-14 ENCOUNTER — Other Ambulatory Visit: Payer: Self-pay | Admitting: Family Medicine

## 2019-12-21 DIAGNOSIS — H02402 Unspecified ptosis of left eyelid: Secondary | ICD-10-CM | POA: Diagnosis not present

## 2019-12-22 ENCOUNTER — Other Ambulatory Visit: Payer: Self-pay

## 2019-12-22 ENCOUNTER — Ambulatory Visit
Admission: RE | Admit: 2019-12-22 | Discharge: 2019-12-22 | Disposition: A | Discharge status: Home / Self Care | Payer: BC Managed Care – PPO | Source: Ambulatory Visit | Attending: Sports Medicine | Admitting: Sports Medicine

## 2019-12-22 DIAGNOSIS — M25511 Pain in right shoulder: Secondary | ICD-10-CM | POA: Diagnosis not present

## 2019-12-24 DIAGNOSIS — M7581 Other shoulder lesions, right shoulder: Secondary | ICD-10-CM | POA: Diagnosis not present

## 2019-12-31 ENCOUNTER — Encounter: Payer: Self-pay | Admitting: Family Medicine

## 2020-01-05 ENCOUNTER — Telehealth: Payer: Self-pay

## 2020-01-05 NOTE — Telephone Encounter (Signed)
Patient had an office visit with Dr. Lucky Cowboy periodontist who mentioned that amlodipine could be the cause of her deep pockets in her gums. Please advise.

## 2020-01-05 NOTE — Telephone Encounter (Signed)
Sent to Dr.McGowen as Lorain Childes.  Spoke with patient regarding amlodipine and she mentioned we should be receiving a call or possible o/v notes from her appt yesterday. Dr.Knox recommended changing the medication or stopping it completely due to deep pockets in her gums.

## 2020-01-05 NOTE — Telephone Encounter (Signed)
OK to stop amlodipine. No replacement med for it at this time. Check bp and heart rate 3-4 days a week for 3 weeks and come in for in-person office visit after that to review numbers and see if additional bp med is needed at that time. If bp consistently >155 on top or >95 on bottom then have her call and make appt earlier than 3 wks.-thx

## 2020-01-06 NOTE — Telephone Encounter (Signed)
Tried to contact patient, unable to leave message.

## 2020-01-06 NOTE — Telephone Encounter (Signed)
Patient advised and voiced understanding.  

## 2020-01-18 ENCOUNTER — Other Ambulatory Visit: Payer: Self-pay | Admitting: Family Medicine

## 2020-02-02 ENCOUNTER — Telehealth: Payer: Self-pay

## 2020-02-02 NOTE — Telephone Encounter (Signed)
Patient feels like she may be having a med reaction of being OFF a med as of end of March. (blood pressure meds) Or maybe its something else. She is not sure how to describe it.  A "swoosch" feeling in her head.   Please advise.

## 2020-02-02 NOTE — Telephone Encounter (Signed)
Sounds like needs o/v because I can't really answer her questions without more info/talking to her. Virtual ok but prefer in person so we can check bp and heart rate here and get labs IF she needs them. This should not have to be a work in unless she feels like things are getting worse/progressing or she is having new symptoms-thx

## 2020-02-02 NOTE — Telephone Encounter (Signed)
She feels like she can "feel her pulse in her head". This has been occurring since Sunday night constantly but a few days prior to then off/on when she stood up. Denies dizziness. Most recent BP 131/89 w/ HR in the 70's. She has not had any issues with BP since stopping amlodipine almost 1 month ago.  Please advise, thanks.

## 2020-02-02 NOTE — Telephone Encounter (Signed)
Patient contacted and advised this would be best discussed during o/v for further evaluation. Appt scheduled for 4/26

## 2020-02-04 DIAGNOSIS — R87619 Unspecified abnormal cytological findings in specimens from cervix uteri: Secondary | ICD-10-CM | POA: Insufficient documentation

## 2020-02-04 DIAGNOSIS — K219 Gastro-esophageal reflux disease without esophagitis: Secondary | ICD-10-CM | POA: Insufficient documentation

## 2020-02-07 ENCOUNTER — Ambulatory Visit: Payer: BC Managed Care – PPO | Admitting: Family Medicine

## 2020-02-07 ENCOUNTER — Other Ambulatory Visit: Payer: Self-pay

## 2020-02-07 ENCOUNTER — Encounter: Payer: Self-pay | Admitting: Family Medicine

## 2020-02-07 VITALS — BP 116/83 | HR 113 | Temp 98.5°F | Resp 16 | Ht 65.0 in | Wt 196.8 lb

## 2020-02-07 DIAGNOSIS — F430 Acute stress reaction: Secondary | ICD-10-CM

## 2020-02-07 DIAGNOSIS — F411 Generalized anxiety disorder: Secondary | ICD-10-CM

## 2020-02-07 DIAGNOSIS — R519 Headache, unspecified: Secondary | ICD-10-CM | POA: Diagnosis not present

## 2020-02-07 DIAGNOSIS — H93A3 Pulsatile tinnitus, bilateral: Secondary | ICD-10-CM | POA: Diagnosis not present

## 2020-02-07 NOTE — Progress Notes (Signed)
See med student note from same date. Signed:  Santiago Bumpers, MD           02/07/2020

## 2020-02-07 NOTE — Progress Notes (Signed)
CC:  Michelle Myers is a [age] yo woman with a PMH of HTN, anxiety/depression, GERD, alcoholism and tobacco dependence who presents today for constant head throbbing for 6 days from April 18 until AM of April 24 (Saturday).   HPI:  Has had sensation of feeling her heart beat/pulse in her head/ears on and off (mostly with going from sitting to standing, very brief) in the recent past, then starting April 18 this sensation became constant, lasted until April 24 (2 d/a).  Some mild frontal HAs during this time but no throbbing/severe HAs,no vertigo or dizziness, no vision abnormalities, no focal weakness, no dysarthria, no paresthesias.  No ear pain or hearing impairment.  BP  -at home? - in 120s/high 8570s - At office? - 116/83 Dentist told her to d/c her amlodipine b/c she has periodontal dz --not clear to me why amlod needs to be d/c'd for this.  Today: no headaches or head sound throbbing actively, but has heard the sound of heart beats briefly when standing up   No LOC, N/V/D/Constipation  After 2 nights of the head pulsing, took trazadone to fall asleep and this helped.  Recent stressors:  Trying to go to nursing school at [employer] now but trying to find classes online  Feeling a lot more anxious but depression is okay  No thoughts of hurting herself or anyone else  Relationship issues with spouse (did not want to talk w daughter in room) --says husband mean and domineering but says she feels safe at home. However, she is trying to get a job and go to school for nursing at [employer] so she doesn't have to have him support her anymore.   How many drinks/wk?  Doing well with AA  -gone 9 mo w/o alcohol   How many cigs/wk?  1.5 pack/day still not up or down in last week   No trauma/falls to head that she knows of   Eczema on hands / forehead  Palpations and pressure in her chest intermittently and last week but this has been ongoing for years  Perhaps a little blurry vision? But no big changes     ROS: no fevers, no SOB, no wheezing, no cough, no rashes, no melena/hematochezia.  No polyuria or polydipsia.  No myalgias or arthralgias.  No focal weakness, paresthesias, or tremors.  No acute vision or hearing abnormalities. No n/v/d or abd pain.     PMH: Past Medical History:  Diagnosis Date  . Alcoholism (HCC)    Ongoing (many years) of abuse until 05/17/2019 admission for detox.  Marland Kitchen. Anxiety   . Elevated transaminase level    likely secondary to alcohol.  Viral Hep screens normal.  . Hypertension   . Left ankle sprain 09/13/2017   [place] Priority care: x-ray NORMAL 09/13/17.  . Rotator cuff tendonitis, right 2021   recalcitrant ->MRI 12/2019--tiny low grade partial thickness bursal region teres minor tear  . Tobacco dependence    ongoing as of 05/2019    M/A: Current Outpatient Medications on File Prior to Visit  Medication Sig Dispense Refill  . amLODipine (NORVASC) 10 MG tablet Take 1 tablet (10 mg total) by mouth daily. 90 tablet 3  . buPROPion (WELLBUTRIN XL) 150 MG 24 hr tablet Take 1 tablet (150 mg total) by mouth every morning. 90 tablet 1  . carvedilol (COREG) 25 MG tablet TAKE 1 TABLET BY MOUTH 2 TIMES DAILY WITH A MEAL. 180 tablet 0  . hydrochlorothiazide (HYDRODIURIL) 25 MG tablet TAKE 1 TABLET BY MOUTH  EVERY DAY IN THE MORNING 90 tablet 0  . hydrOXYzine (ATARAX/VISTARIL) 10 MG tablet TAKE 1 TABLET (10 MG TOTAL) BY MOUTH 3 (THREE) TIMES DAILY AS NEEDED FOR ANXIETY. 270 tablet 1  . naltrexone (DEPADE) 50 MG tablet Take 50 mg by mouth daily. Take 1 tablet by mouth at bedtime.    . norethindrone (CAMILA) 0.35 MG tablet Take 1 tablet by mouth daily.    . pantoprazole (PROTONIX) 40 MG tablet Take 1 tablet (40 mg total) by mouth daily. 90 tablet 1  . sertraline (ZOLOFT) 100 MG tablet TAKE 0.5-1 TABLET BY MOUTH AT BEDTIME 90 tablet 1  . traZODone (DESYREL) 100 MG tablet Take 1 tablet (100 mg total) by mouth at bedtime as needed for sleep. 90 tablet 1   No current  facility-administered medications on file prior to visit.   Allergies  Allergen Reactions  . Lisinopril     Angioedema  . Epinephrine Other (See Comments)    Tachycardia, dizziness  . Sulfa Antibiotics      ? reaction    FH: Family History  Problem Relation Age of Onset  . Prostate cancer Father   . Hypertension Father   . Brain cancer Daughter        neurofibromatosis, optic neurogliomas  . Diabetes Maternal Uncle   . Heart disease Maternal Grandmother        CBAG in late 66s  . Hypertension Maternal Grandmother   . Heart attack Maternal Grandfather 60  . Arthritis Mother   . Hypertension Mother   . Alcohol abuse Brother        half brother, maternal  . Mental illness Paternal Grandmother   . Heart disease Paternal Grandfather   . Heart attack Brother 31       half brother, maternal  . Stroke Neg Hx   . Breast cancer Neg Hx     SH: Social History   Socioeconomic History  . Marital status: Married    Spouse name: Not on file  . Number of children: Not on file  . Years of education: Not on file  . Highest education level: Not on file  Occupational History  . Not on file  Tobacco Use  . Smoking status: Current Every Day Smoker    Packs/day: 1.00    Years: 24.00    Pack years: 24.00    Types: Cigarettes  . Smokeless tobacco: Never Used  . Tobacco comment: smoked 1992-present , up to 1.5 ppd  Substance and Sexual Activity  . Alcohol use: Yes    Alcohol/week: 0.0 standard drinks    Comment: 1.5 bottle of wine / night  . Drug use: No  . Sexual activity: Yes    Birth control/protection: Pill  Other Topics Concern  . Not on file  Social History Narrative   Married, 2 daughters.   Educ: Bachelor's degree   Occupation: Stay at home mom.   Youngest daughter has NF type 1 (age 30 as of 12/2015)   Tob: 24 pack-yr hx.   Alc: Alcoholic+.   Detox/rehab 05/2019.     Social Determinants of Health   Financial Resource Strain:   . Difficulty of Paying Living  Expenses:   Food Insecurity:   . Worried About Charity fundraiser in the Last Year:   . Arboriculturist in the Last Year:   Transportation Needs:   . Film/video editor (Medical):   Marland Kitchen Lack of Transportation (Non-Medical):   Physical Activity:   . Days  of Exercise per Week:   . Minutes of Exercise per Session:   Stress:   . Feeling of Stress :   Social Connections:   . Frequency of Communication with Friends and Family:   . Frequency of Social Gatherings with Friends and Family:   . Attends Religious Services:   . Active Member of Clubs or Organizations:   . Attends Banker Meetings:   Marland Kitchen Marital Status:     ROS: Review of Systems  Constitutional: Negative for chills, diaphoresis, fever, malaise/fatigue and weight loss.  HENT: Positive for tinnitus (pulsating ear noise). Negative for congestion, ear discharge, ear pain, hearing loss, nosebleeds, sinus pain and sore throat.   Eyes: Positive for blurred vision. Negative for double vision, photophobia, pain, discharge and redness.  Respiratory: Negative for cough, hemoptysis, sputum production, shortness of breath, wheezing and stridor.   Cardiovascular: Positive for chest pain ("chest tightness") and palpitations. Negative for orthopnea, claudication, leg swelling and PND.  Gastrointestinal: Positive for heartburn. Negative for abdominal pain, blood in stool, constipation, diarrhea, melena, nausea and vomiting.  Genitourinary: Negative.   Musculoskeletal: Negative.   Skin: Negative for itching and rash.  Neurological: Positive for headaches. Negative for dizziness, tingling, tremors, sensory change, speech change, focal weakness, seizures, loss of consciousness and weakness.  Endo/Heme/Allergies: Negative.   Psychiatric/Behavioral: Negative for depression, hallucinations, memory loss and suicidal ideas. The patient is nervous/anxious.     PE: Vitals with BMI 11/17/2019 10/20/2019 07/29/2019  Height 5\' 5"  - 5\' 5"    Weight 189 lbs 183 lbs 8 oz 161 lbs 3 oz  BMI 31.45 - 26.83  Systolic 107 - 100  Diastolic 72 - 60  Pulse 72 - 72    Physical Exam  Constitutional: She is oriented to person, place, and time and well-developed, well-nourished, and in no distress. No distress.  HENT:  Head: Normocephalic and atraumatic.  Right Ear: External ear normal.  Left Ear: External ear normal.  Nose: Nose normal.  Mouth/Throat: Oropharynx is clear and moist. No oropharyngeal exudate.  Eyes: Pupils are equal, round, and reactive to light. Conjunctivae and EOM are normal. Right eye exhibits no discharge. Left eye exhibits no discharge. No scleral icterus.  Neck: No JVD present. No tracheal deviation present. No thyromegaly present.  Cardiovascular: Normal rate, regular rhythm, normal heart sounds and intact distal pulses. Exam reveals no gallop and no friction rub.  No murmur heard. Pulmonary/Chest: Effort normal. No stridor. No respiratory distress. She has no wheezes. She has no rales. She exhibits no tenderness.  Musculoskeletal:        General: No edema.     Cervical back: Normal range of motion and neck supple.  Lymphadenopathy:    She has no cervical adenopathy.  Neurological: She is alert and oriented to person, place, and time. She has normal sensation and intact cranial nerves. She displays normal reflexes. No cranial nerve deficit. She exhibits normal muscle tone. Coordination normal. GCS score is 15.  Skin: Skin is warm and dry. No rash noted. She is not diaphoretic. No erythema. No pallor.  Psychiatric: Mood, memory, affect and judgment normal.    Labs: Recent Results (from the past 2160 hour(s))  Lipid panel     Status: Abnormal   Collection Time: 11/22/19  9:51 AM  Result Value Ref Range   Cholesterol 174 0 - 200 mg/dL    Comment: ATP III Classification       Desirable:  < 200 mg/dL  Borderline High:  200 - 239 mg/dL          High:  > = 295 mg/dL   Triglycerides 28.4 0.0 - 149.0  mg/dL    Comment: Normal:  <132 mg/dLBorderline High:  150 - 199 mg/dL   HDL 44.01 >02.72 mg/dL   VLDL 53.6 0.0 - 64.4 mg/dL   LDL Cholesterol 034 (H) 0 - 99 mg/dL   Total CHOL/HDL Ratio 4     Comment:                Men          Women1/2 Average Risk     3.4          3.3Average Risk          5.0          4.42X Average Risk          9.6          7.13X Average Risk          15.0          11.0                       NonHDL 134.45     Comment: NOTE:  Non-HDL goal should be 30 mg/dL higher than patient's LDL goal (i.e. LDL goal of < 70 mg/dL, would have non-HDL goal of < 100 mg/dL)  Comprehensive metabolic panel     Status: None   Collection Time: 11/22/19  9:51 AM  Result Value Ref Range   Sodium 137 135 - 145 mEq/L   Potassium 4.3 3.5 - 5.1 mEq/L   Chloride 101 96 - 112 mEq/L   CO2 29 19 - 32 mEq/L   Glucose, Bld 98 70 - 99 mg/dL   BUN 10 6 - 23 mg/dL   Creatinine, Ser 7.42 0.40 - 1.20 mg/dL   Total Bilirubin 0.3 0.2 - 1.2 mg/dL   Alkaline Phosphatase 60 39 - 117 U/L   AST 12 0 - 37 U/L   ALT 10 0 - 35 U/L   Total Protein 6.9 6.0 - 8.3 g/dL   Albumin 4.0 3.5 - 5.2 g/dL   GFR 595.63 >87.56 mL/min   Calcium 9.4 8.4 - 10.5 mg/dL     A/P: In summary, [place] is a 44 year old woman with a past medical history of HTN, anxiety/depression, alcoholism, and 24 pack year smoking history: Constant pulse in head sensation, some frontal HAs during this time.   Spontaneously resolved 2 d/a.  Entire exam normal.  No red flag symptoms to make me proceed with imaging at this time (consider MRAbrain  to r/o aneurism).  We discussed her situation in depth today and it does seem like she is having an acute stress reaction (stressors noted in HPI) My plan is: stay hydrated, continue to work on coping mechanisms, return if sx come back/unmanaged mental stress from life/relationships HTN: continue carvedolol and hctz but ok to stay off amlodipine b/c pressure ok right now off of it. Anxiety/depression: continue  meds as indicated; discussed mindfulness exercises and mental wellness Alcoholism: advised to continue great work work with this: no alc for quite a while now. Smoking: advised to continue trying to cut down  GERD: continue meds as indicated   Spent 35 min with pt today, with >50% of this time spent in counseling and care coordination regarding the above problems.  Signed: Abigail Miyamoto, MS3 07 February 2020  I personally  was present during the history, physical exam, and medical decision-making activities of this service and have verified that the service and findings are accurately documented in the student's note. Signed:  Santiago Bumpers, MD           02/07/2020

## 2020-02-14 ENCOUNTER — Other Ambulatory Visit: Payer: Self-pay | Admitting: Family Medicine

## 2020-02-17 ENCOUNTER — Other Ambulatory Visit: Payer: Self-pay | Admitting: Family Medicine

## 2020-02-18 NOTE — Telephone Encounter (Signed)
RF request for trazadone. Last RX 07/29/19 # 90 x 1 rf. Last OV 02/07/20 Next OV 05/15/20 Refill appropriate?

## 2020-04-15 ENCOUNTER — Other Ambulatory Visit: Payer: Self-pay | Admitting: Family Medicine

## 2020-05-13 ENCOUNTER — Other Ambulatory Visit: Payer: Self-pay | Admitting: Family Medicine

## 2020-05-15 ENCOUNTER — Ambulatory Visit: Payer: BC Managed Care – PPO | Admitting: Family Medicine

## 2020-05-19 ENCOUNTER — Other Ambulatory Visit: Payer: Self-pay | Admitting: Family Medicine

## 2020-05-19 NOTE — Telephone Encounter (Signed)
RF request for pantoprazole 40mg  LOV:01/2620 Next ov: n/a Last written:02/14/20 (90,0)  Please advise if refill is appropriate.

## 2020-05-20 ENCOUNTER — Other Ambulatory Visit: Payer: Self-pay | Admitting: Family Medicine

## 2020-05-22 NOTE — Telephone Encounter (Signed)
Medication sent to pharmacy  

## 2020-05-22 NOTE — Telephone Encounter (Signed)
Pt notified of med refill

## 2020-05-23 ENCOUNTER — Other Ambulatory Visit: Payer: Self-pay | Admitting: Family Medicine

## 2020-06-14 ENCOUNTER — Other Ambulatory Visit: Payer: Self-pay | Admitting: Family Medicine

## 2020-06-14 NOTE — Telephone Encounter (Signed)
RF request for sertaline 100mg  LOV: 02/07/20  Next ov: n/a Last written: 12/14/19 (90,1)  Please advise, looks like patient was supposed to follow up in august per AVS, but I am not sure if that is correct due to no f/u instructions does pt need to have OV scheduled?

## 2020-07-13 ENCOUNTER — Telehealth (INDEPENDENT_AMBULATORY_CARE_PROVIDER_SITE_OTHER): Payer: BC Managed Care – PPO | Admitting: Family Medicine

## 2020-07-13 ENCOUNTER — Encounter: Payer: Self-pay | Admitting: Family Medicine

## 2020-07-13 ENCOUNTER — Other Ambulatory Visit: Payer: Self-pay

## 2020-07-13 VITALS — Wt 179.0 lb

## 2020-07-13 DIAGNOSIS — R070 Pain in throat: Secondary | ICD-10-CM | POA: Diagnosis not present

## 2020-07-13 NOTE — Progress Notes (Signed)
Virtual Visit via Video Note  I connected with pt on 07/13/20 at 10:30 AM EDT by a video enabled telemedicine application and verified that I am speaking with the correct person using two identifiers.  Location patient: home Location provider:work or home office Persons participating in the virtual visit: patient, provider  I discussed the limitations of evaluation and management by telemedicine and the availability of in person appointments. The patient expressed understanding and agreed to proceed.  Telemedicine visit is a necessity given the COVID-19 restrictions in place at the current time.  HPI: 44 y/o WF being seen today for throat discomfort.  HPI: Onset around 10 d/a, "like something sitting on my adam's apple", tickle in throat.   Mild throat pain when took deep breath in sometimes.  Felt sensation of mild R submand region fullness which is now gone. Also question of small "knot" or swelling in midline superior to thyroid gland region (now has gone down for the most part). No dysphagia.   No nasal congestion or sneezing, no cough or fever.  No fatigue or malaise.  Taste and smell intact.  She was burping a lot last week but felt no chest burning.  Appetite way down last week, now is back to normal. Got covid test at [facility] yesterday. The good news is, she feels back to normal the last 1 day at least.  She takes her PPI daily. No change in diet prior to onset. No known sick contacts.  ROS: See pertinent positives and negatives per HPI.  Past Medical History:  Diagnosis Date  . Alcoholism (HCC)    Ongoing (many years) of abuse until 05/17/2019 admission for detox.  Marland Kitchen Anxiety   . Elevated transaminase level    likely secondary to alcohol.  Viral Hep screens normal.  . Hypertension   . Left ankle sprain 09/13/2017   [place] Priority care: x-ray NORMAL 09/13/17.  . Rotator cuff tendonitis, right 2021   recalcitrant ->MRI 12/2019--tiny low grade partial thickness bursal  region teres minor tear  . Tobacco dependence    ongoing as of 05/2019    Past Surgical History:  Procedure Laterality Date  . BREAST BIOPSY Right 07/2018   benign (fibroadenoma)  . CATARACT EXTRACTION W/ INTRAOCULAR LENS  IMPLANT, BILATERAL  2008 and 2010  . CERVICAL CONE BIOPSY     for HPV  . WISDOM TOOTH EXTRACTION       Current Outpatient Medications:  .  buPROPion (WELLBUTRIN XL) 150 MG 24 hr tablet, TAKE 1 TABLET BY MOUTH EVERY DAY IN THE MORNING, Disp: 90 tablet, Rfl: 0 .  carvedilol (COREG) 25 MG tablet, TAKE 1 TABLET BY MOUTH 2 TIMES DAILY WITH A MEAL., Disp: 180 tablet, Rfl: 0 .  hydrochlorothiazide (HYDRODIURIL) 25 MG tablet, TAKE 1 TABLET BY MOUTH EVERY DAY IN THE MORNING, Disp: 90 tablet, Rfl: 0 .  hydrOXYzine (ATARAX/VISTARIL) 10 MG tablet, TAKE 1 TABLET (10 MG TOTAL) BY MOUTH 3 (THREE) TIMES DAILY AS NEEDED FOR ANXIETY., Disp: 270 tablet, Rfl: 1 .  naltrexone (DEPADE) 50 MG tablet, Take 50 mg by mouth daily. Take 1 tablet by mouth at bedtime., Disp: , Rfl:  .  norethindrone (CAMILA) 0.35 MG tablet, Take 1 tablet by mouth daily., Disp: , Rfl:  .  pantoprazole (PROTONIX) 40 MG tablet, TAKE 1 TABLET BY MOUTH EVERY DAY, Disp: 90 tablet, Rfl: 3 .  sertraline (ZOLOFT) 100 MG tablet, TAKE 0.5-1 TABLET BY MOUTH AT BEDTIME, Disp: 90 tablet, Rfl: 3 .  traZODone (DESYREL) 100 MG  tablet, TAKE 1 TABLET (100 MG TOTAL) BY MOUTH AT BEDTIME AS NEEDED FOR SLEEP., Disp: 90 tablet, Rfl: 1 .  amLODipine (NORVASC) 10 MG tablet, Take 1 tablet (10 mg total) by mouth daily. (Patient not taking: Reported on 02/07/2020), Disp: 90 tablet, Rfl: 3  EXAM:  VITALS per patient if applicable:  Vitals with BMI 07/13/2020 02/07/2020 11/17/2019  Height - 5\' 5"  5\' 5"   Weight 179 lbs 196 lbs 13 oz 189 lbs  BMI - 32.75 31.45  Systolic - 116 107  Diastolic - 83 72  Pulse - 113 72     GENERAL: alert, oriented, appears well and in no acute distress  HEENT: atraumatic, conjunttiva clear, no obvious  abnormalities on inspection of external nose and ears  NECK: normal movements of the head and neck  LUNGS: on inspection no signs of respiratory distress, breathing rate appears normal, no obvious gross SOB, gasping or wheezing  CV: no obvious cyanosis  MS: moves all visible extremities without noticeable abnormality  PSYCH/NEURO: pleasant and cooperative, no obvious depression or anxiety, speech and thought processing grossly intact  LABS: none today    Chemistry      Component Value Date/Time   NA 137 11/22/2019 0951   K 4.3 11/22/2019 0951   CL 101 11/22/2019 0951   CO2 29 11/22/2019 0951   BUN 10 11/22/2019 0951   CREATININE 0.64 11/22/2019 0951      Component Value Date/Time   CALCIUM 9.4 11/22/2019 0951   ALKPHOS 60 11/22/2019 0951   AST 12 11/22/2019 0951   ALT 10 11/22/2019 0951   BILITOT 0.3 11/22/2019 0951     ASSESSMENT AND PLAN:  Discussed the following assessment and plan:  Throat discomfort, question of submand/cerv adenopathy. Really no other symptoms. Seems to have resolved now after being present about 10d. Obs.  No new meds or med changes recommended. Suspect mild viral URI (doubt covid-->she has test pending) vs LPR.  -we discussed possible serious and likely etiologies, options for evaluation and workup, limitations of telemedicine visit vs in person visit, treatment, treatment risks and precautions. Pt prefers to treat via telemedicine empirically rather than in person at this moment.    I discussed the assessment and treatment plan with the patient. The patient was provided an opportunity to ask questions and all were answered. The patient agreed with the plan and demonstrated an understanding of the instructions.   F/u: prn  Signed:  01/20/2020, MD           07/13/2020

## 2020-08-07 ENCOUNTER — Other Ambulatory Visit: Payer: Self-pay | Admitting: Family Medicine

## 2020-08-15 ENCOUNTER — Other Ambulatory Visit: Payer: Self-pay | Admitting: Family Medicine

## 2020-08-29 ENCOUNTER — Other Ambulatory Visit: Payer: Self-pay | Admitting: Family Medicine

## 2020-09-14 ENCOUNTER — Other Ambulatory Visit: Payer: Self-pay | Admitting: Family Medicine

## 2020-09-23 ENCOUNTER — Other Ambulatory Visit: Payer: Self-pay

## 2020-09-23 ENCOUNTER — Encounter (HOSPITAL_BASED_OUTPATIENT_CLINIC_OR_DEPARTMENT_OTHER): Payer: Self-pay

## 2020-09-23 ENCOUNTER — Emergency Department (HOSPITAL_BASED_OUTPATIENT_CLINIC_OR_DEPARTMENT_OTHER): Payer: BC Managed Care – PPO

## 2020-09-23 ENCOUNTER — Emergency Department (HOSPITAL_BASED_OUTPATIENT_CLINIC_OR_DEPARTMENT_OTHER)
Admission: EM | Admit: 2020-09-23 | Discharge: 2020-09-23 | Disposition: A | Discharge status: Home / Self Care | Payer: BC Managed Care – PPO | Attending: Emergency Medicine | Admitting: Emergency Medicine

## 2020-09-23 DIAGNOSIS — R198 Other specified symptoms and signs involving the digestive system and abdomen: Secondary | ICD-10-CM | POA: Insufficient documentation

## 2020-09-23 DIAGNOSIS — M542 Cervicalgia: Secondary | ICD-10-CM | POA: Diagnosis not present

## 2020-09-23 DIAGNOSIS — I10 Essential (primary) hypertension: Secondary | ICD-10-CM | POA: Insufficient documentation

## 2020-09-23 DIAGNOSIS — Z79899 Other long term (current) drug therapy: Secondary | ICD-10-CM | POA: Insufficient documentation

## 2020-09-23 DIAGNOSIS — R070 Pain in throat: Secondary | ICD-10-CM | POA: Diagnosis not present

## 2020-09-23 DIAGNOSIS — Z9889 Other specified postprocedural states: Secondary | ICD-10-CM | POA: Diagnosis not present

## 2020-09-23 DIAGNOSIS — R221 Localized swelling, mass and lump, neck: Secondary | ICD-10-CM | POA: Diagnosis not present

## 2020-09-23 DIAGNOSIS — R0989 Other specified symptoms and signs involving the circulatory and respiratory systems: Secondary | ICD-10-CM | POA: Diagnosis not present

## 2020-09-23 DIAGNOSIS — F17210 Nicotine dependence, cigarettes, uncomplicated: Secondary | ICD-10-CM | POA: Insufficient documentation

## 2020-09-23 LAB — CBC WITH DIFFERENTIAL/PLATELET
Abs Immature Granulocytes: 0.04 10*3/uL (ref 0.00–0.07)
Basophils Absolute: 0 10*3/uL (ref 0.0–0.1)
Basophils Relative: 0 %
Eosinophils Absolute: 0.1 10*3/uL (ref 0.0–0.5)
Eosinophils Relative: 1 %
HCT: 42.9 % (ref 36.0–46.0)
Hemoglobin: 15.2 g/dL — ABNORMAL HIGH (ref 12.0–15.0)
Immature Granulocytes: 0 %
Lymphocytes Relative: 16 %
Lymphs Abs: 2.1 10*3/uL (ref 0.7–4.0)
MCH: 33.8 pg (ref 26.0–34.0)
MCHC: 35.4 g/dL (ref 30.0–36.0)
MCV: 95.3 fL (ref 80.0–100.0)
Monocytes Absolute: 0.8 10*3/uL (ref 0.1–1.0)
Monocytes Relative: 6 %
Neutro Abs: 9.7 10*3/uL — ABNORMAL HIGH (ref 1.7–7.7)
Neutrophils Relative %: 77 %
Platelets: 343 10*3/uL (ref 150–400)
RBC: 4.5 MIL/uL (ref 3.87–5.11)
RDW: 12.1 % (ref 11.5–15.5)
WBC: 12.8 10*3/uL — ABNORMAL HIGH (ref 4.0–10.5)
nRBC: 0 % (ref 0.0–0.2)

## 2020-09-23 LAB — COMPREHENSIVE METABOLIC PANEL
ALT: 19 U/L (ref 0–44)
AST: 27 U/L (ref 15–41)
Albumin: 4.5 g/dL (ref 3.5–5.0)
Alkaline Phosphatase: 60 U/L (ref 38–126)
Anion gap: 12 (ref 5–15)
BUN: 7 mg/dL (ref 6–20)
CO2: 24 mmol/L (ref 22–32)
Calcium: 9.2 mg/dL (ref 8.9–10.3)
Chloride: 96 mmol/L — ABNORMAL LOW (ref 98–111)
Creatinine, Ser: 0.6 mg/dL (ref 0.44–1.00)
GFR, Estimated: >60 mL/min (ref >60)
Glucose, Bld: 125 mg/dL — ABNORMAL HIGH (ref 70–99)
Potassium: 4 mmol/L (ref 3.5–5.1)
Sodium: 132 mmol/L — ABNORMAL LOW (ref 135–145)
Total Bilirubin: 1 mg/dL (ref 0.3–1.2)
Total Protein: 8.6 g/dL — ABNORMAL HIGH (ref 6.5–8.1)

## 2020-09-23 LAB — PREGNANCY, URINE: Preg Test, Ur: NEGATIVE

## 2020-09-23 MED ORDER — LORAZEPAM 1 MG PO TABS
0.5000 mg | ORAL_TABLET | Freq: Once | ORAL | Status: AC
  Administered 2020-09-23: 0.5 mg via ORAL
  Filled 2020-09-23: qty 1

## 2020-09-23 MED ORDER — FAMOTIDINE 20 MG PO TABS: 20.0000 mg | ORAL_TABLET | Freq: Every day | ORAL | 0 refills | Status: DC

## 2020-09-23 MED ORDER — IOHEXOL 300 MG/ML  SOLN
75.0000 mL | Freq: Once | INTRAMUSCULAR | Status: AC
  Administered 2020-09-23: 75 mL via INTRAVENOUS

## 2020-09-23 MED ORDER — ALUM & MAG HYDROXIDE-SIMETH 200-200-20 MG/5ML PO SUSP
15.0000 mL | Freq: Once | ORAL | Status: AC
  Administered 2020-09-23: 15 mL via ORAL
  Filled 2020-09-23: qty 30

## 2020-09-23 NOTE — ED Triage Notes (Signed)
Pt states for about 2 months she felt a "presence of something on my adams apple". States it feels "off" when she swallows and believes her voice sounds different. Denies shortness of breath, however has pain in neck when breathing. Hx of smoking. Was sent here by urgent care for ultrasound of neck.

## 2020-09-23 NOTE — Discharge Instructions (Signed)
Your CT scan was overall reassuring today.  No obvious masses or lesions. It did show that your adenoids are slightly swollen. You may try taking Pepcid in conjunction with your normal medications to help with symptom control. As needed for severe worsening symptoms, you may take Maalox over-the-counter. Flonase as appropriate as needed for sinus congestion, it does not cause addiction or rebound. Follow-up with ear nose and throat doctor listed below as needed for further evaluation. Return to the emergency room if you develop fevers, difficulty breathing, inability to swallow solid foods, or any new, worsening, or concerning symptoms

## 2020-09-23 NOTE — ED Provider Notes (Signed)
[facility] EMERGENCY DEPARTMENT Provider Note   CSN: [id number] Arrival date & time: 09/23/20  1047     History Chief Complaint  Patient presents with  . Neck Pain    Michelle Myers is a 44 y.o. female presenting for evaluation of neck pain.  Patient states for the past 2 months, she has felt like there is something pressing on her neck.  Over the past week, symptoms have worsened.  She states it is present all the time, worse with swallowing.  She was seen at urgent care, recommend to come to the ED for evaluation.  She denies fevers, chills, sore throat, cough, nausea, vomiting.  She does report a history of heartburn, states it has not been well controlled recently, but she continues to take Protonix daily.  She has not had this evaluated in the past 2 months other than a televisit initially with her PCP, no meds or work-up was ordered at that time.  She has not followed up with them.  She smokes 3/4 pack of cigarettes a day, has been doing this "for a while."  Additional history of anxiety.  HPI     Past Medical History:  Diagnosis Date  . Alcoholism (HCC)    Ongoing (many years) of abuse until 05/17/2019 admission for detox.  Marland Kitchen Anxiety   . Elevated transaminase level    likely secondary to alcohol.  Viral Hep screens normal.  . Hypertension   . Left ankle sprain 09/13/2017   [place] Priority care: x-ray NORMAL 09/13/17.  . Rotator cuff tendonitis, right 2021   recalcitrant ->MRI 12/2019--tiny low grade partial thickness bursal region teres minor tear  . Tobacco dependence    ongoing as of 05/2019    Patient Active Problem List   Diagnosis Date Noted  . Abnormal cervical Papanicolaou smear 02/04/2020  . GERD (gastroesophageal reflux disease) 02/04/2020  . Hyponatremia 05/18/2019  . ETOH abuse   . Syncope   . Abnormal LFTs   . Abdominal pain   . Depression   . Hypomagnesemia   . Insomnia 01/22/2016  . Elevated transaminase level 01/02/2016  . UTI  (urinary tract infection) 10/14/2015  . Chest pain 09/11/2015  . RHINITIS MEDICAMENTOSA 03/20/2010  . Anxiety state 03/08/2010  . TOBACCO USE 03/08/2010  . Essential hypertension 03/08/2010  . IBS 03/08/2010    Past Surgical History:  Procedure Laterality Date  . BREAST BIOPSY Right 07/2018   benign (fibroadenoma)  . CATARACT EXTRACTION W/ INTRAOCULAR LENS  IMPLANT, BILATERAL  2008 and 2010  . CERVICAL CONE BIOPSY     for HPV  . WISDOM TOOTH EXTRACTION       OB History   No obstetric history on file.     Family History  Problem Relation Age of Onset  . Prostate cancer Father   . Hypertension Father   . Brain cancer Daughter        neurofibromatosis, optic neurogliomas  . Diabetes Maternal Uncle   . Heart disease Maternal Grandmother        CBAG in late 44s  . Hypertension Maternal Grandmother   . Heart attack Maternal Grandfather 60  . Arthritis Mother   . Hypertension Mother   . Alcohol abuse Brother        half brother, maternal  . Mental illness Paternal Grandmother   . Heart disease Paternal Grandfather   . Heart attack Brother 4       half brother, maternal  . Stroke Neg Hx   .  Breast cancer Neg Hx     Social History   Tobacco Use  . Smoking status: Current Every Day Smoker    Packs/day: 0.75    Years: 24.00    Pack years: 18.00    Types: Cigarettes  . Smokeless tobacco: Never Used  . Tobacco comment: smoked 1992-present , up to 1.5 ppd  Vaping Use  . Vaping Use: Never used  Substance Use Topics  . Alcohol use: Not Currently    Alcohol/week: 0.0 standard drinks  . Drug use: No    Home Medications Prior to Admission medications   Medication Sig Start Date End Date Taking? Authorizing Provider  buPROPion (WELLBUTRIN XL) 150 MG 24 hr tablet TAKE 1 TABLET BY MOUTH EVERY DAY IN THE MORNING 08/15/20  Yes McGowen, Maryjean Morn, MD  carvedilol (COREG) 25 MG tablet TAKE 1 TABLET BY MOUTH 2 TIMES DAILY WITH A MEAL. 08/29/20  Yes McGowen, Maryjean Morn, MD   hydrochlorothiazide (HYDRODIURIL) 25 MG tablet TAKE 1 TABLET BY MOUTH EVERY DAY IN THE MORNING 08/29/20  Yes McGowen, Maryjean Morn, MD  hydrOXYzine (ATARAX/VISTARIL) 10 MG tablet TAKE 1 TABLET (10 MG TOTAL) BY MOUTH 3 (THREE) TIMES DAILY AS NEEDED FOR ANXIETY. 05/22/20  Yes McGowen, Maryjean Morn, MD  norethindrone (MICRONOR) 0.35 MG tablet Take 1 tablet by mouth daily.   Yes [provider]  pantoprazole (PROTONIX) 40 MG tablet TAKE 1 TABLET BY MOUTH EVERY DAY 05/19/20  Yes McGowen, Maryjean Morn, MD  sertraline (ZOLOFT) 100 MG tablet TAKE 0.5-1 TABLET BY MOUTH AT BEDTIME 06/14/20  Yes McGowen, Maryjean Morn, MD  traZODone (DESYREL) 100 MG tablet TAKE 1 TABLET (100 MG TOTAL) BY MOUTH AT BEDTIME AS NEEDED FOR SLEEP. 02/18/20  Yes McGowen, Maryjean Morn, MD  amLODipine (NORVASC) 10 MG tablet Take 1 tablet (10 mg total) by mouth daily. Patient not taking: No sig reported 09/14/19   McGowen, Maryjean Morn, MD  famotidine (PEPCID) 20 MG tablet Take 1 tablet (20 mg total) by mouth daily. 09/23/20   Sharnay Cashion, PA-C  naltrexone (DEPADE) 50 MG tablet Take 50 mg by mouth daily. Take 1 tablet by mouth at bedtime.    [provider]    Allergies    Lisinopril, Epinephrine, and Sulfa antibiotics  Review of Systems   Review of Systems  HENT: Positive for trouble swallowing.   All other systems reviewed and are negative.   Physical Exam Updated Vital Signs BP (!) 151/114 (BP Location: Left Arm)   Pulse 77   Temp 98.4 F (36.9 C) (Oral)   Resp 16   Ht 5' 4.5" (1.638 m)   Wt 86.2 kg   SpO2 100%   BMI 32.11 kg/m   Physical Exam Vitals and nursing note reviewed.  Constitutional:      General: She is not in acute distress.    Appearance: She is well-developed and well-nourished.     Comments: Appears very anxious. Otherwise nontoxic  HENT:     Head: Normocephalic and atraumatic.     Comments: OP clear without tonsillar swelling or exudate.  Uvula midline.  Airway grossly intact. Handling secretions  easily Eyes:     Extraocular Movements: Extraocular movements intact and EOM normal.     Conjunctiva/sclera: Conjunctivae normal.     Pupils: Pupils are equal, round, and reactive to light.  Neck:     Comments: Maybe very mild swelling/assymetry of the R anterior neck over the thyroid. No obvious LAD or thyromegaly. Cardiovascular:     Rate and Rhythm:  Normal rate and regular rhythm.     Pulses: Normal pulses and intact distal pulses.  Pulmonary:     Effort: Pulmonary effort is normal. No respiratory distress.     Breath sounds: Normal breath sounds. No wheezing.     Comments: Speaking in full sentences.  Clear lung sounds in all fields. Abdominal:     General: There is no distension.     Palpations: Abdomen is soft. There is no mass.     Tenderness: There is no abdominal tenderness. There is no guarding.  Musculoskeletal:        General: Normal range of motion.     Cervical back: Normal range of motion and neck supple.  Skin:    General: Skin is warm and dry.     Capillary Refill: Capillary refill takes less than 2 seconds.  Neurological:     Mental Status: She is alert and oriented to person, place, and time.  Psychiatric:        Mood and Affect: Mood is anxious.     ED Results / Procedures / Treatments   Labs (all labs ordered are listed, but only abnormal results are displayed) Labs Reviewed  CBC WITH DIFFERENTIAL/PLATELET - Abnormal; Notable for the following components:      Result Value   WBC 12.8 (*)    Hemoglobin 15.2 (*)    Neutro Abs 9.7 (*)    All other components within normal limits  COMPREHENSIVE METABOLIC PANEL - Abnormal; Notable for the following components:   Sodium 132 (*)    Chloride 96 (*)    Glucose, Bld 125 (*)    Total Protein 8.6 (*)    All other components within normal limits  PREGNANCY, URINE    EKG None  Radiology CT Soft Tissue Neck W Contrast  Result Date: 09/23/2020 CLINICAL DATA:  44 year old female with palpable  abnormality/globus sensation for about 2 months. Feels her voice has changed. Neck pain when breathing. EXAM: CT NECK WITH CONTRAST TECHNIQUE: Multidetector CT imaging of the neck was performed using the standard protocol following the bolus administration of intravenous contrast. CONTRAST:  75mL OMNIPAQUE IOHEXOL 300 MG/ML  SOLN COMPARISON:  Head CT 05/18/2019. FINDINGS: Pharynx and larynx: The glottis is closed. Laryngeal soft tissue contours and enhancement are within normal limits. Adenoid soft tissues appear somewhat increased for age but symmetric and without hyperenhancement (series 3, image 23). Soft palate also appears somewhat redundant. Palatine tonsils and hypopharynx are within normal limits; punctate postinflammatory calcification of the right tonsil. No pharyngeal masslike or suspicious hyperenhancement. Parapharyngeal and retropharyngeal spaces are within normal limits. Salivary glands: Negative sublingual space. Submandibular glands and parotid glands are within normal limits. Thyroid: Negative. Lymph nodes: Negative. No cervical lymphadenopathy. Bilateral nodes appear symmetric and within normal limits. Vascular: Major vascular structures in the neck and at the skull base are patent. Right ICA origin atherosclerosis without significant stenosis. Limited intracranial: Negative. Visualized orbits: Postoperative changes to both globes, otherwise negative. Mastoids and visualized paranasal sinuses: Moderate mucosal thickening in the left maxillary and sphenoid sinuses. Possible small left maxillary fluid level. Mild right maxillary and anterior ethmoid mucosal thickening. Tympanic cavities and mastoids are clear. Skeleton: No dental abnormality identified. No acute osseous abnormality identified. Upper chest: Negative.  Unremarkable visible esophagus. IMPRESSION: 1. Adenoids are larger than expected for age but symmetric and without hyperenhancement. Elsewhere no pharyngeal or laryngeal mass.  No  lymphadenopathy. In this setting recommend follow-up with ENT for direct visualization of the larynx and pharynx. 2.  Left greater than right paranasal sinus disease, possible mild sinusitis. Electronically Signed   By: Odessa Fleming M.D.   On: 09/23/2020 14:08    Procedures Procedures (including critical care time)  Medications Ordered in ED Medications  alum & mag hydroxide-simeth (MAALOX/MYLANTA) 200-200-20 MG/5ML suspension 15 mL (15 mLs Oral Given 09/23/20 1159)  LORazepam (ATIVAN) tablet 0.5 mg (0.5 mg Oral Given 09/23/20 1247)  iohexol (OMNIPAQUE) 300 MG/ML solution 75 mL (75 mLs Intravenous Contrast Given 09/23/20 1344)    ED Course  I have reviewed the triage vital signs and the nursing notes.  Pertinent labs & imaging results that were available during my care of the patient were reviewed by me and considered in my medical decision making (see chart for details).    MDM Rules/Calculators/A&P                          Patient presenting for evaluation of discomfort of the throat, especially with swallowing.  This is been present for several months, however worsened in the past week.  She does have a history of heartburn which has been worse recently, consider GERD.  Consider anxiety.  Also consider thyroid lesion/nodule. Less likely infection due to the longer course. As sxs have been worsening, will order ct of the neck to look for concerning lesions/massess.   CT negative for acute findings.  Does show bilaterally enlarged adenoids.  No obvious mass.  Patient with chronic sinusitis.  Discussed findings with patient.  Discussed possibility of reflux as cause of globus sensation.  Discussed enlarged adenoids.  Encouraged symptomatic treatment, follow-up with ENT as needed.  At this time, patient appears safe for discharge.  Return precautions given.  Patient states she understands and agrees to plan.   Final Clinical Impression(s) / ED Diagnoses Final diagnoses:  Globus sensation     Rx / DC Orders ED Discharge Orders         Ordered    famotidine (PEPCID) 20 MG tablet  Daily        09/23/20 1506           Alveria Apley, PA-C 09/23/20 1515    Linwood Dibbles, MD 09/24/20 [phone]

## 2020-09-23 NOTE — ED Notes (Signed)
Pt wants to hold off on bloodwork and IV and see if medications helps first. Requesting to speak with provider again prior to labs/scan, will notify provider

## 2020-09-23 NOTE — ED Notes (Signed)
Patient in CT

## 2020-09-26 ENCOUNTER — Other Ambulatory Visit: Payer: Self-pay | Admitting: Family Medicine

## 2020-09-27 ENCOUNTER — Telehealth: Payer: Self-pay | Admitting: Family Medicine

## 2020-09-27 ENCOUNTER — Encounter: Payer: Self-pay | Admitting: Family Medicine

## 2020-09-27 ENCOUNTER — Other Ambulatory Visit: Payer: Self-pay

## 2020-09-27 ENCOUNTER — Ambulatory Visit: Payer: BC Managed Care – PPO | Admitting: Family Medicine

## 2020-09-27 VITALS — BP 148/95 | HR 97 | Temp 98.0°F | Resp 16 | Ht 65.0 in | Wt 177.6 lb

## 2020-09-27 DIAGNOSIS — F411 Generalized anxiety disorder: Secondary | ICD-10-CM

## 2020-09-27 DIAGNOSIS — F1020 Alcohol dependence, uncomplicated: Secondary | ICD-10-CM | POA: Diagnosis not present

## 2020-09-27 DIAGNOSIS — R198 Other specified symptoms and signs involving the digestive system and abdomen: Secondary | ICD-10-CM

## 2020-09-27 DIAGNOSIS — I10 Essential (primary) hypertension: Secondary | ICD-10-CM | POA: Diagnosis not present

## 2020-09-27 DIAGNOSIS — K219 Gastro-esophageal reflux disease without esophagitis: Secondary | ICD-10-CM

## 2020-09-27 DIAGNOSIS — R0989 Other specified symptoms and signs involving the circulatory and respiratory systems: Secondary | ICD-10-CM

## 2020-09-27 DIAGNOSIS — F410 Panic disorder [episodic paroxysmal anxiety] without agoraphobia: Secondary | ICD-10-CM

## 2020-09-27 DIAGNOSIS — F1010 Alcohol abuse, uncomplicated: Secondary | ICD-10-CM | POA: Diagnosis not present

## 2020-09-27 MED ORDER — LORAZEPAM 1 MG PO TABS: ORAL_TABLET | ORAL | 0 refills | Status: DC

## 2020-09-27 MED ORDER — BUPROPION HCL ER (XL) 150 MG PO TB24
ORAL_TABLET | ORAL | 1 refills | Status: DC
Start: 2020-09-27 — End: 2021-03-20

## 2020-09-27 NOTE — Telephone Encounter (Signed)
Update: Patient called back and schedule appt for today, 09/27/20 at 1:00.

## 2020-09-27 NOTE — Telephone Encounter (Signed)
Patient requests refill of buproprion, she states she is completely out. She has scheduled appt for 10/05/20.

## 2020-09-27 NOTE — Telephone Encounter (Signed)
Refills can be discussed at appt today

## 2020-09-27 NOTE — Progress Notes (Signed)
OFFICE VISIT  09/27/2020  CC:  Chief Complaint  Patient presents with  . Follow-up    anxiety    HPI:    Patient is a 44 y.o. Caucasian female who presents for f/u anxiety, HTN, GERD, tob abuse.   Recent ED visit 09/23/20 for globus sensation.  I reviewed encounter record in entirety today. Question GER, question sx's related to CT neck findings (see below), question anxiety contributing to sx's.  D/C'd home on famotidine to take along with her protonix 40mg  qd.  I saw her for same c/o (virtual) on 07/13/20:  A/P as of that visit: "Throat discomfort, question of submand/cerv adenopathy. Really no other symptoms. Seems to have resolved now after being present about 10d. Obs.  No new meds or med changes recommended. Suspect mild viral URI (doubt covid-->she has test pending) vs LPR."  Today she reveals that she has been drinking alc again since Halloween. Three small boxes of wine daily.  Very anxious all the time, guilt eating her up, hiding it from everyone.  Her throat sx's just further exacerbate her worry. She says she simply can't go back into rehab b/c of cost and "my husband will be so angry". Denies drug use. Has been compliant with all meds except amlodipine, which her dentist recommended she stop b/c of suspicion it was contributing to plaque/gum dz. No home bp monitoring lately.   Her last drink was last night.  Still smoking cigs some.  ROS: lots of probs with flushing, palpitations, shaky, feeling overwhelmed and sometimes chest tightness and sob. No fevers, no rash, no joint swelling, no abd pain, no n/v/d.     Past Medical History:  Diagnosis Date  . Alcoholism (HCC)    Ongoing (many years) of abuse until 05/17/2019 admission for detox.  07/17/2019 Anxiety   . Elevated transaminase level    likely secondary to alcohol.  Viral Hep screens normal.  . Hypertension   . Left ankle sprain 09/13/2017   [facility] Priority care: x-ray NORMAL 09/13/17.  . Rotator cuff  tendonitis, right 2021   recalcitrant ->MRI 12/2019--tiny low grade partial thickness bursal region teres minor tear  . Tobacco dependence    ongoing as of 05/2019    Past Surgical History:  Procedure Laterality Date  . BREAST BIOPSY Right 07/2018   benign (fibroadenoma)  . CATARACT EXTRACTION W/ INTRAOCULAR LENS  IMPLANT, BILATERAL  2008 and 2010  . CERVICAL CONE BIOPSY     for HPV  . WISDOM TOOTH EXTRACTION      Outpatient Medications Prior to Visit  Medication Sig Dispense Refill  . carvedilol (COREG) 25 MG tablet TAKE 1 TABLET BY MOUTH 2 TIMES DAILY WITH A MEAL. 60 tablet 0  . hydrochlorothiazide (HYDRODIURIL) 25 MG tablet TAKE 1 TABLET BY MOUTH EVERY DAY IN THE MORNING 30 tablet 0  . hydrOXYzine (ATARAX/VISTARIL) 10 MG tablet TAKE 1 TABLET (10 MG TOTAL) BY MOUTH 3 (THREE) TIMES DAILY AS NEEDED FOR ANXIETY. 270 tablet 1  . norethindrone (MICRONOR) 0.35 MG tablet Take 1 tablet by mouth daily.    . pantoprazole (PROTONIX) 40 MG tablet TAKE 1 TABLET BY MOUTH EVERY DAY 90 tablet 3  . sertraline (ZOLOFT) 100 MG tablet TAKE 0.5-1 TABLET BY MOUTH AT BEDTIME 90 tablet 3  . traZODone (DESYREL) 100 MG tablet TAKE 1 TABLET (100 MG TOTAL) BY MOUTH AT BEDTIME AS NEEDED FOR SLEEP. 90 tablet 1  . buPROPion (WELLBUTRIN XL) 150 MG 24 hr tablet TAKE 1 TABLET BY MOUTH EVERY DAY  IN THE MORNING 30 tablet 0  . amLODipine (NORVASC) 10 MG tablet Take 1 tablet (10 mg total) by mouth daily. (Patient not taking: No sig reported) 90 tablet 3  . famotidine (PEPCID) 20 MG tablet Take 1 tablet (20 mg total) by mouth daily. (Patient not taking: Reported on 09/27/2020) 20 tablet 0  . naltrexone (DEPADE) 50 MG tablet Take 50 mg by mouth daily. Take 1 tablet by mouth at bedtime. (Patient not taking: Reported on 09/27/2020)     No facility-administered medications prior to visit.    Allergies  Allergen Reactions  . Lisinopril     Angioedema  . Epinephrine Other (See Comments)    Tachycardia, dizziness  . Sulfa  Antibiotics      ? reaction    ROS As per HPI  PE: Vitals with BMI 09/27/2020 09/23/2020 09/23/2020  Height 5\' 5"  - -  Weight 177 lbs 10 oz - -  BMI 29.55 - -  Systolic 148 151  Diastolic 95 114 110  Pulse 97 77 77     Gen: Alert, well appearing.  Patient is oriented to person, place, time, and situation. Affect: tearful, anxious. 185: no injection, icteris, swelling, or exudate.  EOMI, PERRLA. Mouth: lips without lesion/swelling.  Oral mucosa pink and moist. Oropharynx without erythema, exudate, or swelling.  Neck: no adenopathy, mass, or tenderness. CV: RRR, no m/r/g.   LUNGS: CTA bilat, nonlabored resps, good aeration in all lung fields. SKIN: no jaundice  LABS:    Chemistry      Component Value Date/Time   NA 132 (L) 09/23/2020 1253   K 4.0 09/23/2020 1253   CL 96 (L) 09/23/2020 1253   CO2 24 09/23/2020 1253   BUN 7 09/23/2020 1253   CREATININE 0.60 09/23/2020 1253      Component Value Date/Time   CALCIUM 9.2 09/23/2020 1253   ALKPHOS 60 09/23/2020 1253   AST 27 09/23/2020 1253   ALT 19 09/23/2020 1253   BILITOT 1.0 09/23/2020 1253     Lab Results  Component Value Date   WBC 12.8 (H) 09/23/2020   HGB 15.2 (H) 09/23/2020   HCT 42.9 09/23/2020   MCV 95.3 09/23/2020   PLT 343 09/23/2020   Lab Results  Component Value Date   TSH 1.75 07/29/2019   CT soft tissue neck with contrast on 09/23/20: IMPRESSION: 1. Adenoids are larger than expected for age but symmetric and without hyperenhancement. Elsewhere no pharyngeal or laryngeal mass.  No lymphadenopathy. In this setting recommend follow-up with ENT for direct visualization of the larynx and pharynx.  2. Left greater than right paranasal sinus disease, possible mild Sinusitis."   IMPRESSION AND PLAN:  1) Alcoholism, relapse. She wants to try quitting w/out going to rehab. We'll try loraz 1mg  tid to help minimize w/drawal.  We discussed the danger of trying to continue to drink alcohol  while taking this med. Continue wellbutrin xl 150mg  qd (RF'd today) + sertraline 100mg  qd.    2) HTN: not well controlled at this time. OK to hold off on changing/adding med at this time but will re-address this possibility if persistent poor control after she has been off alcohol a couple of weeks or so OR if getting worse before that. Cont hctz 25mg  qd, coreg 25mg  bid.  3) Globus sensation; LPR effects + ETOH effects contributing. Not real convinced the CT neck findings from a few days ago (see above) are of any real correlation but we'll re-eval as time goes on. For  now, continue protonix 40mg  qd.  Consider ENT referral again in future if sx's persist when she is better from all of her other current issues.  An After Visit Summary was printed and given to the patient.  FOLLOW UP: Return in about 1 week (around 10/04/2020) for f/u anx/bp.  Signed:  10/06/2020, MD           09/27/2020

## 2020-09-30 ENCOUNTER — Other Ambulatory Visit: Payer: Self-pay | Admitting: Family Medicine

## 2020-10-04 ENCOUNTER — Other Ambulatory Visit: Payer: Self-pay

## 2020-10-05 ENCOUNTER — Encounter: Payer: Self-pay | Admitting: Family Medicine

## 2020-10-05 ENCOUNTER — Ambulatory Visit: Payer: BC Managed Care – PPO | Admitting: Family Medicine

## 2020-10-05 VITALS — BP 151/102 | HR 78 | Temp 98.4°F | Resp 16 | Ht 65.0 in | Wt 178.4 lb

## 2020-10-05 DIAGNOSIS — F411 Generalized anxiety disorder: Secondary | ICD-10-CM | POA: Diagnosis not present

## 2020-10-05 DIAGNOSIS — F1020 Alcohol dependence, uncomplicated: Secondary | ICD-10-CM

## 2020-10-05 DIAGNOSIS — F1021 Alcohol dependence, in remission: Secondary | ICD-10-CM | POA: Diagnosis not present

## 2020-10-05 DIAGNOSIS — I10 Essential (primary) hypertension: Secondary | ICD-10-CM

## 2020-10-05 MED ORDER — SERTRALINE HCL 200 MG PO CAPS: 200.0000 mg | ORAL_CAPSULE | Freq: Every day | ORAL | 6 refills | Status: DC

## 2020-10-05 MED ORDER — IRBESARTAN 75 MG PO TABS: 75.0000 mg | ORAL_TABLET | Freq: Every day | ORAL | 0 refills | Status: DC

## 2020-10-05 MED ORDER — SERTRALINE HCL 100 MG PO TABS: ORAL_TABLET | ORAL | 3 refills | Status: DC

## 2020-10-05 NOTE — Progress Notes (Signed)
OFFICE VISIT  10/05/2020  CC:  Chief Complaint  Patient presents with  . Follow-up    Anxiety,depression,hypertension    HPI:    Patient is a 44 y.o. Caucasian female who presents for 1 week f/u alcoholism with recent relapse, GAD w/panic, and HTN. A/P as of last visit: "1) Alcoholism, relapse. She wants to try quitting w/out going to rehab. We'll try loraz 1mg  tid to help minimize w/drawal.  We discussed the danger of trying to continue to drink alcohol while taking this med. Continue wellbutrin xl 150mg  qd (RF'd today) + sertraline 100mg  qd.    2) HTN: not well controlled at this time. OK to hold off on changing/adding med at this time but will re-address this possibility if persistent poor control after she has been off alcohol a couple of weeks or so OR if getting worse before that. Cont hctz 25mg  qd, coreg 25mg  bid.  3) Globus sensation; LPR effects + ETOH effects contributing. Not real convinced the CT neck findings from a few days ago (see above) are of any real correlation but we'll re-eval as time goes on. For now, continue protonix 40mg  qd.  Consider ENT referral again in future if sx's persist when she is better from all of her other current issues."  INTERIM HX:  Doing MUCH better, has not had any alcohol since I saw her last week. Took loraz bid for a couple days, then qd for a couple days, then stopped it. Compliant with pantop,hydroxyzine, buprop, sertraline, trazodone, hctz, and coreg. No home bp monitoring. Eating and drinking well. Her chronic anxiety is sub-optimally controlled.  Has mild depression but states nothing persistent. No SI or HI.  ROS: no fevers, no CP, no SOB, no wheezing, no cough, no dizziness, no HAs, no rashes, no melena/hematochezia.  No polyuria or polydipsia.  No myalgias or arthralgias.  No focal weakness, paresthesias, or tremors.  No acute vision or hearing abnormalities. No n/v/d or abd pain.  No palpitations.    Past Medical  History:  Diagnosis Date  . Alcoholism (HCC)    Ongoing (many years) of abuse until 05/17/2019 admission for detox.  Anxiety   . Elevated transaminase level    likely secondary to alcohol.  Viral Hep screens normal.  . Hypertension   . Left ankle sprain 09/13/2017   [facility] Priority care: x-ray NORMAL 09/13/17.  . Rotator cuff tendonitis, right 2021   recalcitrant ->MRI 12/2019--tiny low grade partial thickness bursal region teres minor tear  . Tobacco dependence    ongoing as of 05/2019    Past Surgical History:  Procedure Laterality Date  . BREAST BIOPSY Right 07/2018   benign (fibroadenoma)  . CATARACT EXTRACTION W/ INTRAOCULAR LENS  IMPLANT, BILATERAL  2008 and 2010  . CERVICAL CONE BIOPSY     for HPV  . WISDOM TOOTH EXTRACTION      Outpatient Medications Prior to Visit  Medication Sig Dispense Refill  . buPROPion (WELLBUTRIN XL) 150 MG 24 hr tablet TAKE 1 TABLET BY MOUTH EVERY DAY IN THE MORNING 90 tablet 1  . carvedilol (COREG) 25 MG tablet TAKE 1 TABLET BY MOUTH 2 TIMES DAILY WITH A MEAL. 60 tablet 0  . hydrochlorothiazide (HYDRODIURIL) 25 MG tablet TAKE 1 TABLET BY MOUTH EVERY DAY IN THE MORNING 30 tablet 0  . hydrOXYzine (ATARAX/VISTARIL) 10 MG tablet TAKE 1 TABLET (10 MG TOTAL) BY MOUTH 3 (THREE) TIMES DAILY AS NEEDED FOR ANXIETY. 270 tablet 1  . LORazepam (ATIVAN) 1 MG tablet  1 tab po tid 30 tablet 0  . norethindrone (MICRONOR) 0.35 MG tablet Take 1 tablet by mouth daily.    . pantoprazole (PROTONIX) 40 MG tablet TAKE 1 TABLET BY MOUTH EVERY DAY 90 tablet 3  . traZODone (DESYREL) 100 MG tablet TAKE 1 TABLET (100 MG TOTAL) BY MOUTH AT BEDTIME AS NEEDED FOR SLEEP. 90 tablet 1  . sertraline (ZOLOFT) 100 MG tablet TAKE 0.5-1 TABLET BY MOUTH AT BEDTIME 90 tablet 3   No facility-administered medications prior to visit.    Allergies  Allergen Reactions  . Lisinopril     Angioedema  . Epinephrine Other (See Comments)    Tachycardia, dizziness  . Sulfa Antibiotics       ? reaction    ROS As per HPI  PE: Vitals with BMI 10/05/2020 09/27/2020 09/23/2020  Height 5\' 5"  5\' 5"  -  Weight 178 lbs 6 oz 177 lbs 10 oz -  BMI 29.69 29.55 -  Systolic 151 148  Diastolic 102 95 114  Pulse 78 97 77  Rpt manually in each arm at end of visit today was 150/100  Gen: Alert, well appearing.  Patient is oriented to person, place, time, and situation. AFFECT: pleasant, lucid thought and speech. No further exam today.  LABS:    Chemistry      Component Value Date/Time   NA 132 (L) 09/23/2020 1253   K 4.0 09/23/2020 1253   CL 96 (L) 09/23/2020 1253   CO2 24 09/23/2020 1253   BUN 7 09/23/2020 1253   CREATININE 0.60 09/23/2020 1253      Component Value Date/Time   CALCIUM 9.2 09/23/2020 1253   ALKPHOS 60 09/23/2020 1253   AST 27 09/23/2020 1253   ALT 19 09/23/2020 1253   BILITOT 1.0 09/23/2020 1253     Lab Results  Component Value Date   WBC 12.8 (H) 09/23/2020   HGB 15.2 (H) 09/23/2020   HCT 42.9 09/23/2020   MCV 95.3 09/23/2020   PLT 343 09/23/2020   Lab Results  Component Value Date   TSH 1.75 07/29/2019    IMPRESSION AND PLAN:  1) Alcoholism, recent relapse. Now abstinent x 1 wk, used some loraz to prevent/treat w/drawal. No new rx needed for this med. She is getting back into regular attendance at [employer] and is in contact with her sponsor.  2) GAD, hx of MDD. Anxiety is sub-optimally controlled.  I think getting this problem better will help increase her chance of prolonged abstinence from alcohol. Increase sertraline to 200mg  qd.  Continue bupropion xl 150mg  qd.  OK to continue to take loraz 1mg  tid PRN severe anxiety. If she stays on this med over the next couple of months and follows up appropriately then I'll get CSC in chart, UDS as well.  3) Uncontrolled HTN: add irbesartan 75mg , cont hctz 25 qd and coreg 25 bid. Try to monitor bp some at home or pharmacy. Recheck in office 2 wks and check bmet at that time as well.   An After  Visit Summary was printed and given to the patient.  FOLLOW UP: Return in about 2 weeks (around 10/19/2020) for f/u HTN and anxiety.  Signed:  [facility], MD           10/05/2020

## 2020-10-05 NOTE — Addendum Note (Signed)
Addended by: Jeoffrey Massed on: 10/05/2020 01:09 PM   Modules accepted: Orders

## 2020-10-14 HISTORY — PX: MOUTH SURGERY: SHX715

## 2020-10-19 ENCOUNTER — Ambulatory Visit: Payer: BC Managed Care – PPO | Admitting: Family Medicine

## 2020-10-21 ENCOUNTER — Other Ambulatory Visit: Payer: Self-pay | Admitting: Family Medicine

## 2020-10-26 ENCOUNTER — Other Ambulatory Visit: Payer: Self-pay

## 2020-10-27 ENCOUNTER — Encounter: Payer: Self-pay | Admitting: Family Medicine

## 2020-10-27 ENCOUNTER — Ambulatory Visit: Payer: BC Managed Care – PPO | Admitting: Family Medicine

## 2020-10-27 ENCOUNTER — Other Ambulatory Visit: Payer: Self-pay | Admitting: Family Medicine

## 2020-10-27 VITALS — BP 133/91 | HR 82 | Temp 98.2°F | Resp 16 | Ht 65.0 in | Wt 172.6 lb

## 2020-10-27 DIAGNOSIS — F1020 Alcohol dependence, uncomplicated: Secondary | ICD-10-CM

## 2020-10-27 DIAGNOSIS — R1013 Epigastric pain: Secondary | ICD-10-CM

## 2020-10-27 DIAGNOSIS — F411 Generalized anxiety disorder: Secondary | ICD-10-CM

## 2020-10-27 DIAGNOSIS — F339 Major depressive disorder, recurrent, unspecified: Secondary | ICD-10-CM | POA: Diagnosis not present

## 2020-10-27 DIAGNOSIS — I10 Essential (primary) hypertension: Secondary | ICD-10-CM

## 2020-10-27 LAB — BASIC METABOLIC PANEL
BUN: 10 mg/dL (ref 6–23)
CO2: 26 mEq/L (ref 19–32)
Calcium: 9.5 mg/dL (ref 8.4–10.5)
Chloride: 94 mEq/L — ABNORMAL LOW (ref 96–112)
Creatinine, Ser: 0.64 mg/dL (ref 0.40–1.20)
GFR: 107.31 mL/min (ref >60.00)
Glucose, Bld: 122 mg/dL — ABNORMAL HIGH (ref 70–99)
Potassium: 4 mEq/L (ref 3.5–5.1)
Sodium: 131 mEq/L — ABNORMAL LOW (ref 135–145)

## 2020-10-27 MED ORDER — SERTRALINE HCL 100 MG PO TABS: ORAL_TABLET | ORAL | 3 refills | Status: DC

## 2020-10-27 MED ORDER — IRBESARTAN 150 MG PO TABS
150.0000 mg | ORAL_TABLET | Freq: Every day | ORAL | 1 refills | Status: DC
Start: 2020-10-27 — End: 2020-12-20

## 2020-10-27 MED ORDER — CARVEDILOL 25 MG PO TABS: ORAL_TABLET | ORAL | 3 refills | Status: DC

## 2020-10-27 MED ORDER — LORAZEPAM 1 MG PO TABS: ORAL_TABLET | ORAL | 1 refills | Status: DC

## 2020-10-27 MED ORDER — HYDROCHLOROTHIAZIDE 25 MG PO TABS: ORAL_TABLET | ORAL | 3 refills | Status: DC

## 2020-10-27 NOTE — Progress Notes (Signed)
OFFICE VISIT  10/27/2020  CC:  Chief Complaint  Patient presents with  . Follow-up    Hypertension, anxiety   HPI:    Patient is a 45 y.o. Caucasian female who presents for 3 wk f/u HTN, GAD/depr, and alcoholism. A/P as of last visit: "1) Alcoholism, recent relapse. Now abstinent x 1 wk, used some loraz to prevent/treat w/drawal. No new rx needed for this med. She is getting back into regular attendance at [employer] and is in contact with her sponsor.  2) GAD, hx of MDD. Anxiety is sub-optimally controlled.  I think getting this problem better will help increase her chance of prolonged abstinence from alcohol. Increase sertraline to 200mg  qd.  Continue bupropion xl 150mg  qd.  OK to continue to take loraz 1mg  tid PRN severe anxiety. If she stays on this med over the next couple of months and follows up appropriately then I'll get CSC in chart, UDS as well.  3) Uncontrolled HTN: add irbesartan 75mg , cont hctz 25 qd and coreg 25 bid. Try to monitor bp some at home or pharmacy. Recheck in office 2 wks and check bmet at that time as well."  INTERIM HX: Increased sertraline to 200 mg last visit and added irbesartan 75 as well.  Felt "weird" on higher dose of sertaline so reverted back to 100mg  qd dosing. Takes loraz once daily, helps decently well but takes >1 hour to help much. Still struggling same with anxiety, near panic at times, has NOT reverted back to alc use though.  BP's up still, 140-150s syst at times, 80s diast.   Her cuff checked against ours today--->hers read 10 points higher on syst and 4 points higher diastolic.  Says no epigastric pain or GERD.  Feels mild globus sensation occ but NOTHING like before---this is much better.  ROS: no fevers, no CP, no SOB, no wheezing, no cough, no dizziness, no HAs, no rashes, no melena/hematochezia.  No polyuria or polydipsia.  No myalgias or arthralgias.  No focal weakness, paresthesias, or tremors.  No acute vision or hearing  abnormalities. No n/v/d or abd pain.  No palpitations.     Home bp monitoring Past Medical History:  Diagnosis Date  . Alcoholism (HCC)    Ongoing (many years) of abuse until 05/17/2019 admission for detox.  Anxiety   . Elevated transaminase level    likely secondary to alcohol.  Viral Hep screens normal.  . Hypertension   . Left ankle sprain 09/13/2017    Priority care: x-ray NORMAL 09/13/17.  . Rotator cuff tendonitis, right 2021   recalcitrant ->MRI 12/2019--tiny low grade partial thickness bursal region teres minor tear  . Tobacco dependence    ongoing as of 05/2019    Past Surgical History:  Procedure Laterality Date  . BREAST BIOPSY Right 07/2018   benign (fibroadenoma)  . CATARACT EXTRACTION W/ INTRAOCULAR LENS  IMPLANT, BILATERAL  2008 and 2010  . CERVICAL CONE BIOPSY     for HPV  . WISDOM TOOTH EXTRACTION      Outpatient Medications Prior to Visit  Medication Sig Dispense Refill  . buPROPion (WELLBUTRIN XL) 150 MG 24 hr tablet TAKE 1 TABLET BY MOUTH EVERY DAY IN THE MORNING 90 tablet 1  . norethindrone (MICRONOR) 0.35 MG tablet Take 1 tablet by mouth daily.    . pantoprazole (PROTONIX) 40 MG tablet TAKE 1 TABLET BY MOUTH EVERY DAY 90 tablet 3  . traZODone (DESYREL) 100 MG tablet TAKE 1 TABLET (100 MG TOTAL) BY MOUTH AT  BEDTIME AS NEEDED FOR SLEEP. 90 tablet 1  . carvedilol (COREG) 25 MG tablet TAKE 1 TABLET BY MOUTH 2 TIMES DAILY WITH A MEAL. 60 tablet 0  . hydrochlorothiazide (HYDRODIURIL) 25 MG tablet TAKE 1 TABLET BY MOUTH EVERY DAY IN THE MORNING 30 tablet 0  . hydrOXYzine (ATARAX/VISTARIL) 10 MG tablet TAKE 1 TABLET (10 MG TOTAL) BY MOUTH 3 (THREE) TIMES DAILY AS NEEDED FOR ANXIETY. 270 tablet 1  . irbesartan (AVAPRO) 75 MG tablet Take 1 tablet (75 mg total) by mouth daily. 30 tablet 0  . LORazepam (ATIVAN) 1 MG tablet 1 tab po tid 30 tablet 0  . sertraline (ZOLOFT) 100 MG tablet 2 tabs po qd 60 tablet 3   No facility-administered medications prior to  visit.    Allergies  Allergen Reactions  . Lisinopril     Angioedema  . Epinephrine Other (See Comments)    Tachycardia, dizziness  . Sulfa Antibiotics      ? reaction    ROS As per HPI  PE: Vitals with BMI 10/27/2020 10/05/2020 09/27/2020  Height 5\' 5"  5\' 5"  5\' 5"   Weight 172 lbs 10 oz 178 lbs 6 oz 177 lbs 10 oz  BMI 28.72 29.69 29.55  Systolic 133 151  Diastolic 91 102 95  Pulse 82 78 97     Gen: Alert, well appearing.  Patient is oriented to person, place, time, and situation. AFFECT: pleasant, lucid thought and speech.  Tearful at times, definitely high strung as usual.   LABS:  Lab Results  Component Value Date   TSH 1.75 07/29/2019   Lab Results  Component Value Date   WBC 12.8 (H) 09/23/2020   HGB 15.2 (H) 09/23/2020   HCT 42.9 09/23/2020   MCV 95.3 09/23/2020   PLT 343 09/23/2020   Lab Results  Component Value Date   CREATININE 0.60 09/23/2020   BUN 7 09/23/2020   NA 132 (L) 09/23/2020   K 4.0 09/23/2020   CL 96 (L) 09/23/2020   CO2 24 09/23/2020   Lab Results  Component Value Date   ALT 19 09/23/2020   AST 27 09/23/2020   ALKPHOS 60 09/23/2020   BILITOT 1.0 09/23/2020   Lab Results  Component Value Date   CHOL 174 11/22/2019   Lab Results  Component Value Date   HDL 39.30 11/22/2019   Lab Results  Component Value Date   LDLCALC 122 (H) 11/22/2019   Lab Results  Component Value Date   TRIG 63.0 11/22/2019   Lab Results  Component Value Date   CHOLHDL 4 11/22/2019   Lab Results  Component Value Date   HGBA1C 5.4 10/10/2014   IMPRESSION AND PLAN:  1) GAD/MDD: struggling but doing good to stay off alcohol. 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 Cont wellbutrin xl 150 qd, sertraline 100 qd, and will have her start taking her lorazepam 1mg  tab bid on scheduled basis.  Still ok to take trazodone prn hs for sleep.  2) HTN: some of her poor  control is tied to her high anxiety, which we're working on. Inc irbesartan to 150mg  qd, cont coreg 25mg  bid and hctz 25mg  qd. BMET today.  3) Dyspepsia and GERD: doing great on pantoprazole 40mg  qd and OFF alcohol. Globus sensation problem is resolving appropriately.  An After Visit Summary was printed and given to the patient.  FOLLOW UP: Return in about 4 weeks (around 11/24/2020) for  routine chronic illness f/u.  Signed:  Santiago Bumpers, MD           10/27/2020

## 2020-11-18 ENCOUNTER — Other Ambulatory Visit: Payer: Self-pay | Admitting: Family Medicine

## 2020-11-24 ENCOUNTER — Ambulatory Visit: Payer: BC Managed Care – PPO | Admitting: Family Medicine

## 2020-12-08 ENCOUNTER — Other Ambulatory Visit: Payer: Self-pay | Admitting: Family Medicine

## 2020-12-08 NOTE — Telephone Encounter (Signed)
RF request for Trazodone LOV:10/27/20 Next ov: advised to f/u 4 weeks Last written: 02/18/20 (90,1)  Please advise. Medication pending

## 2020-12-20 ENCOUNTER — Other Ambulatory Visit: Payer: Self-pay | Admitting: Family Medicine

## 2021-01-12 ENCOUNTER — Ambulatory Visit: Payer: BC Managed Care – PPO | Admitting: Family Medicine

## 2021-01-26 DIAGNOSIS — M79671 Pain in right foot: Secondary | ICD-10-CM | POA: Diagnosis not present

## 2021-03-16 ENCOUNTER — Telehealth: Payer: Self-pay

## 2021-03-16 ENCOUNTER — Other Ambulatory Visit: Payer: Self-pay | Admitting: Family Medicine

## 2021-03-16 NOTE — Telephone Encounter (Signed)
Patient wanted to wait to be seen in office.   Nurse Assessment Nurse: Clarita Leber, RN, Deborah Date/Time (Eastern Time): 03/16/2021 1:21:31 PM Confirm and document reason for call. If symptomatic, describe symptoms. ---Caller states that she has been experiencing finger and toes tingling and pins and needles. Does the patient have any new or worsening symptoms? ---Yes Will a triage be completed? ---Yes Related visit to physician within the last 2 weeks? ---No Does the PT have any chronic conditions? (i.e. diabetes, asthma, this includes High risk factors for pregnancy, etc.) ---Yes List chronic conditions. ---hypertension Is the patient pregnant or possibly pregnant? (Ask all females between the ages of 45-55) ---No Is this a behavioral health or substance abuse call? ---No Guidelines Guideline Title Affirmed Question Affirmed Notes Nurse Date/Time (Eastern Time) Neurologic Deficit [1] Tingling (e.g., pins and needles) of the face, arm / hand, or leg / foot on one side of the body AND [2] present now (Exceptions: chronic/recurrent symptom lasting > 4 weeks or tingling Womble, RN, Gavin Pound 03/16/2021 1:23:17 PM PLEASE NOTE: All timestamps contained within this report are represented as [place] Standard Time. CONFIDENTIALTY NOTICE: This fax transmission is intended only for the addressee. It contains information that is legally privileged, confidential or otherwise protected from use or disclosure. If you are not the intended recipient, you are strictly prohibited from reviewing, disclosing, copying using or disseminating any of this information or taking any action in reliance on or regarding this information. If you have received this fax in error, please notify us immediately by telephone so that we can arrange for its return to [place]. Phone: [phone], Toll-Free: [phone], Fax: [phone] Page: 2 of 2 Call Id: [id number] Guidelines Guideline Title Affirmed Question Affirmed  Notes Nurse Date/Time Lamount Cohen Time) from known cause, such as: bumped elbow, carpal tunnel syndrome, pinched nerve, frostbite) Disp. Time Lamount Cohen Time) Disposition Final User 03/16/2021 1:30:36 PM See HCP within 4 Hours (or PCP triage) Yes Clarita Leber, RN, Jetty Duhamel Disagree/Comply Comply Caller Understands Yes PreDisposition Call Doctor Care Advice Given Per Guideline SEE HCP (OR PCP TRIAGE) WITHIN 4 HOURS: Comments User: Alita Chyle, RN Date/Time Lamount Cohen Time): 03/16/2021 1:34:06 PM The caller was given the outcome to be seen in the next 4 hours and this nurse called the backline per directives and no one answered. Called the mainline and there were 3 callers in line. Put that on hold and spoke with the caller and instructed her to tell the office the time line and if they can't see her that she needs to go to the ED and she states that her husband would be mad. This nurse explained that a neurological problem needs to be ruled out and that she should go to be evaluated and she verbalized understanding. Referrals REFERRED TO PCP OFFIC

## 2021-03-16 NOTE — Telephone Encounter (Signed)
Requesting: lorazepam Contract: 01/28/18 UDS: n/a Last Visit: 10/27/20 Next Visit: 03/19/21 Last Refill: 10/27/20(60,1)  Please Advise. Medication pending

## 2021-03-16 NOTE — Telephone Encounter (Signed)
FYI. Please see below. Appt on 6/6

## 2021-03-16 NOTE — Telephone Encounter (Signed)
Noted  

## 2021-03-19 ENCOUNTER — Other Ambulatory Visit: Payer: Self-pay

## 2021-03-19 ENCOUNTER — Encounter: Payer: Self-pay | Admitting: Family Medicine

## 2021-03-19 ENCOUNTER — Ambulatory Visit: Payer: BC Managed Care – PPO | Admitting: Family Medicine

## 2021-03-19 VITALS — BP 112/77 | HR 94 | Temp 98.0°F | Resp 16 | Ht 65.0 in | Wt 161.0 lb

## 2021-03-19 DIAGNOSIS — F1020 Alcohol dependence, uncomplicated: Secondary | ICD-10-CM | POA: Diagnosis not present

## 2021-03-19 DIAGNOSIS — F411 Generalized anxiety disorder: Secondary | ICD-10-CM

## 2021-03-19 DIAGNOSIS — N926 Irregular menstruation, unspecified: Secondary | ICD-10-CM

## 2021-03-19 DIAGNOSIS — I10 Essential (primary) hypertension: Secondary | ICD-10-CM

## 2021-03-19 DIAGNOSIS — R202 Paresthesia of skin: Secondary | ICD-10-CM | POA: Diagnosis not present

## 2021-03-19 DIAGNOSIS — F331 Major depressive disorder, recurrent, moderate: Secondary | ICD-10-CM

## 2021-03-19 DIAGNOSIS — N921 Excessive and frequent menstruation with irregular cycle: Secondary | ICD-10-CM

## 2021-03-19 LAB — VITAMIN B12: Vitamin B-12: 302 pg/mL (ref 211–911)

## 2021-03-19 LAB — POCT URINE PREGNANCY: Preg Test, Ur: NEGATIVE

## 2021-03-19 NOTE — Progress Notes (Signed)
OFFICE VISIT  03/19/2021  CC:  Chief Complaint  Patient presents with  . Numbness in fingertips, toes    Comes and goes for week and a half; pens and needles in her head for a week now.   HPI:    Patient is a 45 y.o. Caucasian female who presents for "fingertips go numb". I last saw her 5 months ago. A/P as of that visit: "1) GAD/MDD: struggling but doing good to stay off alcohol. 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 Cont wellbutrin xl 150 qd, sertraline 100 qd, and will have her start taking her lorazepam 1mg  tab bid on scheduled basis.  Still ok to take trazodone prn hs for sleep.  2) HTN: some of her poor control is tied to her high anxiety, which we're working on. Inc irbesartan to 150mg  qd, cont coreg 25mg  bid and hctz 25mg  qd. BMET today.  3) Dyspepsia and GERD: doing great on pantoprazole 40mg  qd and OFF alcohol. Globus sensation problem is resolving appropriately."  INTERIM HX: Onset approx 10d ago, pins and needles sensation in fingers (mainly pinkies) and toes (she can't be sure if feet or just toes), comes and goes. Has been drinking ETOH last couple months again.  Seems like restart was triggered by anxiety about her 15 y/o daughter.  Husband usually not supportive, says her struggles are simply a matter of will power. Anxiety very bad, panicky at times.  She's afraid to stop drinking now b/c she's afraid she'll have a withdrawal seizure (has never had one before, though).  Drinks 3 small boxes of wine daily, sometimes more. Her AA sponsor dropped her. Sometimes her paresthesias are when she has high anxiety, sometimes when "fine". She is quite depressed, says she has to keep getting up every day but doesn't want to.  Denies SI or HI. + feels hopeless, +anhedonia, +guilty.    She is NOT taking her lorazepam b/c she knows it could interact adversely with alcohol.   ROS as above, plus-->  no fevers, no CP, no SOB, no wheezing, no cough, no dizziness, no HAs, no rashes, no melena/hematochezia.  No polyuria or polydipsia.  No myalgias or arthralgias.  No focal weakness, paresthesias, or tremors.  No acute vision or hearing abnormalities.  No dysuria or unusual/new urinary urgency or frequency.  No recent changes in lower legs. No n/v/d or abd pain.  No palpitations.    Menses have been irregular, last one was about 2 mo ago, she asks for pregnancy testing today.  PMP AWARE reviewed today: most recent rx for lorazepam 1 mg was filled 12/29/20, # 60, rx by me. No red flags.    Past Medical History:  Diagnosis Date  . Alcoholism (HCC)    Ongoing (many years) of abuse until 05/17/2019 admission for detox.  Anxiety   . Elevated transaminase level    likely secondary to alcohol.  Viral Hep screens normal.  . Hypertension   . Left ankle sprain 09/13/2017   [place] Priority care: x-ray NORMAL 09/13/17.  . Rotator cuff tendonitis, right 2021   recalcitrant ->MRI 12/2019--tiny low grade partial thickness bursal region teres minor tear  . Tobacco dependence    ongoing as of 05/2019    Past Surgical History:  Procedure Laterality Date  . BREAST BIOPSY Right 07/2018   benign (fibroadenoma)  . CATARACT EXTRACTION W/ INTRAOCULAR LENS  IMPLANT, BILATERAL  2008 and 2010  . CERVICAL  CONE BIOPSY     for HPV  . WISDOM TOOTH EXTRACTION      Outpatient Medications Prior to Visit  Medication Sig Dispense Refill  . buPROPion (WELLBUTRIN XL) 150 MG 24 hr tablet TAKE 1 TABLET BY MOUTH EVERY DAY IN THE MORNING 90 tablet 1  . carvedilol (COREG) 25 MG tablet 1 tab po bid 180 tablet 3  . hydrochlorothiazide (HYDRODIURIL) 25 MG tablet TAKE 1 TABLET BY MOUTH EVERY DAY IN THE MORNING 90 tablet 3  . irbesartan (AVAPRO) 150 MG tablet TAKE 1 TABLET BY MOUTH EVERY DAY 30 tablet 5  . LORazepam (ATIVAN) 1 MG tablet TAKE 1 TABLET BY MOUTH TWICE A DAY 60 tablet 1  . norethindrone (MICRONOR) 0.35 MG  tablet Take 1 tablet by mouth daily.    . pantoprazole (PROTONIX) 40 MG tablet TAKE 1 TABLET BY MOUTH EVERY DAY 90 tablet 3  . sertraline (ZOLOFT) 100 MG tablet 1 tab po qd 90 tablet 3  . traZODone (DESYREL) 100 MG tablet TAKE 1 TABLET (100 MG TOTAL) BY MOUTH AT BEDTIME AS NEEDED FOR SLEEP. 90 tablet 1   No facility-administered medications prior to visit.    Allergies  Allergen Reactions  . Lisinopril     Angioedema  . Epinephrine Other (See Comments)    Tachycardia, dizziness  . Sulfa Antibiotics      ? reaction    ROS As per HPI  PE: Vitals with BMI 03/19/2021 10/27/2020 10/05/2020  Height 5\' 5"  5\' 5"  5\' 5"   Weight 161 lbs 172 lbs 10 oz 178 lbs 6 oz  BMI 26.79 28.72 29.69  Systolic 112 133  Diastolic 77 91 102  Pulse 94 82 78     Gen: Alert, well appearing.  Patient is oriented to person, place, time, and situation. AFFECT: pleasant, anxious, tearful, lucid thought and speech. Neuro: CN 2-12 intact bilaterally, strength 5/5 in proximal and distal upper extremities and lower extremities bilaterally.  No sensory deficits.  Mild tremulousness UEs bilat.   No ataxia.  Tinel's at elbow and wrist neg.  Phalen's neg. No color changes of hands/fingers.  LABS:    Chemistry      Component Value Date/Time   NA 131 (L) 10/27/2020 1151   K 4.0 10/27/2020 1151   CL 94 (L) 10/27/2020 1151   CO2 26 10/27/2020 1151   BUN 10 10/27/2020 1151   CREATININE 0.64 10/27/2020 1151      Component Value Date/Time   CALCIUM 9.5 10/27/2020 1151   ALKPHOS 60 09/23/2020 1253   AST 27 09/23/2020 1253   ALT 19 09/23/2020 1253   BILITOT 1.0 09/23/2020 1253     Lab Results  Component Value Date   WBC 12.8 (H) 09/23/2020   HGB 15.2 (H) 09/23/2020   HCT 42.9 09/23/2020   MCV 95.3 09/23/2020   PLT 343 09/23/2020   Lab Results  Component Value Date   TSH 1.75 07/29/2019   Lab Results  Component Value Date   CHOL 174 11/22/2019   HDL 39.30 11/22/2019   LDLCALC 122 (H) 11/22/2019    TRIG 63.0 11/22/2019   CHOLHDL 4 11/22/2019   Lab Results  Component Value Date   VITAMINB12 306 07/28/2013   POC UPT-NEG  IMPRESSION AND PLAN:  1) Paresthesias: suspect related to acute on chronic severe anxiety, alcoholism with recent relapse. Will check vit B12 and cmet.  2) MDD/GAD: worse lately with alcohol relapse, denies SI or HI but she's feeling horrible. I encouraged her today to  contact counselor (referral ordered).  No med changes in the context of ongoing ETOH abuse.    3) Alcoholism, relapse.  She is considering cutting back slowly but she's too afraid to quit abruptly and she declines going to get detox or go back to rehab b/c of fear that her husband will be angry, plus she worries about finances/not working, etc.  She says she is not fearful for her safety at home. I told her if she feels suicidal thoughts or homicidal thoughts that she should go to ED or call 911 or call our office.    4) Irreg menses: UPT neg today.  Suspect due to perimenopausal status + excessive psychosocial/emotional stress.  An After Visit Summary was printed and given to the patient.  FOLLOW UP: Return in about 1 week (around 03/26/2021) for f/u dep.  Signed:  Santiago Bumpers, MD           03/19/2021

## 2021-03-19 NOTE — Telephone Encounter (Signed)
Pt had appt today and has picked up medication

## 2021-03-20 ENCOUNTER — Other Ambulatory Visit: Payer: Self-pay | Admitting: Family Medicine

## 2021-03-20 LAB — COMPREHENSIVE METABOLIC PANEL
ALT: 76 U/L — ABNORMAL HIGH (ref 0–35)
AST: 79 U/L — ABNORMAL HIGH (ref 0–37)
Albumin: 4.4 g/dL (ref 3.5–5.2)
Alkaline Phosphatase: 63 U/L (ref 39–117)
BUN: 5 mg/dL — ABNORMAL LOW (ref 6–23)
CO2: 23 mEq/L (ref 19–32)
Calcium: 9.4 mg/dL (ref 8.4–10.5)
Chloride: 92 mEq/L — ABNORMAL LOW (ref 96–112)
Creatinine, Ser: 0.57 mg/dL (ref 0.40–1.20)
GFR: 110.04 mL/min (ref >60.00)
Glucose, Bld: 97 mg/dL (ref 70–99)
Potassium: 3.9 mEq/L (ref 3.5–5.1)
Sodium: 130 mEq/L — ABNORMAL LOW (ref 135–145)
Total Bilirubin: 0.3 mg/dL (ref 0.2–1.2)
Total Protein: 7.2 g/dL (ref 6.0–8.3)

## 2021-03-25 ENCOUNTER — Other Ambulatory Visit: Payer: Self-pay | Admitting: Family Medicine

## 2021-03-26 ENCOUNTER — Encounter: Payer: Self-pay | Admitting: Family Medicine

## 2021-03-26 ENCOUNTER — Ambulatory Visit: Payer: BC Managed Care – PPO | Admitting: Family Medicine

## 2021-03-26 ENCOUNTER — Other Ambulatory Visit: Payer: Self-pay

## 2021-03-26 VITALS — BP 106/71 | HR 85 | Temp 97.8°F | Ht 65.0 in | Wt 159.4 lb

## 2021-03-26 DIAGNOSIS — I10 Essential (primary) hypertension: Secondary | ICD-10-CM

## 2021-03-26 DIAGNOSIS — F331 Major depressive disorder, recurrent, moderate: Secondary | ICD-10-CM | POA: Diagnosis not present

## 2021-03-26 DIAGNOSIS — F411 Generalized anxiety disorder: Secondary | ICD-10-CM

## 2021-03-26 DIAGNOSIS — F1020 Alcohol dependence, uncomplicated: Secondary | ICD-10-CM | POA: Diagnosis not present

## 2021-03-26 NOTE — Progress Notes (Signed)
OFFICE VISIT  03/26/2021  CC:  Chief Complaint  Patient presents with   Follow-up    Depression    HPI:    Patient is a 45 y.o. Caucasian female who presents accompanied by her teenage daughter for 1 wk f/u MDD/GAD, HTN, and alcoholism. A/P as of last visit: "1) Paresthesias: suspect related to acute on chronic severe anxiety, alcoholism with recent relapse. Will check vit B12 and cmet.   2) MDD/GAD: worse lately with alcohol relapse, denies SI or HI but she's feeling horrible. I encouraged her today to contact counselor (referral ordered).  No med changes in the context of ongoing ETOH abuse.     3) Alcoholism, relapse.  She is considering cutting back slowly but she's too afraid to quit abruptly and she declines going to get detox or go back to rehab b/c of fear that her husband will be angry, plus she worries about finances/not working, etc.  She says she is not fearful for her safety at home. I told her if she feels suicidal thoughts or homicidal thoughts that she should go to ED or call 911 or call our office.     4) Irreg menses: UPT neg today.  Suspect due to perimenopausal status + excessive psychosocial/emotional stress."  INTERIM HX: Labs last week showed normal vit b12 level, mildly elevated LFTs and Na 130->suspected all due to her alcohol abuse lately.  Pt has her daughter with her today and therefore could not talk very openly about things. She does indicate that she has cut back on ETOH some over the last week, anx/dep a little better as a result. No SI or HI.   She's compliant with meds but does not take ativan at this time b/c she avoids this med when she is drinking alcohol. Counseling appt plan didn't work out b/c only openings are far in future. She is wondering about getting back on naltrexone and asks for psychiatrist referral.   HTN: No home bp monitoring.  Takes coreg 25mg  bid, irbesartan 150 qd, and hctz 25 qd.          Past Medical History:   Diagnosis Date   Alcoholism (HCC)    Ongoing (many years) of abuse until 05/17/2019 admission for detox.   Anxiety    Elevated transaminase level    likely secondary to alcohol.  Viral Hep screens normal.   Hypertension    Left ankle sprain 09/13/2017   [place] Priority care: x-ray NORMAL 09/13/17.   Rotator cuff tendonitis, right 2021   recalcitrant ->MRI 12/2019--tiny low grade partial thickness bursal region teres minor tear   Tobacco dependence    ongoing as of 05/2019    Past Surgical History:  Procedure Laterality Date   BREAST BIOPSY Right 07/2018   benign (fibroadenoma)   CATARACT EXTRACTION W/ INTRAOCULAR LENS  IMPLANT, BILATERAL  2008 and 2010   CERVICAL CONE BIOPSY     for HPV   WISDOM TOOTH EXTRACTION      Outpatient Medications Prior to Visit  Medication Sig Dispense Refill   buPROPion (WELLBUTRIN XL) 150 MG 24 hr tablet TAKE 1 TABLET BY MOUTH EVERY DAY IN THE MORNING 90 tablet 1   carvedilol (COREG) 25 MG tablet 1 tab po bid 180 tablet 3   hydrochlorothiazide (HYDRODIURIL) 25 MG tablet TAKE 1 TABLET BY MOUTH EVERY DAY IN THE MORNING 90 tablet 3   irbesartan (AVAPRO) 150 MG tablet TAKE 1 TABLET BY MOUTH EVERY DAY 30 tablet 5   LORazepam (ATIVAN) 1  MG tablet TAKE 1 TABLET BY MOUTH TWICE A DAY 60 tablet 1   norethindrone (MICRONOR) 0.35 MG tablet Take 1 tablet by mouth daily.     pantoprazole (PROTONIX) 40 MG tablet TAKE 1 TABLET BY MOUTH EVERY DAY 90 tablet 3   sertraline (ZOLOFT) 100 MG tablet 1 tab po qd 90 tablet 3   traZODone (DESYREL) 100 MG tablet TAKE 1 TABLET (100 MG TOTAL) BY MOUTH AT BEDTIME AS NEEDED FOR SLEEP. 90 tablet 1   No facility-administered medications prior to visit.    Allergies  Allergen Reactions   Lisinopril     Angioedema   Epinephrine Other (See Comments)    Tachycardia, dizziness   Sulfa Antibiotics      ? reaction    ROS As per HPI  PE: Vitals with BMI 03/26/2021 03/19/2021 10/27/2020  Height 5\' 5"  5\' 5"  5\' 5"   Weight 159 lbs  6 oz 161 lbs 172 lbs 10 oz  BMI 26.53 26.79 28.72  Systolic 106 112  Diastolic 71 77 91  Pulse 85 94 82   Gen: Alert, well appearing.  Patient is oriented to person, place, time, and situation. AFFECT: pleasant, lucid thought and speech. CV: RRR, no m/r/g.   LUNGS: CTA bilat, nonlabored resps, good aeration in all lung fields. EXT: no clubbing or cyanosis.  no edema.  No jaundice or pallor. She is mildly tremulous in hands when arms held outstretched. Otherwise neuro exam normal.   LABS:    Chemistry      Component Value Date/Time   NA 130 (L) 03/19/2021 1017   K 3.9 03/19/2021 1017   CL 92 (L) 03/19/2021 1017   CO2 23 03/19/2021 1017   BUN 5 (L) 03/19/2021 1017   CREATININE 0.57 03/19/2021 1017      Component Value Date/Time   CALCIUM 9.4 03/19/2021 1017   ALKPHOS 63 03/19/2021 1017   AST 79 (H) 03/19/2021 1017   ALT 76 (H) 03/19/2021 1017   BILITOT 0.3 03/19/2021 1017     Lab Results  Component Value Date   VITAMINB12 302 03/19/2021    IMPRESSION AND PLAN:  1) Alcoholism, ongoing abuse.  Trying to cut back slowly. Per pt request I ordered referral to psychiatry with [facility] today for consideration of getting back on naltrexone. LFTs up some last week,similar to levels when she was drinking heavily back in 2020. These normalized when she quit drinking for a while.  2) GAD and recurrent MDD, moderate episode.  Slightly improved over the last week. Cont sertraline 100 mg qd and wellbutrin xl 150 qd.  3) HTN: great bp here today. Lytes/cr good 1 wk ago.  An After Visit Summary was printed and given to the patient.  FOLLOW UP: Return in about 4 weeks (around 04/23/2021) for f/u ETOH, anx/dep, HTN.  Signed:  [facility], MD           03/26/2021

## 2021-04-25 ENCOUNTER — Ambulatory Visit: Payer: BC Managed Care – PPO | Admitting: Family Medicine

## 2021-04-25 NOTE — Progress Notes (Deleted)
OFFICE VISIT  04/25/2021  CC: No chief complaint on file.   HPI:    Patient is a 45 y.o. Caucasian female who presents for 1 month f/u HTN, MDD/anxiety, and alcoholism. A/P as of last visit: "1) Alcoholism, ongoing abuse.  Trying to cut back slowly. Per pt request I ordered referral to psychiatry with [facility] today for consideration of getting back on naltrexone. LFTs up some last week,similar to levels when she was drinking heavily back in 2020. These normalized when she quit drinking for a while.   2) GAD and recurrent MDD, moderate episode.  Slightly improved over the last week. Cont sertraline 100 mg qd and wellbutrin xl 150 qd.   3) HTN: great bp here today. Lytes/cr good 1 wk ago."  INTERIM HX: ***    Past Medical History:  Diagnosis Date   Alcoholism (HCC)    Ongoing (many years) of abuse until 05/17/2019 admission for detox.   Anxiety    Elevated transaminase level    likely secondary to alcohol.  Viral Hep screens normal.   Hypertension    Left ankle sprain 09/13/2017   [place] Priority care: x-ray NORMAL 09/13/17.   Rotator cuff tendonitis, right 2021   recalcitrant ->MRI 12/2019--tiny low grade partial thickness bursal region teres minor tear   Tobacco dependence    ongoing as of 05/2019    Past Surgical History:  Procedure Laterality Date   BREAST BIOPSY Right 07/2018   benign (fibroadenoma)   CATARACT EXTRACTION W/ INTRAOCULAR LENS  IMPLANT, BILATERAL  2008 and 2010   CERVICAL CONE BIOPSY     for HPV   WISDOM TOOTH EXTRACTION      Outpatient Medications Prior to Visit  Medication Sig Dispense Refill   buPROPion (WELLBUTRIN XL) 150 MG 24 hr tablet TAKE 1 TABLET BY MOUTH EVERY DAY IN THE MORNING 90 tablet 1   carvedilol (COREG) 25 MG tablet 1 tab po bid 180 tablet 3   hydrochlorothiazide (HYDRODIURIL) 25 MG tablet TAKE 1 TABLET BY MOUTH EVERY DAY IN THE MORNING 90 tablet 3   irbesartan (AVAPRO) 150 MG tablet TAKE 1 TABLET BY MOUTH EVERY DAY 30 tablet 5    LORazepam (ATIVAN) 1 MG tablet TAKE 1 TABLET BY MOUTH TWICE A DAY 60 tablet 1   norethindrone (MICRONOR) 0.35 MG tablet Take 1 tablet by mouth daily.     pantoprazole (PROTONIX) 40 MG tablet TAKE 1 TABLET BY MOUTH EVERY DAY 90 tablet 3   sertraline (ZOLOFT) 100 MG tablet 1 tab po qd 90 tablet 3   traZODone (DESYREL) 100 MG tablet TAKE 1 TABLET (100 MG TOTAL) BY MOUTH AT BEDTIME AS NEEDED FOR SLEEP. 90 tablet 1   No facility-administered medications prior to visit.    Allergies  Allergen Reactions   Lisinopril     Angioedema   Epinephrine Other (See Comments)    Tachycardia, dizziness   Sulfa Antibiotics      ? reaction    ROS As per HPI  PE: Vitals with BMI 03/26/2021 03/19/2021 10/27/2020  Height 5\' 5"  5\' 5"  5\' 5"   Weight 159 lbs 6 oz 161 lbs 172 lbs 10 oz  BMI 26.53 26.79 28.72  Systolic 106 112  Diastolic 71 77 91  Pulse 85 94 82     ***  LABS:  Lab Results  Component Value Date   TSH 1.75 07/29/2019   Lab Results  Component Value Date   WBC 12.8 (H) 09/23/2020   HGB 15.2 (H) 09/23/2020  HCT 42.9 09/23/2020   MCV 95.3 09/23/2020   PLT 343 09/23/2020   Lab Results  Component Value Date   CREATININE 0.57 03/19/2021   BUN 5 (L) 03/19/2021   NA 130 (L) 03/19/2021   K 3.9 03/19/2021   CL 92 (L) 03/19/2021   CO2 23 03/19/2021   Lab Results  Component Value Date   ALT 76 (H) 03/19/2021   AST 79 (H) 03/19/2021   ALKPHOS 63 03/19/2021   BILITOT 0.3 03/19/2021   Lab Results  Component Value Date   CHOL 174 11/22/2019   Lab Results  Component Value Date   HDL 39.30 11/22/2019   Lab Results  Component Value Date   LDLCALC 122 (H) 11/22/2019   Lab Results  Component Value Date   TRIG 63.0 11/22/2019   Lab Results  Component Value Date   CHOLHDL 4 11/22/2019   Lab Results  Component Value Date   HGBA1C 5.4 10/10/2014    IMPRESSION AND PLAN:  No problem-specific Assessment & Plan notes found for this encounter.  ? Cmet  An After  Visit Summary was printed and given to the patient.  FOLLOW UP: No follow-ups on file.  Signed:  Santiago Bumpers, MD           04/25/2021

## 2021-04-27 ENCOUNTER — Other Ambulatory Visit: Payer: Self-pay | Admitting: Family Medicine

## 2021-05-26 ENCOUNTER — Other Ambulatory Visit: Payer: Self-pay | Admitting: Family Medicine

## 2021-06-27 ENCOUNTER — Other Ambulatory Visit: Payer: Self-pay | Admitting: Family Medicine

## 2021-07-05 ENCOUNTER — Other Ambulatory Visit: Payer: Self-pay | Admitting: Family Medicine

## 2021-07-06 NOTE — Telephone Encounter (Signed)
Needs f/u in next 1-2 mo.

## 2021-07-06 NOTE — Telephone Encounter (Signed)
RF request for pantoprazole LOV:03/26/21 Next ov: advised to f/u 4 weeks Last written: 05/28/21(14,0)   Requesting: lorazepam Contract: N/A UDS: 01/28/18 Last Visit:03/26/21 Next Visit: advised to f/u 4 weeks Last Refill:03/16/21(60,1)  Please Advise. Meds pending

## 2021-07-09 NOTE — Telephone Encounter (Signed)
Lmom for patient to call office to schedule f/u appt with Dr. Milinda Cave 1-2 months.

## 2021-07-10 NOTE — Telephone Encounter (Signed)
Spoke with pt to advise regarding f/u appt, appt scheduled 10/6

## 2021-07-19 ENCOUNTER — Ambulatory Visit: Payer: BC Managed Care – PPO | Admitting: Family Medicine

## 2021-07-19 ENCOUNTER — Encounter: Payer: Self-pay | Admitting: Family Medicine

## 2021-07-19 ENCOUNTER — Telehealth: Payer: Self-pay

## 2021-07-19 ENCOUNTER — Other Ambulatory Visit: Payer: Self-pay

## 2021-07-19 VITALS — BP 120/81 | HR 92 | Temp 98.1°F | Resp 16 | Ht 65.0 in | Wt 160.2 lb

## 2021-07-19 DIAGNOSIS — F339 Major depressive disorder, recurrent, unspecified: Secondary | ICD-10-CM

## 2021-07-19 DIAGNOSIS — I10 Essential (primary) hypertension: Secondary | ICD-10-CM | POA: Diagnosis not present

## 2021-07-19 DIAGNOSIS — R7401 Elevation of levels of liver transaminase levels: Secondary | ICD-10-CM

## 2021-07-19 DIAGNOSIS — F411 Generalized anxiety disorder: Secondary | ICD-10-CM

## 2021-07-19 DIAGNOSIS — F1020 Alcohol dependence, uncomplicated: Secondary | ICD-10-CM

## 2021-07-19 DIAGNOSIS — F1010 Alcohol abuse, uncomplicated: Secondary | ICD-10-CM

## 2021-07-19 LAB — COMPREHENSIVE METABOLIC PANEL
ALT: 16 U/L (ref 0–35)
AST: 26 U/L (ref 0–37)
Albumin: 4.3 g/dL (ref 3.5–5.2)
Alkaline Phosphatase: 63 U/L (ref 39–117)
BUN: 10 mg/dL (ref 6–23)
CO2: 24 mEq/L (ref 19–32)
Calcium: 9.2 mg/dL (ref 8.4–10.5)
Chloride: 101 mEq/L (ref 96–112)
Creatinine, Ser: 0.94 mg/dL (ref 0.40–1.20)
GFR: 73.35 mL/min (ref >60.00)
Glucose, Bld: 141 mg/dL — ABNORMAL HIGH (ref 70–99)
Potassium: 3.7 mEq/L (ref 3.5–5.1)
Sodium: 136 mEq/L (ref 135–145)
Total Bilirubin: 0.5 mg/dL (ref 0.2–1.2)
Total Protein: 7.2 g/dL (ref 6.0–8.3)

## 2021-07-19 MED ORDER — SERTRALINE HCL 100 MG PO TABS: 200.0000 mg | ORAL_TABLET | Freq: Every day | ORAL | 3 refills | Status: DC

## 2021-07-19 MED ORDER — IRBESARTAN 150 MG PO TABS: 150.0000 mg | ORAL_TABLET | Freq: Every day | ORAL | 3 refills | Status: DC

## 2021-07-19 NOTE — Addendum Note (Signed)
Addended by: Jeoffrey Massed on: 07/19/2021 02:31 PM   Modules accepted: Orders

## 2021-07-19 NOTE — Telephone Encounter (Signed)
Dr. Milinda Cave would like to know status of behavorial health referral and psychiatrist referral. I told him that both referrals entered in June 2022 were closed.  Patient is ready to move forward. Dr. Milinda Cave will be entering 2 new referral orders for patient so we can go forward with trying to get her scheduled with them.  Thank you for all that you do! Annabelle Harman

## 2021-07-19 NOTE — Progress Notes (Addendum)
OFFICE VISIT  07/19/2021  CC:  Chief Complaint  Patient presents with   Follow-up    RCI    HPI:    Patient is a 45 y.o. female who presents for f/u HTN, hx of recurrent MDD, GAD. Health has been complicated by alcoholism. I last saw her about 4 mo ago. A/P as of that visit: "1) Alcoholism, ongoing abuse.  Trying to cut back slowly. Per pt request I ordered referral to psychiatry with [facility] today for consideration of getting back on naltrexone. LFTs up some last week,similar to levels when she was drinking heavily back in 2020. These normalized when she quit drinking for a while.   2) GAD and recurrent MDD, moderate episode.  Slightly improved over the last week. Cont sertraline 100 mg qd and wellbutrin xl 150 qd.   3) HTN: great bp here today. Lytes/cr good 1 wk ago."  INTERIM HX: Still struggling, drinking 3 little boxes of wine daily still--drinks every night, not in daytime. 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  Depressed, says she has no plans to try quitting drinking. Still smoking.  Having probs with 58 y/o daughter--behavior issues. Also chronic stress of her younger daughter with NF-1.  Fortunately her recent testing was stable. Home bp monitoring: none She is compliant with all meds. She didn't make appt with psych b/c it was going to be too long before they could see her. Continues to take one 1mg  lorazepam at noon each day. We discussed the fact that she has to ween off this med b/c not safe to continue taking this med while abusing alcohol.  She understood and says she will comply--see a/P.  ROS as above, plus--> no fevers, no CP, no SOB, no wheezing, no cough, no dizziness, no HAs, no rashes, no melena/hematochezia.  No polyuria or polydipsia.  No myalgias or arthralgias.  No focal weakness, paresthesias, or tremors.  No acute vision or hearing abnormalities.  No dysuria or unusual/new  urinary urgency or frequency.  No recent changes in lower legs. No n/v/d or abd pain.  No palpitations.     PMP AWARE reviewed today: most recent rx for lorazepam was filled 07/06/21, # 60, rx by me. No red flags.  Past Medical History:  Diagnosis Date   Alcoholism (HCC)    Ongoing (many years) of abuse until 05/17/2019 admission for detox.   Anxiety    Elevated transaminase level    likely secondary to alcohol.  Viral Hep screens normal.   Hypertension    Left ankle sprain 09/13/2017   [place] Priority care: x-ray NORMAL 09/13/17.   Rotator cuff tendonitis, right 2021   recalcitrant ->MRI 12/2019--tiny low grade partial thickness bursal region teres minor tear   Tobacco dependence    ongoing as of 05/2019    Past Surgical History:  Procedure Laterality Date   BREAST BIOPSY Right 07/2018   benign (fibroadenoma)   CATARACT EXTRACTION W/ INTRAOCULAR LENS  IMPLANT, BILATERAL  2008 and 2010   CERVICAL CONE BIOPSY     for HPV   WISDOM TOOTH EXTRACTION      Outpatient Medications Prior to Visit  Medication Sig Dispense Refill   buPROPion (WELLBUTRIN XL) 150 MG 24 hr tablet TAKE 1 TABLET BY MOUTH EVERY DAY IN THE MORNING 90 tablet 1   carvedilol (COREG) 25 MG tablet 1 tab po bid 180 tablet 3   hydrochlorothiazide (HYDRODIURIL) 25 MG tablet TAKE  1 TABLET BY MOUTH EVERY DAY IN THE MORNING 90 tablet 3   LORazepam (ATIVAN) 1 MG tablet TAKE 1 TABLET BY MOUTH TWICE A DAY 60 tablet 1   norethindrone (MICRONOR) 0.35 MG tablet Take 1 tablet by mouth daily.     pantoprazole (PROTONIX) 40 MG tablet TAKE 1 TABLET BY MOUTH EVERY DAY 90 tablet 3   irbesartan (AVAPRO) 150 MG tablet TAKE 1 TABLET BY MOUTH EVERY DAY 30 tablet 0   sertraline (ZOLOFT) 100 MG tablet TAKE 2 TABLETS BY MOUTH EVERY DAY 28 tablet 0   traZODone (DESYREL) 100 MG tablet TAKE 1 TABLET (100 MG TOTAL) BY MOUTH AT BEDTIME AS NEEDED FOR SLEEP. (Patient not taking: Reported on 07/19/2021) 90 tablet 1   No facility-administered  medications prior to visit.    Allergies  Allergen Reactions   Lisinopril     Angioedema   Epinephrine Other (See Comments)    Tachycardia, dizziness   Sulfa Antibiotics      ? reaction    ROS As per HPI  PE: Vitals with BMI 07/19/2021 03/26/2021 03/19/2021  Height 5\' 5"  5\' 5"  5\' 5"   Weight 160 lbs 3 oz 159 lbs 6 oz 161 lbs  BMI 26.66 26.53 26.79  Systolic 120 106  Diastolic 81 71 77  Pulse 92 85 94     Gen: Alert, well appearing.  Patient is oriented to person, place, time, and situation. AFFECT: pleasant, lucid thought and speech. CV: RRR, no m/r/g.   LUNGS: CTA bilat, nonlabored resps, good aeration in all lung fields. EXT: no clubbing or cyanosis.  no edema.    LABS:    Chemistry      Component Value Date/Time   NA 130 (L) 03/19/2021 1017   K 3.9 03/19/2021 1017   CL 92 (L) 03/19/2021 1017   CO2 23 03/19/2021 1017   BUN 5 (L) 03/19/2021 1017   CREATININE 0.57 03/19/2021 1017      Component Value Date/Time   CALCIUM 9.4 03/19/2021 1017   ALKPHOS 63 03/19/2021 1017   AST 79 (H) 03/19/2021 1017   ALT 76 (H) 03/19/2021 1017   BILITOT 0.3 03/19/2021 1017     Lab Results  Component Value Date   TSH 1.75 07/29/2019   Lab Results  Component Value Date   WBC 12.8 (H) 09/23/2020   HGB 15.2 (H) 09/23/2020   HCT 42.9 09/23/2020   MCV 95.3 09/23/2020   PLT 343 09/23/2020   IMPRESSION AND PLAN:  1) HTN: well controlled on hctz 25 qd, coreg 25 bid and irbesartan 150 qd. Lytes/cr today.  2) Recurrent MDD, GAD, agoraphobia, hx of panic: she is struggling but admits her abusive alcohol drinking is the biggest problem impeding improvement in her anxiety/mood.  However, she states that alcohol is the only thing that makes her feel less anxiety.  Cont wellbutrin xl 150 qd and sertraline 100 qd.  Ween off lorazepam--see #3 below.  3) Alcoholism, current abuse, not contemplating quitting. She did well on naltrexone in the past.   Needs to set up psychiatrist  visit--new referral ordered today. In the meantime, she has to ween off lorazepam. Instructions: Buy a pill splitter. Take 1/2 of your lorazepam once a day for 20 days, then take 1/4 of a tab once daily for 20d, then take 1/4 of tab every other day.  Decrease intake from that point until you are completely off this medication.  An After Visit Summary was printed and given to the patient.  FOLLOW UP: Return in about 3 months (around 10/19/2021) for annual cpe at her convenience or 3 mo [facility]CI f/u, whichever comes first.  Signed:  Santiago Bumpers, MD           07/19/2021

## 2021-07-19 NOTE — Patient Instructions (Addendum)
Buy a pill splitter. Take 1/2 of your lorazepam once a day for 20 days, then take 1/4 of a tab once daily for 20d, then take 1/4 of tab every other day.  Decrease intake from that point until you are completely off this medication.  Contact Behavioral Health: [phone]

## 2021-08-08 ENCOUNTER — Encounter: Payer: BC Managed Care – PPO | Admitting: Family Medicine

## 2021-08-10 ENCOUNTER — Other Ambulatory Visit: Payer: Self-pay

## 2021-08-10 ENCOUNTER — Encounter: Payer: Self-pay | Admitting: Family Medicine

## 2021-08-10 ENCOUNTER — Ambulatory Visit (INDEPENDENT_AMBULATORY_CARE_PROVIDER_SITE_OTHER): Payer: BC Managed Care – PPO | Admitting: Family Medicine

## 2021-08-10 VITALS — BP 131/86 | HR 82 | Temp 97.8°F | Ht 65.0 in | Wt 163.2 lb

## 2021-08-10 DIAGNOSIS — Z0000 Encounter for general adult medical examination without abnormal findings: Secondary | ICD-10-CM | POA: Diagnosis not present

## 2021-08-10 DIAGNOSIS — I10 Essential (primary) hypertension: Secondary | ICD-10-CM | POA: Diagnosis not present

## 2021-08-10 DIAGNOSIS — Z23 Encounter for immunization: Secondary | ICD-10-CM

## 2021-08-10 DIAGNOSIS — Z1211 Encounter for screening for malignant neoplasm of colon: Secondary | ICD-10-CM | POA: Diagnosis not present

## 2021-08-10 LAB — TSH: TSH: 1.36 u[IU]/mL (ref 0.35–5.50)

## 2021-08-10 LAB — CBC WITH DIFFERENTIAL/PLATELET
Basophils Absolute: 0 10*3/uL (ref 0.0–0.1)
Basophils Relative: 0.5 % (ref 0.0–3.0)
Eosinophils Absolute: 0.1 10*3/uL (ref 0.0–0.7)
Eosinophils Relative: 1.5 % (ref 0.0–5.0)
HCT: 41 % (ref 36.0–46.0)
Hemoglobin: 13.6 g/dL (ref 12.0–15.0)
Lymphocytes Relative: 22.7 % (ref 12.0–46.0)
Lymphs Abs: 1.5 10*3/uL (ref 0.7–4.0)
MCHC: 33.2 g/dL (ref 30.0–36.0)
MCV: 105 fl — ABNORMAL HIGH (ref 78.0–100.0)
Monocytes Absolute: 0.5 10*3/uL (ref 0.1–1.0)
Monocytes Relative: 8 % (ref 3.0–12.0)
Neutro Abs: 4.6 10*3/uL (ref 1.4–7.7)
Neutrophils Relative %: 67.3 % (ref 43.0–77.0)
Platelets: 246 10*3/uL (ref 150.0–400.0)
RBC: 3.9 Mil/uL (ref 3.87–5.11)
RDW: 12.9 % (ref 11.5–15.5)
WBC: 6.8 10*3/uL (ref 4.0–10.5)

## 2021-08-10 LAB — LIPID PANEL
Cholesterol: 175 mg/dL (ref 0–200)
HDL: 65.4 mg/dL (ref >39.00)
LDL Cholesterol: 99 mg/dL (ref 0–99)
NonHDL: 109.99
Total CHOL/HDL Ratio: 3
Triglycerides: 54 mg/dL (ref 0.0–149.0)
VLDL: 10.8 mg/dL (ref 0.0–40.0)

## 2021-08-10 MED ORDER — NORETHINDRONE 0.35 MG PO TABS: 1.0000 | ORAL_TABLET | Freq: Every day | ORAL | 0 refills | Status: DC

## 2021-08-10 NOTE — Patient Instructions (Signed)
Health Maintenance, Female Adopting a healthy lifestyle and getting preventive care are important in promoting health and wellness. Ask your health care provider about: The right schedule for you to have regular tests and exams. Things you can do on your own to prevent diseases and keep yourself healthy. What should I know about diet, weight, and exercise? Eat a healthy diet  Eat a diet that includes plenty of vegetables, fruits, low-fat dairy products, and lean protein. Do not eat a lot of foods that are high in solid fats, added sugars, or sodium. Maintain a healthy weight Body mass index (BMI) is used to identify weight problems. It estimates body fat based on height and weight. Your health care provider can help determine your BMI and help you achieve or maintain a healthy weight. Get regular exercise Get regular exercise. This is one of the most important things you can do for your health. Most adults should: Exercise for at least 150 minutes each week. The exercise should increase your heart rate and make you sweat (moderate-intensity exercise). Do strengthening exercises at least twice a week. This is in addition to the moderate-intensity exercise. Spend less time sitting. Even light physical activity can be beneficial. Watch cholesterol and blood lipids Have your blood tested for lipids and cholesterol at 45 years of age, then have this test every 5 years. Have your cholesterol levels checked more often if: Your lipid or cholesterol levels are high. You are older than 45 years of age. You are at high risk for heart disease. What should I know about cancer screening? Depending on your health history and family history, you may need to have cancer screening at various ages. This may include screening for: Breast cancer. Cervical cancer. Colorectal cancer. Skin cancer. Lung cancer. What should I know about heart disease, diabetes, and high blood pressure? Blood pressure and heart  disease High blood pressure causes heart disease and increases the risk of stroke. This is more likely to develop in people who have high blood pressure readings, are of African descent, or are overweight. Have your blood pressure checked: Every 3-5 years if you are 18-39 years of age. Every year if you are 40 years old or older. Diabetes Have regular diabetes screenings. This checks your fasting blood sugar level. Have the screening done: Once every three years after age 40 if you are at a normal weight and have a low risk for diabetes. More often and at a younger age if you are overweight or have a high risk for diabetes. What should I know about preventing infection? Hepatitis B If you have a higher risk for hepatitis B, you should be screened for this virus. Talk with your health care provider to find out if you are at risk for hepatitis B infection. Hepatitis C Testing is recommended for: Everyone born from 1945 through 1965. Anyone with known risk factors for hepatitis C. Sexually transmitted infections (STIs) Get screened for STIs, including gonorrhea and chlamydia, if: You are sexually active and are younger than 45 years of age. You are older than 45 years of age and your health care provider tells you that you are at risk for this type of infection. Your sexual activity has changed since you were last screened, and you are at increased risk for chlamydia or gonorrhea. Ask your health care provider if you are at risk. Ask your health care provider about whether you are at high risk for HIV. Your health care provider may recommend a prescription medicine   to help prevent HIV infection. If you choose to take medicine to prevent HIV, you should first get tested for HIV. You should then be tested every 3 months for as long as you are taking the medicine. Pregnancy If you are about to stop having your period (premenopausal) and you may become pregnant, seek counseling before you get  pregnant. Take 400 to 800 micrograms (mcg) of folic acid every day if you become pregnant. Ask for birth control (contraception) if you want to prevent pregnancy. Osteoporosis and menopause Osteoporosis is a disease in which the bones lose minerals and strength with aging. This can result in bone fractures. If you are 65 years old or older, or if you are at risk for osteoporosis and fractures, ask your health care provider if you should: Be screened for bone loss. Take a calcium or vitamin D supplement to lower your risk of fractures. Be given hormone replacement therapy (HRT) to treat symptoms of menopause. Follow these instructions at home: Lifestyle Do not use any products that contain nicotine or tobacco, such as cigarettes, e-cigarettes, and chewing tobacco. If you need help quitting, ask your health care provider. Do not use street drugs. Do not share needles. Ask your health care provider for help if you need support or information about quitting drugs. Alcohol use Do not drink alcohol if: Your health care provider tells you not to drink. You are pregnant, may be pregnant, or are planning to become pregnant. If you drink alcohol: Limit how much you use to 0-1 drink a day. Limit intake if you are breastfeeding. Be aware of how much alcohol is in your drink. In the [place] one drink equals one 12 oz bottle of beer (355 mL), one 5 oz glass of wine (148 mL), or one 1 oz glass of hard liquor (44 mL). General instructions Schedule regular health, dental, and eye exams. Stay current with your vaccines. Tell your health care provider if: You often feel depressed. You have ever been abused or do not feel safe at home. Summary Adopting a healthy lifestyle and getting preventive care are important in promoting health and wellness. Follow your health care provider's instructions about healthy diet, exercising, and getting tested or screened for diseases. Follow your health care provider's  instructions on monitoring your cholesterol and blood pressure. This information is not intended to replace advice given to you by your health care provider. Make sure you discuss any questions you have with your health care provider. Document Revised: 12/08/2020 Document Reviewed: 09/23/2018 Elsevier Patient Education  2022 Elsevier Inc.  

## 2021-08-10 NOTE — Addendum Note (Signed)
Addended by: Emi Holes D on: 08/10/2021 11:22 AM   Modules accepted: Orders

## 2021-08-10 NOTE — Progress Notes (Signed)
Office Note 08/10/2021  CC:  Chief Complaint  Patient presents with   Annual Exam    Pt is fasting    HPI:  Patient is a 45 y.o. female who is here for annual health maintenance exam. A/P as of last visit 3 wks ago: "1) HTN: well controlled on hctz 25 qd, coreg 25 bid and irbesartan 150 qd. Lytes/cr today.   2) Recurrent MDD, GAD, agoraphobia, hx of panic: she is struggling but admits her abusive alcohol drinking is the biggest problem impeding improvement in her anxiety/mood.  However, she states that alcohol is the only thing that makes her feel less anxiety.  Cont wellbutrin xl 150 qd and sertraline 100 qd.  Ween off lorazepam--see #3 below.   3) Alcoholism, current abuse, not contemplating quitting. She did well on naltrexone in the past.   Needs to set up psychiatrist visit--new referral ordered today. In the meantime, she has to ween off lorazepam. Instructions: Buy a pill splitter. Take 1/2 of your lorazepam once a day for 20 days, then take 1/4 of a tab once daily for 20d, then take 1/4 of tab every other day.  Decrease intake from that point until you are completely off this medication."  INTERIM HX: Michelle Myers is here with her daughter today. Since her daughter was present with chose not to talk specifically about her alcohol issues lately.  She noted onset of pain in her left lower quadrant upon awakening this morning.  A little ache at first and then it became sharp.  No nausea or vomiting.  Appetite has been good.  No fever.  No urinary complaints.  She has had a few months of frequent loose stools, and she associates this with her alcohol abuse.  No blood pressure monitoring has been done at home since I last saw her.   PMP AWARE reviewed today: most recent rx for lorazepam was filled 07/06/21, # 60, rx by me. No red flags.   Past Medical History:  Diagnosis Date   Alcoholism (HCC)    Ongoing (many years) of abuse until 05/17/2019 admission for detox.   Anxiety     Elevated transaminase level    likely secondary to alcohol.  Viral Hep screens normal.   Hypertension    Left ankle sprain 09/13/2017    Priority care: x-ray NORMAL 09/13/17.   Rotator cuff tendonitis, right 2021   recalcitrant ->MRI 12/2019--tiny low grade partial thickness bursal region teres minor tear   Tobacco dependence    ongoing as of 05/2019    Past Surgical History:  Procedure Laterality Date   BREAST BIOPSY Right 07/2018   benign (fibroadenoma)   CATARACT EXTRACTION W/ INTRAOCULAR LENS  IMPLANT, BILATERAL  2008 and 2010   CERVICAL CONE BIOPSY     for HPV   WISDOM TOOTH EXTRACTION      Family History  Problem Relation Age of Onset   Prostate cancer Father    Hypertension Father    Brain cancer Daughter        neurofibromatosis, optic neurogliomas   Diabetes Maternal Uncle    Heart disease Maternal Grandmother        CBAG in late 58s   Hypertension Maternal Grandmother    Heart attack Maternal Grandfather 64   Arthritis Mother    Hypertension Mother    Alcohol abuse Brother        half brother, maternal   Mental illness Paternal Grandmother    Heart disease Paternal Grandfather    Heart attack  Brother 70       half brother, maternal   Stroke Neg Hx    Breast cancer Neg Hx     Social History   Socioeconomic History   Marital status: Married    Spouse name: Not on file   Number of children: Not on file   Years of education: Not on file   Highest education level: Not on file  Occupational History   Not on file  Tobacco Use   Smoking status: Every Day    Packs/day: 0.75    Years: 24.00    Pack years: 18.00    Types: Cigarettes   Smokeless tobacco: Never   Tobacco comments:    smoked 1992-present , up to 1.5 ppd  Vaping Use   Vaping Use: Never used  Substance and Sexual Activity   Alcohol use: Not Currently    Alcohol/week: 0.0 standard drinks   Drug use: No   Sexual activity: Yes    Birth control/protection: Pill  Other Topics  Concern   Not on file  Social History Narrative   Married, 2 daughters.   Educ: Bachelor's degree   Occupation: Stay at home mom.   Youngest daughter has NF type 1 (age 25 as of 12/2015)   Tob: 24 pack-yr hx.   Alc: Alcoholic+.   Detox/rehab 05/2019.     Social Determinants of Health   Financial Resource Strain: Not on file  Food Insecurity: Not on file  Transportation Needs: Not on file  Physical Activity: Not on file  Stress: Not on file  Social Connections: Not on file  Intimate Partner Violence: Not on file    Outpatient Medications Prior to Visit  Medication Sig Dispense Refill   buPROPion (WELLBUTRIN XL) 150 MG 24 hr tablet TAKE 1 TABLET BY MOUTH EVERY DAY IN THE MORNING 90 tablet 1   carvedilol (COREG) 25 MG tablet 1 tab po bid 180 tablet 3   hydrochlorothiazide (HYDRODIURIL) 25 MG tablet TAKE 1 TABLET BY MOUTH EVERY DAY IN THE MORNING 90 tablet 3   irbesartan (AVAPRO) 150 MG tablet Take 1 tablet (150 mg total) by mouth daily. 90 tablet 3   LORazepam (ATIVAN) 1 MG tablet TAKE 1 TABLET BY MOUTH TWICE A DAY 60 tablet 1   pantoprazole (PROTONIX) 40 MG tablet TAKE 1 TABLET BY MOUTH EVERY DAY 90 tablet 3   sertraline (ZOLOFT) 100 MG tablet Take 2 tablets (200 mg total) by mouth daily. 90 tablet 3   norethindrone (MICRONOR) 0.35 MG tablet Take 1 tablet by mouth daily.     No facility-administered medications prior to visit.    Allergies  Allergen Reactions   Lisinopril     Angioedema   Epinephrine Other (See Comments)    Tachycardia, dizziness   Sulfa Antibiotics      ? reaction    ROS Review of Systems  Constitutional:  Negative for appetite change, chills, fatigue and fever.  HENT:  Negative for congestion, dental problem, ear pain and sore throat.   Eyes:  Negative for discharge, redness and visual disturbance.  Respiratory:  Negative for cough, chest tightness, shortness of breath and wheezing.   Cardiovascular:  Negative for chest pain, palpitations and leg  swelling.  Gastrointestinal:  Positive for abdominal pain (as per hpi). Negative for blood in stool, diarrhea, nausea and vomiting.  Genitourinary:  Negative for difficulty urinating, dysuria, flank pain, frequency, hematuria and urgency.  Musculoskeletal:  Negative for arthralgias, back pain, joint swelling, myalgias and neck stiffness.  Skin:  Negative for pallor and rash.  Neurological:  Negative for dizziness, speech difficulty, weakness and headaches.  Hematological:  Negative for adenopathy. Does not bruise/bleed easily.  Psychiatric/Behavioral:  Positive for dysphoric mood. Negative for confusion and sleep disturbance. The patient is nervous/anxious.    PE; Vitals with BMI 08/10/2021 07/19/2021 03/26/2021  Height 5\' 5"  5\' 5"  5\' 5"   Weight 163 lbs 3 oz 160 lbs 3 oz 159 lbs 6 oz  BMI 27.16 26.66 26.53  Systolic 131 120  Diastolic 86 81 71  Pulse 82 92 85   Exam chaperoned by , CMA  Gen: Alert, well appearing.  Patient is oriented to person, place, time, and situation. AFFECT: pleasant, lucid thought and speech. ENT: Ears: EACs clear, normal epithelium.  TMs with good light reflex and landmarks bilaterally.  Eyes: no injection, icteris, swelling, or exudate.  EOMI, PERRLA.  R eyelid with violaceous ecchymoses w/out lid swelling. Nose: no drainage or turbinate edema/swelling.  No injection or focal lesion.  Mouth: lips without lesion/swelling.  Oral mucosa pink and moist.  Dentition intact and without obvious caries or gingival swelling.  Oropharynx without erythema, exudate, or swelling.  Neck: supple/nontender.  No LAD, mass, or TM.  Carotid pulses 2+ bilaterally, without bruits. CV: RRR, no m/r/g.   LUNGS: CTA bilat, nonlabored resps, good aeration in all lung fields. ABD: soft, ND, BS normal.  Mild focal TTP in LLQ w/out guarding or rebound.  No change in tenderness with palpation against contracted muscles.  No hepatospenomegaly or mass.  No bruits. EXT: no  clubbing, cyanosis, or edema.  Musculoskeletal: no joint swelling, erythema, warmth, or tenderness.  ROM of all joints intact. Skin - no sores or suspicious lesions or rashes or color changes  Pertinent labs:  Lab Results  Component Value Date   TSH 1.75 07/29/2019   Lab Results  Component Value Date   WBC 12.8 (H) 09/23/2020   HGB 15.2 (H) 09/23/2020   HCT 42.9 09/23/2020   MCV 95.3 09/23/2020   PLT 343 09/23/2020   Lab Results  Component Value Date   CREATININE 0.94 07/19/2021   BUN 10 07/19/2021   NA 136 07/19/2021   K 3.7 07/19/2021   CL 101 07/19/2021   CO2 24 07/19/2021   Lab Results  Component Value Date   ALT 16 07/19/2021   AST 26 07/19/2021   ALKPHOS 63 07/19/2021   BILITOT 0.5 07/19/2021   Lab Results  Component Value Date   CHOL 174 11/22/2019   Lab Results  Component Value Date   HDL 39.30 11/22/2019   Lab Results  Component Value Date   LDLCALC 122 (H) 11/22/2019   Lab Results  Component Value Date   TRIG 63.0 11/22/2019   Lab Results  Component Value Date   CHOLHDL 4 11/22/2019   Lab Results  Component Value Date   HGBA1C 5.4 10/10/2014   ASSESSMENT AND PLAN:   Health maintenance exam: Reviewed age and gender appropriate health maintenance issues (prudent diet, regular exercise, health risks of tobacco and excessive alcohol, use of seatbelts, fire alarms in home, use of sunscreen).  Also reviewed age and gender appropriate health screening as well as vaccine recommendations. Vaccines: given today.  Prevnar 20 (alc abuse/alcoholism)->given today.  Tdap UTD. Labs: cmet normal 3 wks ago.  FLP, cbc, and TSH today. Cervical ca screening: per GYN (Dr. 01/20/2020). Breast ca screening: per GYN -->last in EMR was 2019 (fibroadenoma on bx).  She asked me to rf her OCP to  last her until she schedules GYN appt so I did 90d supply today. Colon ca screening: average risk patient= as per latest guidelines, start screening at any time now.  Options  discussed-->she chose [facility] today.  LLQ pain onset today: mild, benign abd exam.  No associated sx's. Monitor sx's/obs at this time.  An After Visit Summary was printed and given to the patient.  FOLLOW UP:  Return in about 6 months (around 02/08/2022) for routine chronic illness f/u.  Signed:  Santiago Bumpers, MD           08/10/2021

## 2021-09-06 ENCOUNTER — Other Ambulatory Visit: Payer: Self-pay | Admitting: Family Medicine

## 2021-09-24 ENCOUNTER — Other Ambulatory Visit: Payer: Self-pay | Admitting: Family Medicine

## 2021-10-26 ENCOUNTER — Other Ambulatory Visit: Payer: Self-pay | Admitting: Family Medicine

## 2021-10-29 IMAGING — CT CT NECK W/ CM
3 of 4 series · 13 of 33 positions shown, 16 images · IV contrast (Omnipaque)
Comparison: Head CT 05/18/2019.

CLINICAL DATA: 44-year-old female with palpable abnormality/globus
sensation for about 2 months. Feels her voice has changed. Neck pain
when breathing.

EXAM:
CT NECK WITH CONTRAST
TECHNIQUE: Multidetector CT imaging of the neck was performed using the
standard protocol following the bolus administration of intravenous
contrast.
CONTRAST:  75mL OMNIPAQUE IOHEXOL 300 MG/ML  SOLN

[Series 3: axial neck · axial · 0.52mm/px · z∈[+907,+1053]mm · 5 of 111 slices shown, 7 images]
[im 19/111  soft-tissue]
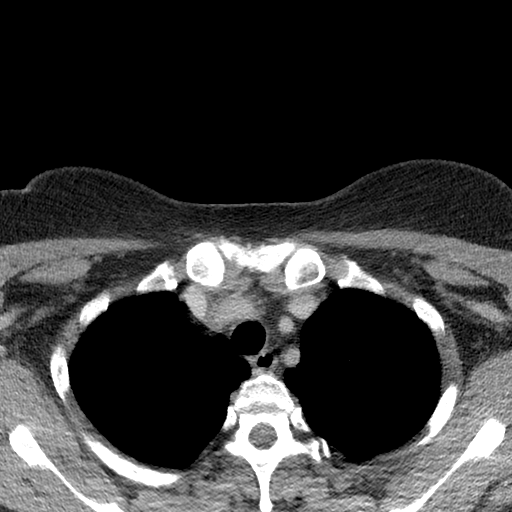
[im 19/111  bone]
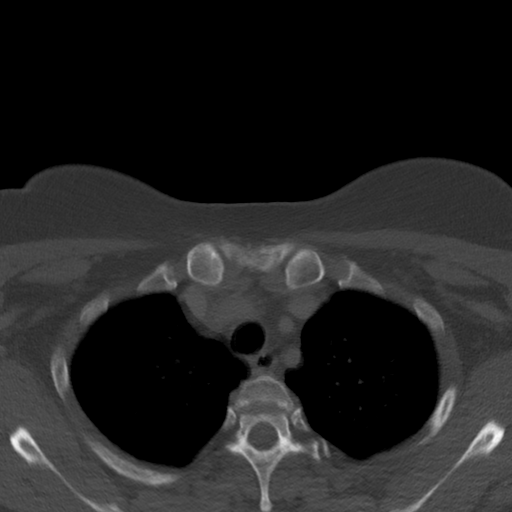
[im 37/111  bone]
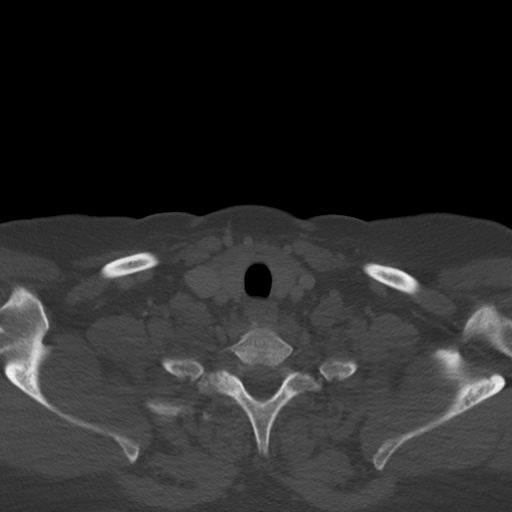
[im 56/111  bone]
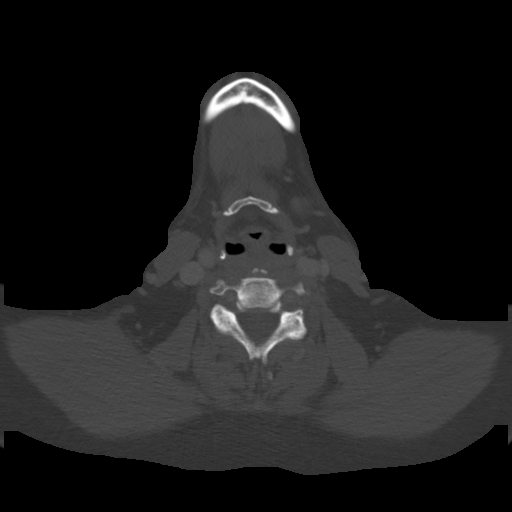
[im 74/111  bone]
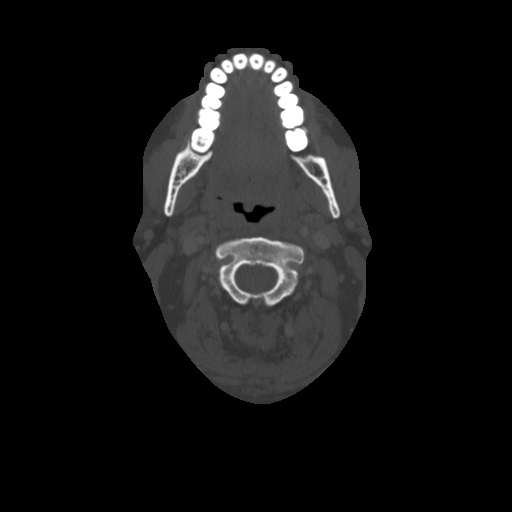
[im 92/111  soft-tissue]
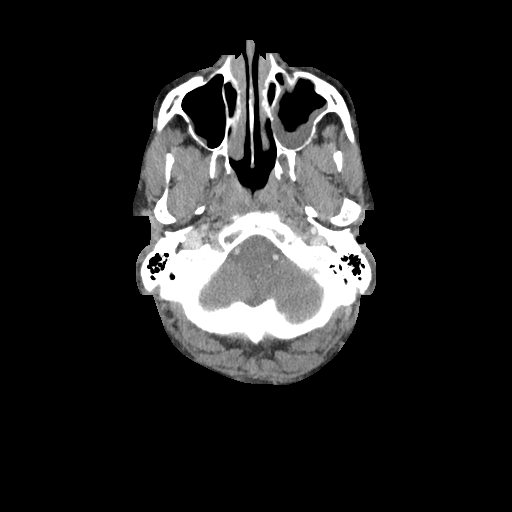
[im 92/111  bone]
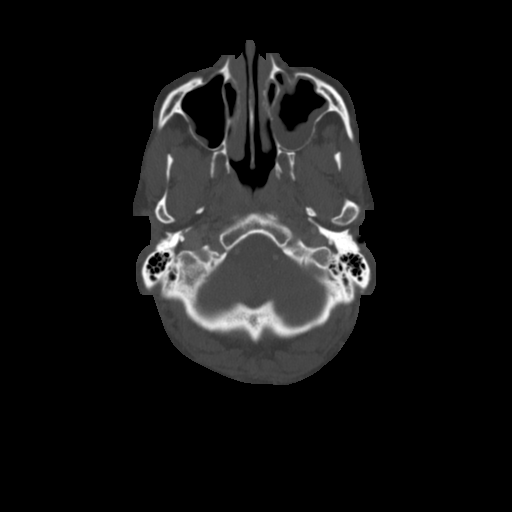

[Series 6: sag neck · sagittal · 0.46mm/px · 5 of 112 slices shown, 6 images]
[im 38/112  bone]
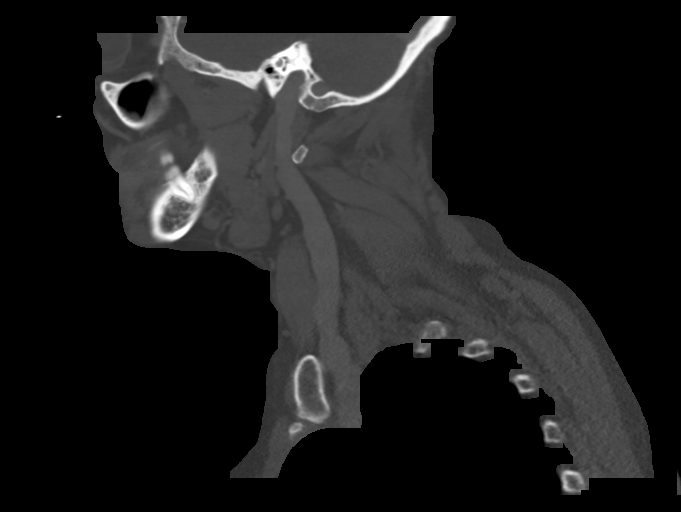
[im 47/112  bone]
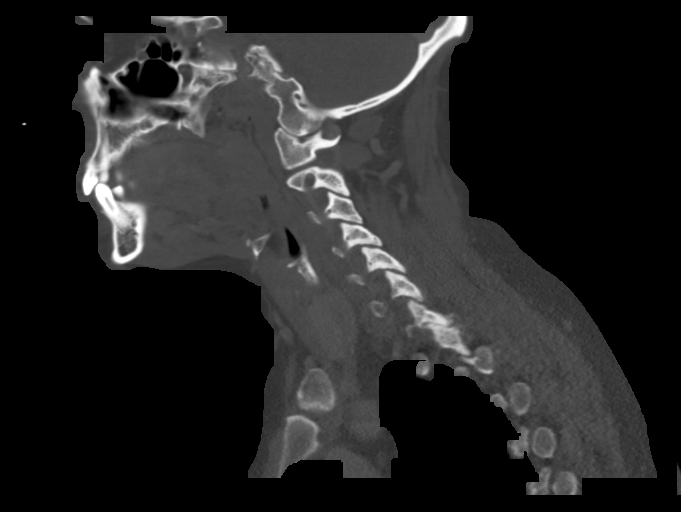
[im 56/112  soft-tissue]
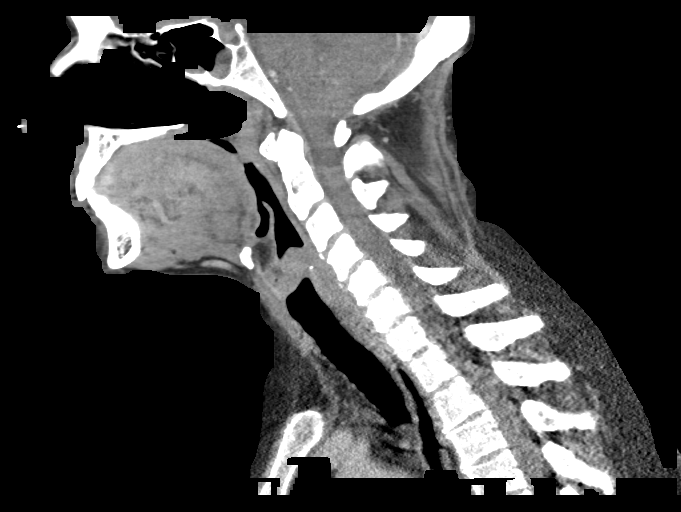
[im 56/112  bone]
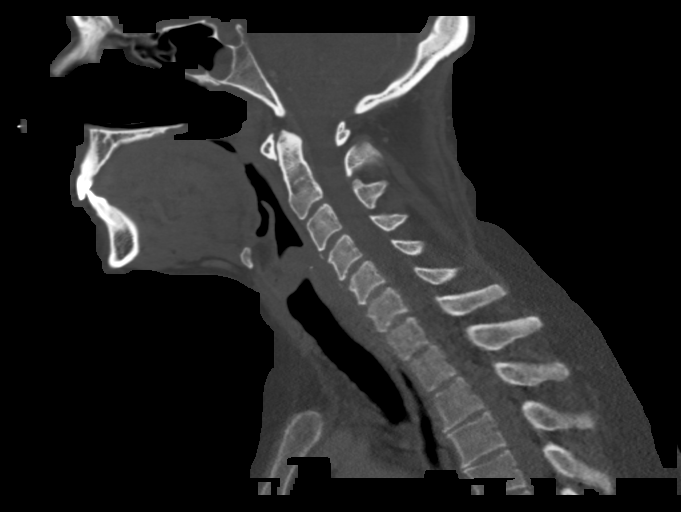
[im 65/112  bone]
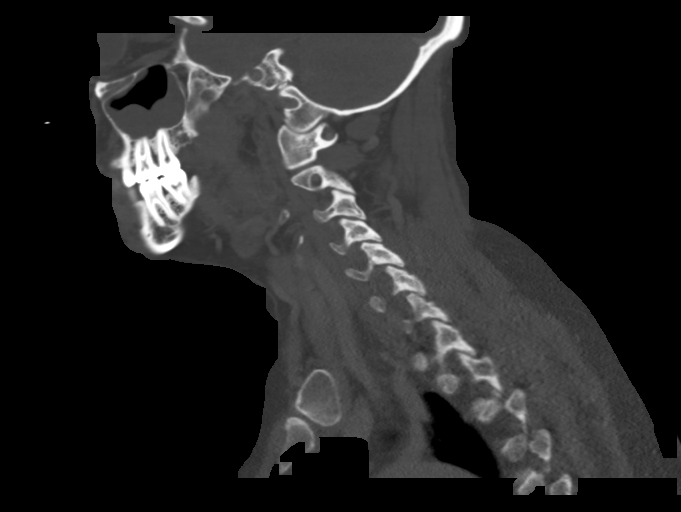
[im 75/112  bone]
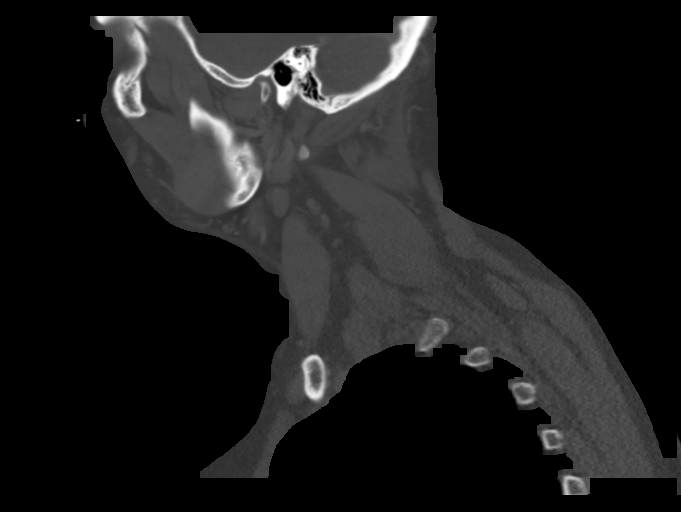

[Series 7: cor neck · coronal · 0.47mm/px · 3 of 148 slices shown]
[im 30/148  bone]
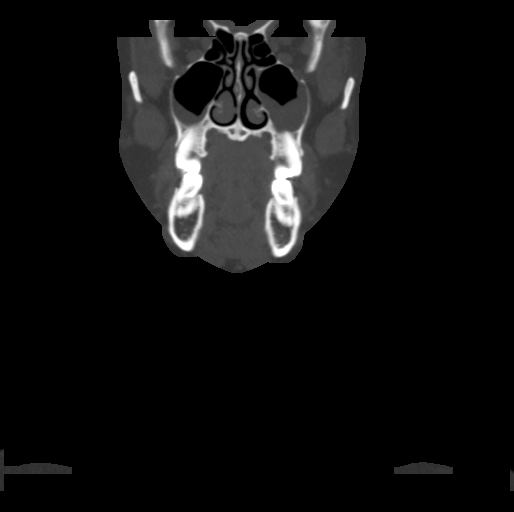
[im 59/148  bone]
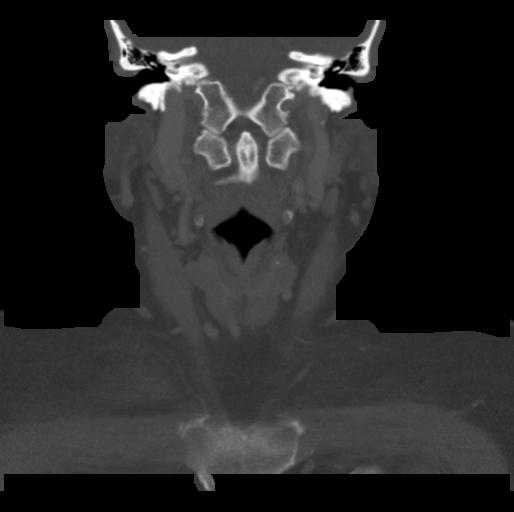
[im 89/148  bone]
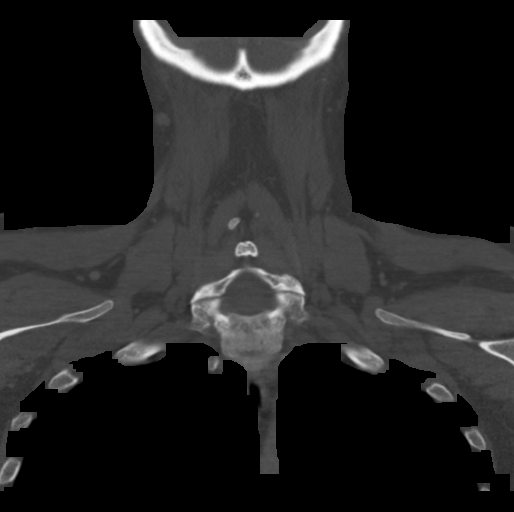

[13 of 33 positions shown; findings below may reference images not displayed]

FINDINGS: Pharynx and larynx: The glottis is closed. Laryngeal soft tissue
contours and enhancement are within normal limits.

Adenoid soft tissues appear somewhat increased for age but symmetric
and without hyperenhancement (series 3, image 23). Soft palate also
appears somewhat redundant. Palatine tonsils and hypopharynx are
within normal limits; punctate postinflammatory calcification of the
right tonsil. No pharyngeal masslike or suspicious hyperenhancement.

Parapharyngeal and retropharyngeal spaces are within normal limits.

Salivary glands: Negative sublingual space. Submandibular glands and
parotid glands are within normal limits.

Thyroid: Negative.

Lymph nodes: Negative. No cervical lymphadenopathy. Bilateral nodes
appear symmetric and within normal limits.

Vascular: Major vascular structures in the neck and at the skull
base are patent. Right ICA origin atherosclerosis without
significant stenosis.

Limited intracranial: Negative.

Visualized orbits: Postoperative changes to both globes, otherwise
negative.

Mastoids and visualized paranasal sinuses: Moderate mucosal
thickening in the left maxillary and sphenoid sinuses. Possible
small left maxillary fluid level. Mild right maxillary and anterior
ethmoid mucosal thickening. Tympanic cavities and mastoids are
clear.

Skeleton: No dental abnormality identified. No acute osseous
abnormality identified.

Upper chest: Negative.  Unremarkable visible esophagus.
IMPRESSION: 1. Adenoids are larger than expected for age but symmetric and
without hyperenhancement.
Elsewhere no pharyngeal or laryngeal mass.  No lymphadenopathy.
In this setting recommend follow-up with ENT for direct
visualization of the larynx and pharynx.

2. Left greater than right paranasal sinus disease, possible mild
sinusitis.

## 2022-01-07 ENCOUNTER — Other Ambulatory Visit: Payer: Self-pay | Admitting: Family Medicine

## 2022-01-24 ENCOUNTER — Other Ambulatory Visit: Payer: Self-pay | Admitting: Family Medicine

## 2022-02-08 ENCOUNTER — Ambulatory Visit: Payer: BC Managed Care – PPO | Admitting: Family Medicine

## 2022-02-18 ENCOUNTER — Other Ambulatory Visit: Payer: Self-pay | Admitting: Family Medicine

## 2022-02-22 ENCOUNTER — Other Ambulatory Visit: Payer: Self-pay | Admitting: Family Medicine

## 2022-02-25 ENCOUNTER — Other Ambulatory Visit: Payer: Self-pay | Admitting: Family Medicine

## 2022-02-25 NOTE — Telephone Encounter (Signed)
90d supply sent. ?Pt due for f/u. ?

## 2022-02-26 NOTE — Telephone Encounter (Signed)
90d supply of each sent in, no additional RF. ?Pt due for f/u ?

## 2022-03-13 ENCOUNTER — Ambulatory Visit: Payer: BC Managed Care – PPO | Admitting: Family Medicine

## 2022-03-13 ENCOUNTER — Encounter: Payer: Self-pay | Admitting: Family Medicine

## 2022-03-13 VITALS — BP 122/86 | HR 87 | Temp 98.3°F | Ht 65.75 in | Wt 169.6 lb

## 2022-03-13 DIAGNOSIS — F339 Major depressive disorder, recurrent, unspecified: Secondary | ICD-10-CM | POA: Diagnosis not present

## 2022-03-13 DIAGNOSIS — I10 Essential (primary) hypertension: Secondary | ICD-10-CM

## 2022-03-13 DIAGNOSIS — F411 Generalized anxiety disorder: Secondary | ICD-10-CM | POA: Diagnosis not present

## 2022-03-13 DIAGNOSIS — F1020 Alcohol dependence, uncomplicated: Secondary | ICD-10-CM

## 2022-03-13 LAB — COMPREHENSIVE METABOLIC PANEL
ALT: 18 U/L (ref 0–35)
AST: 27 U/L (ref 0–37)
Albumin: 4.4 g/dL (ref 3.5–5.2)
Alkaline Phosphatase: 54 U/L (ref 39–117)
BUN: 11 mg/dL (ref 6–23)
CO2: 27 mEq/L (ref 19–32)
Calcium: 9.5 mg/dL (ref 8.4–10.5)
Chloride: 98 mEq/L (ref 96–112)
Creatinine, Ser: 0.71 mg/dL (ref 0.40–1.20)
GFR: 102.25 mL/min (ref >60.00)
Glucose, Bld: 114 mg/dL — ABNORMAL HIGH (ref 70–99)
Potassium: 4.1 mEq/L (ref 3.5–5.1)
Sodium: 134 mEq/L — ABNORMAL LOW (ref 135–145)
Total Bilirubin: 0.5 mg/dL (ref 0.2–1.2)
Total Protein: 7.1 g/dL (ref 6.0–8.3)

## 2022-03-13 LAB — FOLATE: Folate: 3.8 ng/mL — ABNORMAL LOW (ref >5.9)

## 2022-03-13 LAB — VITAMIN B12: Vitamin B-12: 209 pg/mL — ABNORMAL LOW (ref 211–911)

## 2022-03-13 MED ORDER — NORETHINDRONE 0.35 MG PO TABS: 1.0000 | ORAL_TABLET | Freq: Every day | ORAL | 0 refills | Status: DC

## 2022-03-13 NOTE — Patient Instructions (Signed)
Take TWO of the 100mg  sertraline tabs daily

## 2022-03-13 NOTE — Progress Notes (Signed)
OFFICE VISIT  03/13/2022  CC:  Chief Complaint  Patient presents with   Hypertension    Pt is not fasting   Patient is a 46 y.o. female who presents for 7 mo f/u HTN, alcoholism, recurrent MDD/anxiety. A/P as of last visit: "Health maintenance exam: Reviewed age and gender appropriate health maintenance issues (prudent diet, regular exercise, health risks of tobacco and excessive alcohol, use of seatbelts, fire alarms in home, use of sunscreen).  Also reviewed age and gender appropriate health screening as well as vaccine recommendations. Vaccines: given today.  Prevnar 20 (alc abuse/alcoholism)->given today.  Tdap UTD. Labs: cmet normal 3 wks ago.  FLP, cbc, and TSH today. Cervical ca screening: per GYN (Dr. Vincente Poli). Breast ca screening: per GYN -->last in EMR was 2019 (fibroadenoma on bx).  She asked me to rf her OCP to last her until she schedules GYN appt so I did 90d supply today. Colon ca screening: average risk patient= as per latest guidelines, start screening at any time now.  Options discussed-->she chose [facility] today.   LLQ pain onset today: mild, benign abd exam.  No associated sx's. Monitor sx's/obs at this time."  INTERIM HX: Labs were all normal at last visit.  She is not doing very well, struggling with alcohol abuse still, has a lot of anxiety. Brother in [employer].  She plans on trying to go to AA with him in a couple weeks when he gets out. She is currently working on quitting smoking. She is isolating from her friends. She has only been taking sertraline 100 mg 1 tab daily instead of 2. She does continue taking 150 mg Wellbutrin XL daily. She did wean off Ativan as we discussed last visit.  ROS as above, plus--> + chronic fatigue.  no fevers, no CP, no SOB, no wheezing, no cough, no dizziness, no HAs, no rashes, no melena/hematochezia.  No polyuria or polydipsia.  No myalgias or arthralgias.  No focal weakness, paresthesias, or tremors.  No acute vision or  hearing abnormalities.  No dysuria or unusual/new urinary urgency or frequency.  No recent changes in lower legs. No n/v or abd pain.  She does have chronic diarrhea that she states happens every time she is abusing alcohol.  No palpitations.     No home blood pressure monitoring. PMP AWARE reviewed today: most recent rx for lorazepam was filled 07/06/21, # 60, rx by me. No red flags.  Past Medical History:  Diagnosis Date   Alcoholism (HCC)    Ongoing (many years) of abuse until 05/17/2019 admission for detox.   Anxiety    Elevated transaminase level    likely secondary to alcohol.  Viral Hep screens normal.   Hypertension    Left ankle sprain 09/13/2017   [place] Priority care: x-ray NORMAL 09/13/17.   Rotator cuff tendonitis, right 2021   recalcitrant ->MRI 12/2019--tiny low grade partial thickness bursal region teres minor tear   Tobacco dependence    ongoing as of 05/2019    Past Surgical History:  Procedure Laterality Date   BREAST BIOPSY Right 07/2018   benign (fibroadenoma)   CATARACT EXTRACTION W/ INTRAOCULAR LENS  IMPLANT, BILATERAL  2008 and 2010   CERVICAL CONE BIOPSY     for HPV   MOUTH SURGERY  2022   WISDOM TOOTH EXTRACTION      Outpatient Medications Prior to Visit  Medication Sig Dispense Refill   buPROPion (WELLBUTRIN XL) 150 MG 24 hr tablet TAKE 1 TABLET BY MOUTH EVERY DAY IN  THE MORNING 90 tablet 0   carvedilol (COREG) 25 MG tablet TAKE 1 TABLET BY MOUTH TWICE A DAY 180 tablet 0   hydrochlorothiazide (HYDRODIURIL) 25 MG tablet TAKE 1 TABLET BY MOUTH EVERY DAY IN THE MORNING 90 tablet 0   irbesartan (AVAPRO) 150 MG tablet Take 1 tablet (150 mg total) by mouth daily. 90 tablet 3   pantoprazole (PROTONIX) 40 MG tablet TAKE 1 TABLET BY MOUTH EVERY DAY 90 tablet 3   sertraline (ZOLOFT) 100 MG tablet TAKE 2 TABLETS BY MOUTH EVERY DAY 180 tablet 0   LORazepam (ATIVAN) 1 MG tablet TAKE 1 TABLET BY MOUTH TWICE A DAY 60 tablet 1   diazepam (VALIUM) 5 MG tablet  diazepam 5 mg tablet  TAKE 2 TABLETS BY MOUTH 1 HOUR BEFORE APPOINTMENT (Patient not taking: Reported on 03/13/2022)     norethindrone (MICRONOR) 0.35 MG tablet Take 1 tablet (0.35 mg total) by mouth daily. 84 tablet 0   No facility-administered medications prior to visit.    Allergies  Allergen Reactions   Lisinopril     Angioedema   Epinephrine Other (See Comments)    Tachycardia, dizziness   Sulfa Antibiotics      ? reaction    ROS As per HPI  PE:    03/13/2022    1:05 PM 08/10/2021   10:40 AM 07/19/2021    1:32 PM  Vitals with BMI  Height 5' 5.75" 5\' 5"  5\' 5"   Weight 169 lbs 10 oz 163 lbs 3 oz 160 lbs 3 oz  BMI 27.58 27.16 26.66  Systolic 122 131  Diastolic 86 86 81  Pulse 87 82 92   Physical Exam  General: She is tearful.  Her thought is lucid speech clear. No further exam today.  LABS:  Last CBC Lab Results  Component Value Date   WBC 6.8 08/10/2021   HGB 13.6 08/10/2021   HCT 41.0 08/10/2021   MCV 105.0 (H) 08/10/2021   MCH 33.8 09/23/2020   RDW 12.9 08/10/2021   PLT 246.0 08/10/2021   Last metabolic panel Lab Results  Component Value Date   GLUCOSE 141 (H) 07/19/2021   NA 136 07/19/2021   K 3.7 07/19/2021   CL 101 07/19/2021   CO2 24 07/19/2021   BUN 10 07/19/2021   CREATININE 0.94 07/19/2021   GFRNONAA >60 09/23/2020   CALCIUM 9.2 07/19/2021   PROT 7.2 07/19/2021   ALBUMIN 4.3 07/19/2021   BILITOT 0.5 07/19/2021   ALKPHOS 63 07/19/2021   AST 26 07/19/2021   ALT 16 07/19/2021   ANIONGAP 12 09/23/2020   Last lipids Lab Results  Component Value Date   CHOL 175 08/10/2021   HDL 65.40 08/10/2021   LDLCALC 99 08/10/2021   TRIG 54.0 08/10/2021   CHOLHDL 3 08/10/2021   Last hemoglobin A1c Lab Results  Component Value Date   HGBA1C 5.4 10/10/2014   Last thyroid functions Lab Results  Component Value Date   TSH 1.36 08/10/2021   T3TOTAL 135 07/29/2019   Last vitamin B12 and Folate Lab Results  Component Value Date    VITAMINB12 302 03/19/2021   IMPRESSION AND PLAN:  #1 alcoholism, ongoing abuse. Encouraged cessation.  Gave emotional support today.  She wants to quit because she sees what is doing to her life. She has a tentative plan in place to cut back and start attending AA meetings when her brother gets out of [employer] 07/31/2019 in a couple weeks. Check thiamine, vitamin B12, folate.  #2 hypertension. Well-controlled  on carvedilol 25 mg twice daily, HCTZ 25 daily, and irbesartan 150 daily.  #3 recurrent depression, chronic anxiety. She understands that as long as she is drinking alcohol she is not going to get better regarding these things. Through shared decision making we decided to continue with the Wellbutrin and sertraline that she is currently taking.    Of note, she has GYN follow-up set for this July.  She will get her mammogram and Pap smear there.  An After Visit Summary was printed and given to the patient.  FOLLOW UP: Return in about 3 months (around 06/13/2022) for routine chronic illness f/u.  Signed:  Santiago BumpersPhil Doye Montilla, MD           03/13/2022

## 2022-03-15 ENCOUNTER — Telehealth: Payer: Self-pay

## 2022-03-15 NOTE — Telephone Encounter (Addendum)
No results from provider at this time. Pt was made aware still in review process.

## 2022-03-15 NOTE — Telephone Encounter (Signed)
Patient calling regarding lab results from 5/31.  She sees her results in [place], but has not heard from anyone regarding those results. No documentation that [facility] has tried to reach out to her.   Patient understand if we cannot call her today, pt call came in at 4:47PM  Please call patient [phone].

## 2022-03-19 LAB — VITAMIN B1: Vitamin B1 (Thiamine): 8 nmol/L (ref 8–30)

## 2022-03-20 NOTE — Telephone Encounter (Signed)
Spoke with pt this morning regarding concerns. Pt states nothing else was needed.

## 2022-03-20 NOTE — Telephone Encounter (Signed)
Patient said when she talked with Moshe Cipro recently; she was going to send pt mychart message about OTC meds to help with B12 and Folate results.   Patient can be reached via mychart or call [phone].

## 2022-03-20 NOTE — Telephone Encounter (Signed)
LM for pt to return call. Pt was also sent mychart message with recommendations.

## 2022-04-18 DIAGNOSIS — E538 Deficiency of other specified B group vitamins: Secondary | ICD-10-CM | POA: Diagnosis not present

## 2022-04-18 DIAGNOSIS — Z01419 Encounter for gynecological examination (general) (routine) without abnormal findings: Secondary | ICD-10-CM | POA: Diagnosis not present

## 2022-04-18 DIAGNOSIS — Z124 Encounter for screening for malignant neoplasm of cervix: Secondary | ICD-10-CM | POA: Diagnosis not present

## 2022-04-18 DIAGNOSIS — Z1231 Encounter for screening mammogram for malignant neoplasm of breast: Secondary | ICD-10-CM | POA: Diagnosis not present

## 2022-04-18 DIAGNOSIS — Z760 Encounter for issue of repeat prescription: Secondary | ICD-10-CM | POA: Diagnosis not present

## 2022-04-18 DIAGNOSIS — Z6828 Body mass index (BMI) 28.0-28.9, adult: Secondary | ICD-10-CM | POA: Diagnosis not present

## 2022-04-18 LAB — HM PAP SMEAR

## 2022-04-18 LAB — HM MAMMOGRAPHY

## 2022-05-25 ENCOUNTER — Other Ambulatory Visit: Payer: Self-pay | Admitting: Family Medicine

## 2022-06-11 ENCOUNTER — Other Ambulatory Visit: Payer: Self-pay

## 2022-06-11 ENCOUNTER — Other Ambulatory Visit: Payer: Self-pay | Admitting: Family Medicine

## 2022-06-11 ENCOUNTER — Telehealth: Payer: Self-pay

## 2022-06-11 MED ORDER — HYDROCHLOROTHIAZIDE 25 MG PO TABS: ORAL_TABLET | ORAL | 0 refills | Status: DC

## 2022-06-11 NOTE — Telephone Encounter (Signed)
Patient advised refill sent. 

## 2022-06-11 NOTE — Telephone Encounter (Signed)
Patient was told by pharmacy, Dr. Milinda Cave declined refills for patient.  Patient said that she is scheduled to see him this Friday, 9/1.  I verified appt, it was made back in May.  Patient does not have enough meds until appt.  Please fill for 30 d/s, instead of the #[facility]  hydrochlorothiazide (HYDRODIURIL) 25 MG tablet [[id number]]

## 2022-06-14 ENCOUNTER — Ambulatory Visit: Payer: BC Managed Care – PPO | Admitting: Family Medicine

## 2022-06-14 ENCOUNTER — Encounter: Payer: Self-pay | Admitting: Family Medicine

## 2022-06-14 VITALS — BP 137/90 | HR 92 | Temp 99.7°F | Ht 65.75 in | Wt 167.2 lb

## 2022-06-14 DIAGNOSIS — F411 Generalized anxiety disorder: Secondary | ICD-10-CM | POA: Diagnosis not present

## 2022-06-14 DIAGNOSIS — M7711 Lateral epicondylitis, right elbow: Secondary | ICD-10-CM

## 2022-06-14 DIAGNOSIS — F1020 Alcohol dependence, uncomplicated: Secondary | ICD-10-CM | POA: Diagnosis not present

## 2022-06-14 DIAGNOSIS — M25552 Pain in left hip: Secondary | ICD-10-CM

## 2022-06-14 DIAGNOSIS — F339 Major depressive disorder, recurrent, unspecified: Secondary | ICD-10-CM

## 2022-06-14 DIAGNOSIS — I10 Essential (primary) hypertension: Secondary | ICD-10-CM

## 2022-06-14 DIAGNOSIS — M7062 Trochanteric bursitis, left hip: Secondary | ICD-10-CM

## 2022-06-14 LAB — BASIC METABOLIC PANEL
BUN: 12 mg/dL (ref 6–23)
CO2: 27 mEq/L (ref 19–32)
Calcium: 9.6 mg/dL (ref 8.4–10.5)
Chloride: 98 mEq/L (ref 96–112)
Creatinine, Ser: 0.75 mg/dL (ref 0.40–1.20)
GFR: 95.57 mL/min (ref >60.00)
Glucose, Bld: 122 mg/dL — ABNORMAL HIGH (ref 70–99)
Potassium: 4.3 mEq/L (ref 3.5–5.1)
Sodium: 135 mEq/L (ref 135–145)

## 2022-06-14 MED ORDER — NORETHINDRONE 0.35 MG PO TABS
1.0000 | ORAL_TABLET | Freq: Every day | ORAL | 0 refills | Status: DC
Start: 2022-06-14 — End: 2022-11-15

## 2022-06-14 MED ORDER — PANTOPRAZOLE SODIUM 40 MG PO TBEC
40.0000 mg | DELAYED_RELEASE_TABLET | Freq: Every day | ORAL | 1 refills | Status: DC
Start: 2022-06-14 — End: 2022-11-15

## 2022-06-14 MED ORDER — SERTRALINE HCL 100 MG PO TABS: 200.0000 mg | ORAL_TABLET | Freq: Every day | ORAL | 1 refills | Status: DC

## 2022-06-14 MED ORDER — CARVEDILOL 25 MG PO TABS
25.0000 mg | ORAL_TABLET | Freq: Two times a day (BID) | ORAL | 1 refills | Status: DC
Start: 2022-06-14 — End: 2022-10-08

## 2022-06-14 MED ORDER — HYDROCHLOROTHIAZIDE 25 MG PO TABS: ORAL_TABLET | ORAL | 1 refills | Status: DC

## 2022-06-14 NOTE — Progress Notes (Signed)
OFFICE VISIT  06/14/2022  CC: f/u dep/anx, alc abuse, and HTN  Patient is a 46 y.o. female who presents for 31-month follow-up depression, anxiety, and hypertension. A/P as of last visit: "#1 alcoholism, ongoing abuse. Encouraged cessation.  Gave emotional support today.  She wants to quit because she sees what is doing to her life. She has a tentative plan in place to cut back and start attending AA meetings when her brother gets out of [employer] in a couple weeks. Check thiamine, vitamin B12, folate.   #2 hypertension. Well-controlled on carvedilol 25 mg twice daily, HCTZ 25 daily, and irbesartan 150 daily.   #3 recurrent depression, chronic anxiety. She understands that as long as she is drinking alcohol she is not going to get better regarding these things. Through shared decision making we decided to continue with the Wellbutrin and sertraline that she is currently taking."  INTERIM HX: Michelle Myers is doing about the same. still drinking alcohol.  She is functioning okay though. Family relationships are fair.  She feels like she is getting a little closer/bonding a little bit more with her 81 year old daughter who just started college. Still seeking to connect better with her husband. All of these things are leading to her still coping by drinking alcohol.  On a good note, she is cutting back on smoking cigarettes quite a bit.  No home blood pressure monitoring.  She is compliant with her medications, though.  B12 and D3 levels were low last visit. She started supplement of each and says her GYN MD recently rechecked these and they were normal.  Reports for the last several months having some pain in the right elbow posterolateral aspect.  No swelling.  Hurts worse when she reaches out and picks up her water bottle. Also hurting in the left hip some.  Anteriorly and laterally.  Certain motions like flexion of the hip do make it occur. She does not exercise any.  No recent overuse  of either her elbow or hip.  ROS as above, plus--> no fevers, no CP, no SOB, no wheezing, no cough, no dizziness, no HAs, no rashes, no melena/hematochezia.  No polyuria or polydipsia.  No focal weakness, paresthesias, or tremors.  No acute vision or hearing abnormalities.  No dysuria or unusual/new urinary urgency or frequency.  No recent changes in lower legs. No n/v/d or abd pain.  No palpitations.     Past Medical History:  Diagnosis Date   Alcoholism (HCC)    Ongoing (many years) of abuse until 05/17/2019 admission for detox.   Anxiety    Elevated transaminase level    likely secondary to alcohol.  Viral Hep screens normal.   Hypertension    Left ankle sprain 09/13/2017   [place] Priority care: x-ray NORMAL 09/13/17.   Rotator cuff tendonitis, right 2021   recalcitrant ->MRI 12/2019--tiny low grade partial thickness bursal region teres minor tear   Tobacco dependence    ongoing as of 05/2019    Past Surgical History:  Procedure Laterality Date   BREAST BIOPSY Right 07/2018   benign (fibroadenoma)   CATARACT EXTRACTION W/ INTRAOCULAR LENS  IMPLANT, BILATERAL  2008 and 2010   CERVICAL CONE BIOPSY     for HPV   MOUTH SURGERY  2022   WISDOM TOOTH EXTRACTION      Outpatient Medications Prior to Visit  Medication Sig Dispense Refill   buPROPion (WELLBUTRIN XL) 150 MG 24 hr tablet TAKE 1 TABLET BY MOUTH EVERY DAY IN THE MORNING 90  tablet 0   irbesartan (AVAPRO) 150 MG tablet Take 1 tablet (150 mg total) by mouth daily. 90 tablet 3   carvedilol (COREG) 25 MG tablet TAKE 1 TABLET BY MOUTH TWICE A DAY 36 tablet 0   hydrochlorothiazide (HYDRODIURIL) 25 MG tablet TAKE 1 TABLET BY MOUTH EVERY DAY IN THE MORNING 30 tablet 0   norethindrone (MICRONOR) 0.35 MG tablet Take 1 tablet (0.35 mg total) by mouth daily. 84 tablet 0   pantoprazole (PROTONIX) 40 MG tablet TAKE 1 TABLET BY MOUTH EVERY DAY 90 tablet 3   sertraline (ZOLOFT) 100 MG tablet TAKE 2 TABLETS BY MOUTH EVERY DAY 36 tablet 0    No facility-administered medications prior to visit.    Allergies  Allergen Reactions   Lisinopril     Angioedema   Epinephrine Other (See Comments)    Tachycardia, dizziness   Sulfa Antibiotics      ? reaction    ROS As per HPI  PE:    06/14/2022    1:06 PM 06/14/2022    1:03 PM 03/13/2022    1:05 PM  Vitals with BMI  Height  5' 5.75" 5' 5.75"  Weight  167 lbs 3 oz 169 lbs 10 oz  BMI  27.19 27.58  Systolic 137 144 154  Diastolic 90 89 86  Pulse  92 87   Physical Exam  General: Alert and well-appearing. Affect is pleasant but slightly anxious. Right elbow with mild tenderness to palpation over the lateral epicondyle.  Exacerbation of the pain with assisted wrist extension and resisted forearm supination. No swelling or erythema. Left hip with tenderness over the greater trochanter.  She has mild anterior hip pain with full passive flexion of the hip.  External rotation of the hip exacerbates her pain a bit.  Internal rotation is full and without pain.  LABS:  Last CBC Lab Results  Component Value Date   WBC 6.8 08/10/2021   HGB 13.6 08/10/2021   HCT 41.0 08/10/2021   MCV 105.0 (H) 08/10/2021   MCH 33.8 09/23/2020   RDW 12.9 08/10/2021   PLT 246.0 08/10/2021   Last metabolic panel Lab Results  Component Value Date   GLUCOSE 114 (H) 03/13/2022   NA 134 (L) 03/13/2022   K 4.1 03/13/2022   CL 98 03/13/2022   CO2 27 03/13/2022   BUN 11 03/13/2022   CREATININE 0.71 03/13/2022   GFRNONAA >60 09/23/2020   CALCIUM 9.5 03/13/2022   PROT 7.1 03/13/2022   ALBUMIN 4.4 03/13/2022   BILITOT 0.5 03/13/2022   ALKPHOS 54 03/13/2022   AST 27 03/13/2022   ALT 18 03/13/2022   ANIONGAP 12 09/23/2020   Last lipids Lab Results  Component Value Date   CHOL 175 08/10/2021   HDL 65.40 08/10/2021   LDLCALC 99 08/10/2021   TRIG 54.0 08/10/2021   CHOLHDL 3 08/10/2021   Last hemoglobin A1c Lab Results  Component Value Date   HGBA1C 5.4 10/10/2014   Last thyroid  functions Lab Results  Component Value Date   TSH 1.36 08/10/2021   T3TOTAL 135 07/29/2019   Lab Results  Component Value Date   VITAMINB12 209 (L) 03/13/2022   IMPRESSION AND PLAN:  #1 hypertension.  Blood pressure up a little bit here today but will make no changes in medications at this time. I gave her a prescription for a blood pressure cuff and she will get this and try to monitor at home. Continue HCTZ 25 mg a day, carvedilol 25 mg twice a  day, and irbesartan 150 mg a day. Basic metabolic panel today.  2.  Recurrent depression, generalized anxiety, alcohol abuse. Encouraged abstinence. Continue Wellbutrin XL 150 mg a day and sertraline 200 mg a day.  3 right elbow pain--suspect mild lateral epicondylitis. Discussed relative rest and ice.  #4 left hip pain.  Mild. Exam consistent with some gluteal tendinopathy.  She may have a bit of osteoarthritis as well. At this point no work-up.  Stretching exercises given.   Reassurance.  Observation.  An After Visit Summary was printed and given to the patient.  FOLLOW UP: Return in about 3 months (around 09/13/2022) for annual CPE (fasting).  Signed:  Santiago Bumpers, MD           06/14/2022

## 2022-06-25 ENCOUNTER — Other Ambulatory Visit: Payer: Self-pay | Admitting: Family Medicine

## 2022-08-01 ENCOUNTER — Other Ambulatory Visit: Payer: Self-pay | Admitting: Family Medicine

## 2022-09-09 ENCOUNTER — Telehealth: Payer: Self-pay | Admitting: *Deleted

## 2022-09-09 NOTE — Patient Outreach (Signed)
  Care Coordination   Initial Visit Note   09/09/2022 Name: Michelle Myers MRN: [id number] DOB: 06-Apr-1976  Michelle Myers is a 46 y.o. year old female who sees [facility]ow[facility], Maryjean Morn, MD for primary care. I spoke with  Aloha Gell by phone today.  What matters to the patients health and wellness today?  Nothing needed at this time    Goals Addressed             This Visit's Progress    COMPLETED: No Needs       Care Coordination Interventions: Reviewed medications with patient and discussed adherences with no needed refills Reviewed scheduled/upcoming provider appointments including verification of sufficient transportation Screening for signs and symptoms of depression related to chronic disease state  Assessed social determinant of health barriers         SDOH assessments and interventions completed:  Yes  SDOH Interventions Today    Flowsheet Row Most Recent Value  SDOH Interventions   Food Insecurity Interventions Intervention Not Indicated  Housing Interventions Intervention Not Indicated  Transportation Interventions Intervention Not Indicated  Utilities Interventions Intervention Not Indicated        Care Coordination Interventions:  Yes, provided   Follow up plan: No further intervention required.   Encounter Outcome:  Pt. Visit Completed   Elliot Cousin, RN Care Management Coordinator [facility] Main Office [phone]

## 2022-09-09 NOTE — Patient Instructions (Signed)
Visit Information  Thank you for taking time to visit with me today. Please don't hesitate to contact me if I can be of assistance to you.   Following are the goals we discussed today:   Goals Addressed             This Visit's Progress    COMPLETED: No Needs       Care Coordination Interventions: Reviewed medications with patient and discussed adherences with no needed refills Reviewed scheduled/upcoming provider appointments including verification of sufficient transportation Screening for signs and symptoms of depression related to chronic disease state  Assessed social determinant of health barriers         Please call the care guide team at [phone] if you need to cancel or reschedule your appointment.   If you are experiencing a Mental Health or Behavioral Health Crisis or need someone to talk to, please call the Suicide and Crisis Lifeline: 988 call the [place] National Suicide Prevention Lifeline: (919)545-0050 or TTY: 434-662-6334 TTY 878-430-7504) to talk to a trained counselor  Patient verbalizes understanding of instructions and care plan provided today and agrees to view in MyChart. Active MyChart status and patient understanding of how to access instructions and care plan via MyChart confirmed with patient.     No further follow up required: No further follow up needed  Elliot Cousin, RN Care Management Coordinator [facility] Main Office [phone]

## 2022-09-13 ENCOUNTER — Ambulatory Visit (INDEPENDENT_AMBULATORY_CARE_PROVIDER_SITE_OTHER): Payer: BC Managed Care – PPO | Admitting: Family Medicine

## 2022-09-13 ENCOUNTER — Encounter: Payer: Self-pay | Admitting: Family Medicine

## 2022-09-13 VITALS — BP 168/112 | HR 85 | Temp 98.9°F | Ht 65.75 in | Wt 167.6 lb

## 2022-09-13 DIAGNOSIS — Z1211 Encounter for screening for malignant neoplasm of colon: Secondary | ICD-10-CM

## 2022-09-13 DIAGNOSIS — F410 Panic disorder [episodic paroxysmal anxiety] without agoraphobia: Secondary | ICD-10-CM

## 2022-09-13 DIAGNOSIS — F4000 Agoraphobia, unspecified: Secondary | ICD-10-CM

## 2022-09-13 DIAGNOSIS — I10 Essential (primary) hypertension: Secondary | ICD-10-CM | POA: Diagnosis not present

## 2022-09-13 DIAGNOSIS — Z0000 Encounter for general adult medical examination without abnormal findings: Secondary | ICD-10-CM | POA: Diagnosis not present

## 2022-09-13 LAB — COMPREHENSIVE METABOLIC PANEL
ALT: 51 U/L — ABNORMAL HIGH (ref 0–35)
AST: 66 U/L — ABNORMAL HIGH (ref 0–37)
Albumin: 4.5 g/dL (ref 3.5–5.2)
Alkaline Phosphatase: 62 U/L (ref 39–117)
BUN: 8 mg/dL (ref 6–23)
CO2: 28 mEq/L (ref 19–32)
Calcium: 9.1 mg/dL (ref 8.4–10.5)
Chloride: 96 mEq/L (ref 96–112)
Creatinine, Ser: 0.67 mg/dL (ref 0.40–1.20)
GFR: 104.74 mL/min (ref >60.00)
Glucose, Bld: 109 mg/dL — ABNORMAL HIGH (ref 70–99)
Potassium: 4.6 mEq/L (ref 3.5–5.1)
Sodium: 134 mEq/L — ABNORMAL LOW (ref 135–145)
Total Bilirubin: 0.4 mg/dL (ref 0.2–1.2)
Total Protein: 7.3 g/dL (ref 6.0–8.3)

## 2022-09-13 LAB — LIPID PANEL
Cholesterol: 174 mg/dL (ref 0–200)
HDL: 73.1 mg/dL (ref >39.00)
LDL Cholesterol: 87 mg/dL (ref 0–99)
NonHDL: 101.14
Total CHOL/HDL Ratio: 2
Triglycerides: 72 mg/dL (ref 0.0–149.0)
VLDL: 14.4 mg/dL (ref 0.0–40.0)

## 2022-09-13 LAB — CBC
HCT: 40.1 % (ref 36.0–46.0)
Hemoglobin: 13.6 g/dL (ref 12.0–15.0)
MCHC: 33.9 g/dL (ref 30.0–36.0)
MCV: 100.8 fl — ABNORMAL HIGH (ref 78.0–100.0)
Platelets: 289 10*3/uL (ref 150.0–400.0)
RBC: 3.98 Mil/uL (ref 3.87–5.11)
RDW: 12.3 % (ref 11.5–15.5)
WBC: 7.2 10*3/uL (ref 4.0–10.5)

## 2022-09-13 LAB — TSH: TSH: 2.76 u[IU]/mL (ref 0.35–5.50)

## 2022-09-13 MED ORDER — PROPRANOLOL HCL 10 MG PO TABS: ORAL_TABLET | ORAL | 1 refills | Status: DC

## 2022-09-13 NOTE — Progress Notes (Signed)
Office Note 09/13/2022  CC:  Chief Complaint  Patient presents with   Annual Exam    Pt is not fasting   HPI:  Patient is a 46 y.o. female who is here for annual health maintenance exam and follow-up hypertension and anxiety and depression.  She is still drinking lots of wine--"too much".  Says relationships with kids are good, gets along with husband fairly well.  Working part-time as a Building control surveyor for elderly.  Home blood pressure monitoring.  She is compliant with her medications. Denies feeling depressed lately.  She has a lot of anxiety the last couple months.  Anytime she tries to go out of her house she feels tremulous and overwhelmed, heart races, feels lightheaded.   Sometimes this happens at home unrelated to leave the house as well.  Past Medical History:  Diagnosis Date   Alcoholism ([place])    Ongoing (many years) of abuse until 05/17/2019 admission for detox.   Anxiety    Elevated transaminase level    likely secondary to alcohol.  Viral Hep screens normal.   Hypertension    Left ankle sprain 09/13/2017   [place] Priority care: x-ray NORMAL 09/13/17.   Rotator cuff tendonitis, right 2021   recalcitrant ->MRI 12/2019--tiny low grade partial thickness bursal region teres minor tear   Tobacco dependence    ongoing as of 05/2019    Past Surgical History:  Procedure Laterality Date   BREAST BIOPSY Right 07/2018   benign (fibroadenoma)   CATARACT EXTRACTION W/ INTRAOCULAR LENS  IMPLANT, BILATERAL  2008 and 2010   CERVICAL CONE BIOPSY     for HPV   MOUTH SURGERY  2022   WISDOM TOOTH EXTRACTION      Family History  Problem Relation Age of Onset   Prostate cancer Father    Hypertension Father    Brain cancer Daughter        neurofibromatosis, optic neurogliomas   Diabetes Maternal Uncle    Heart disease Maternal Grandmother        CBAG in late 36s   Hypertension Maternal Grandmother    Heart attack Maternal Grandfather 47   Arthritis Mother    Hypertension  Mother    Alcohol abuse Brother        half brother, maternal   Mental illness Paternal Grandmother    Heart disease Paternal Grandfather    Heart attack Brother 66       half brother, maternal   Stroke Neg Hx    Breast cancer Neg Hx     Social History   Socioeconomic History   Marital status: Married    Spouse name: Not on file   Number of children: Not on file   Years of education: Not on file   Highest education level: Not on file  Occupational History   Not on file  Tobacco Use   Smoking status: Every Day    Packs/day: 0.75    Years: 24.00    Total pack years: 18.00    Types: Cigarettes   Smokeless tobacco: Never   Tobacco comments:    smoked 1992-present , up to 1.5 ppd  Vaping Use   Vaping Use: Never used  Substance and Sexual Activity   Alcohol use: Not Currently    Alcohol/week: 0.0 standard drinks of alcohol   Drug use: No   Sexual activity: Yes    Birth control/protection: Pill  Other Topics Concern   Not on file  Social History Narrative   Married, 2 daughters.  Educ: Bachelor's degree   Occupation: Stay at home mom.   Youngest daughter has NF type 1 (age 33 as of 12/2015)   Tob: 24 pack-yr hx.   Alc: Alcoholic+.   Detox/rehab 05/2019.     Social Determinants of Health   Financial Resource Strain: Not on file  Food Insecurity: No Food Insecurity (09/09/2022)   Hunger Vital Sign    Worried About Running Out of Food in the Last Year: Never true    Ran Out of Food in the Last Year: Never true  Transportation Needs: No Transportation Needs (09/09/2022)   PRAPARE - Hydrologist (Medical): No    Lack of Transportation (Non-Medical): No  Physical Activity: Not on file  Stress: Not on file  Social Connections: Not on file  Intimate Partner Violence: Not on file    Outpatient Medications Prior to Visit  Medication Sig Dispense Refill   buPROPion (WELLBUTRIN XL) 150 MG 24 hr tablet TAKE 1 TABLET BY MOUTH EVERY DAY IN THE  MORNING 90 tablet 0   carvedilol (COREG) 25 MG tablet Take 1 tablet (25 mg total) by mouth 2 (two) times daily. 180 tablet 1   hydrochlorothiazide (HYDRODIURIL) 25 MG tablet TAKE 1 TABLET BY MOUTH EVERY DAY IN THE MORNING 90 tablet 1   irbesartan (AVAPRO) 150 MG tablet TAKE 1 TABLET BY MOUTH EVERY DAY 90 tablet 0   norethindrone (MICRONOR) 0.35 MG tablet Take 1 tablet (0.35 mg total) by mouth daily. 84 tablet 0   pantoprazole (PROTONIX) 40 MG tablet Take 1 tablet (40 mg total) by mouth daily. 90 tablet 1   sertraline (ZOLOFT) 100 MG tablet Take 2 tablets (200 mg total) by mouth daily. (Patient taking differently: Take 100 mg by mouth daily.) 180 tablet 1   No facility-administered medications prior to visit.    Allergies  Allergen Reactions   Lisinopril     Angioedema   Epinephrine Other (See Comments)    Tachycardia, dizziness   Sulfa Antibiotics      ? reaction    ROS Review of Systems  Constitutional:  Negative for appetite change, chills, fatigue and fever.  HENT:  Negative for congestion, dental problem, ear pain and sore throat.   Eyes:  Negative for discharge, redness and visual disturbance.  Respiratory:  Negative for cough, chest tightness, shortness of breath and wheezing.   Cardiovascular:  Negative for chest pain, palpitations and leg swelling.  Gastrointestinal:  Negative for abdominal pain, blood in stool, diarrhea, nausea and vomiting.  Genitourinary:  Negative for difficulty urinating, dysuria, flank pain, frequency, hematuria and urgency.  Musculoskeletal:  Negative for arthralgias, back pain, joint swelling, myalgias and neck stiffness.  Skin:  Negative for pallor and rash.  Neurological:  Negative for dizziness, speech difficulty, weakness and headaches.  Hematological:  Negative for adenopathy. Does not bruise/bleed easily.  Psychiatric/Behavioral:  Negative for confusion and sleep disturbance. The patient is nervous/anxious.     PE;    09/13/2022    1:04 PM  06/14/2022    1:06 PM 06/14/2022    1:03 PM  Vitals with BMI  Height 5' 5.75"  5' 5.75"  Weight 167 lbs 10 oz  167 lbs 3 oz  BMI 0000000  0000000  Systolic 123XX123 0000000 [id number]  Diastolic [id number] 90 89  Pulse 85  92  Exam chaperoned by Gertie Gowda, phlebotomist  Gen: Alert, well appearing.  Patient is oriented to person, place, time, and situation. AFFECT: pleasant, lucid thought and speech.  ENT: Ears: EACs clear, normal epithelium.  TMs with good light reflex and landmarks bilaterally.  Eyes: no injection, icteris, swelling, or exudate.  EOMI, PERRLA. Nose: no drainage or turbinate edema/swelling.  No injection or focal lesion.  Mouth: lips without lesion/swelling.  Oral mucosa pink and moist.  Dentition intact and without obvious caries or gingival swelling.  Oropharynx without erythema, exudate, or swelling.  Neck: supple/nontender.  No LAD, mass, or TM.  Carotid pulses 2+ bilaterally, without bruits. CV: RRR, no m/r/g.   LUNGS: CTA bilat, nonlabored resps, good aeration in all lung fields. ABD: soft, NT, ND, BS normal.  No hepatospenomegaly or mass.  No bruits. EXT: no clubbing, cyanosis, or edema.  Musculoskeletal: no joint swelling, erythema, warmth, or tenderness.  ROM of all joints intact. Skin - no sores or suspicious lesions or rashes or color changes   Pertinent labs:  Lab Results  Component Value Date   TSH 1.36 08/10/2021   Lab Results  Component Value Date   WBC 6.8 08/10/2021   HGB 13.6 08/10/2021   HCT 41.0 08/10/2021   MCV 105.0 (H) 08/10/2021   PLT 246.0 08/10/2021   Lab Results  Component Value Date   CREATININE 0.75 06/14/2022   BUN 12 06/14/2022   NA 135 06/14/2022   K 4.3 06/14/2022   CL 98 06/14/2022   CO2 27 06/14/2022   Lab Results  Component Value Date   ALT 18 03/13/2022   AST 27 03/13/2022   ALKPHOS 54 03/13/2022   BILITOT 0.5 03/13/2022   Lab Results  Component Value Date   CHOL 175 08/10/2021   Lab Results  Component Value Date   HDL 65.40  08/10/2021   Lab Results  Component Value Date   LDLCALC 99 08/10/2021   Lab Results  Component Value Date   TRIG 54.0 08/10/2021   Lab Results  Component Value Date   CHOLHDL 3 08/10/2021   Lab Results  Component Value Date   HGBA1C 5.4 10/10/2014   ASSESSMENT AND PLAN:   #1 health maintenance exam: Reviewed age and gender appropriate health maintenance issues (prudent diet, regular exercise, health risks of tobacco and excessive alcohol, use of seatbelts, fire alarms in home, use of sunscreen).  Also reviewed age and gender appropriate health screening as well as vaccine recommendations. Vaccines: UTD Labs: Fasting health panel Cervical ca screening: per GYN MD Breast ca screening: Mammogram normal 04/2022(GYN) Colon ca screening:  average risk patient= as per latest guidelines.  Due for initial screening-->cologuard ordered.  #2 hypertension, significantly elevated blood pressure here today. She is extremely anxious today. Encouraged her once again to get a blood pressure cuff to monitor at home.  Continue HCTZ 25 mg daily, irbesartan 150 mg daily, and carvedilol 25 mg twice daily. Electrolytes and creatinine today.  3.  Recurrent major depressive disorder in remission. Continue sertraline 200 mg a day and Wellbutrin XL 150 mg a day.  4.  GAD with panic attacks. Agoraphobia as well. Trial of propranolol 10 mg, 1-2 twice daily as needed. Therapeutic expectations and side effect profile of medication discussed today.  Patient's questions answered.  #5 alcoholism. Unfortunately, can continues to abuse alcohol. She is not ready to try quitting right now. Hepatic panel today.  An After Visit Summary was printed and given to the patient.  FOLLOW UP:  Return in about 4 weeks (around 10/11/2022) for Follow-up anxiety and blood pressure.  Signed:  Santiago Bumpers, MD  09/13/2022  

## 2022-09-16 ENCOUNTER — Other Ambulatory Visit (INDEPENDENT_AMBULATORY_CARE_PROVIDER_SITE_OTHER): Payer: BC Managed Care – PPO

## 2022-09-16 DIAGNOSIS — R7301 Impaired fasting glucose: Secondary | ICD-10-CM

## 2022-09-16 LAB — HEMOGLOBIN A1C: Hgb A1c MFr Bld: 5.6 % (ref 4.6–6.5)

## 2022-09-17 ENCOUNTER — Encounter: Payer: Self-pay | Admitting: Family Medicine

## 2022-10-02 ENCOUNTER — Other Ambulatory Visit: Payer: Self-pay | Admitting: Family Medicine

## 2022-10-06 ENCOUNTER — Other Ambulatory Visit: Payer: Self-pay | Admitting: Family Medicine

## 2022-10-07 ENCOUNTER — Other Ambulatory Visit: Payer: Self-pay | Admitting: Family Medicine

## 2022-10-11 ENCOUNTER — Ambulatory Visit: Payer: BC Managed Care – PPO | Admitting: Family Medicine

## 2022-10-28 ENCOUNTER — Other Ambulatory Visit: Payer: Self-pay | Admitting: Family Medicine

## 2022-11-07 ENCOUNTER — Other Ambulatory Visit: Payer: Self-pay | Admitting: Family Medicine

## 2022-11-18 ENCOUNTER — Encounter: Payer: Self-pay | Admitting: Family Medicine

## 2022-11-18 ENCOUNTER — Ambulatory Visit: Payer: BC Managed Care – PPO | Admitting: Family Medicine

## 2022-11-18 VITALS — BP 131/89 | HR 93 | Temp 98.8°F | Ht 65.75 in | Wt 165.0 lb

## 2022-11-18 DIAGNOSIS — F1020 Alcohol dependence, uncomplicated: Secondary | ICD-10-CM | POA: Diagnosis not present

## 2022-11-18 DIAGNOSIS — F411 Generalized anxiety disorder: Secondary | ICD-10-CM | POA: Diagnosis not present

## 2022-11-18 DIAGNOSIS — I10 Essential (primary) hypertension: Secondary | ICD-10-CM

## 2022-11-18 DIAGNOSIS — F410 Panic disorder [episodic paroxysmal anxiety] without agoraphobia: Secondary | ICD-10-CM | POA: Diagnosis not present

## 2022-11-18 MED ORDER — NORETHINDRONE 0.35 MG PO TABS: 1.0000 | ORAL_TABLET | Freq: Every day | ORAL | 1 refills | Status: DC

## 2022-11-18 MED ORDER — CARVEDILOL 25 MG PO TABS: 25.0000 mg | ORAL_TABLET | Freq: Two times a day (BID) | ORAL | 1 refills | Status: DC

## 2022-11-18 MED ORDER — SERTRALINE HCL 100 MG PO TABS: 100.0000 mg | ORAL_TABLET | Freq: Every day | ORAL | 1 refills | Status: DC

## 2022-11-18 MED ORDER — PROPRANOLOL HCL 10 MG PO TABS: ORAL_TABLET | ORAL | 1 refills | Status: DC

## 2022-11-18 MED ORDER — BUPROPION HCL ER (XL) 150 MG PO TB24: ORAL_TABLET | ORAL | 1 refills | Status: DC

## 2022-11-18 MED ORDER — HYDROCHLOROTHIAZIDE 25 MG PO TABS: ORAL_TABLET | ORAL | 1 refills | Status: DC

## 2022-11-18 MED ORDER — IRBESARTAN 150 MG PO TABS: 150.0000 mg | ORAL_TABLET | Freq: Every day | ORAL | 1 refills | Status: DC

## 2022-11-18 MED ORDER — PANTOPRAZOLE SODIUM 40 MG PO TBEC: 40.0000 mg | DELAYED_RELEASE_TABLET | Freq: Every day | ORAL | 1 refills | Status: DC

## 2022-11-18 NOTE — Progress Notes (Signed)
OFFICE VISIT  11/18/2022  CC:  Chief Complaint  Patient presents with   Hypertension    Pt is not fasting    Patient is a 47 y.o. female who presents for 20-month follow-up anxiety and hypertension. A/P as of last visit: "#1 health maintenance exam: Reviewed age and gender appropriate health maintenance issues (prudent diet, regular exercise, health risks of tobacco and excessive alcohol, use of seatbelts, fire alarms in home, use of sunscreen).  Also reviewed age and gender appropriate health screening as well as vaccine recommendations. Vaccines: UTD Labs: Fasting health panel Cervical ca screening: per GYN MD Breast ca screening: Mammogram normal 04/2022(GYN) Colon ca screening:  average risk patient= as per latest guidelines.  Due for initial screening-->cologuard ordered.   #2 hypertension, significantly elevated blood pressure here today. She is extremely anxious today. Encouraged her once again to get a blood pressure cuff to monitor at home.  Continue HCTZ 25 mg daily, irbesartan 150 mg daily, and carvedilol 25 mg twice daily. Electrolytes and creatinine today.   3.  Recurrent major depressive disorder in remission. Continue sertraline 200 mg a day and Wellbutrin XL 150 mg a day.   4.  GAD with panic attacks. Agoraphobia as well. Trial of propranolol 10 mg, 1-2 twice daily as needed. Therapeutic expectations and side effect profile of medication discussed today.  Patient's questions answered.   #5 alcoholism. Unfortunately, can continues to abuse alcohol. She is not ready to try quitting right now. Hepatic panel today."  INTERIM HX: Very anxious.  Still drinking 2 bottles of wine a night.  Her family does not know.  She is upset with herself and understands that her depressed mood and anxiety is largely stemming from her alcohol abuse. She is not ready to quit. She has not tried the propranolol yet because of fear--the pharmacist warned her about it having a negative  interaction with carvedilol.  However, she did take one of the Prantal 10 mg tabs about the time she got here today and says she feels like it is helping a little bit.  She does not monitor her blood pressure at home because it makes her highly anxious.  ROS as above, plus--> no fevers, no CP, no SOB, no wheezing, no cough, no dizziness, no HAs, no rashes, no melena/hematochezia.  No polyuria or polydipsia.  No myalgias or arthralgias.  No focal weakness, paresthesias, or tremors.  No acute vision or hearing abnormalities.  No dysuria or unusual/new urinary urgency or frequency.  No recent changes in lower legs. No n/v/d or abd pain.  No palpitations.    Past Medical History:  Diagnosis Date   Alcoholism ([place])    Ongoing (many years) of abuse until 05/17/2019 admission for detox.   Anxiety    Elevated transaminase level    likely secondary to alcohol.  Viral Hep screens normal.   Hypertension    IFG (impaired fasting glucose)    08/2022 fasting 109.  Hba1c 5.6%   Left ankle sprain 09/13/2017   [place] Priority care: x-ray NORMAL 09/13/17.   Rotator cuff tendonitis, right 2021   recalcitrant ->MRI 12/2019--tiny low grade partial thickness bursal region teres minor tear   Tobacco dependence    ongoing as of 05/2019    Past Surgical History:  Procedure Laterality Date   BREAST BIOPSY Right 07/2018   benign (fibroadenoma)   CATARACT EXTRACTION W/ INTRAOCULAR LENS  IMPLANT, BILATERAL  2008 and 2010   CERVICAL CONE BIOPSY     for HPV  MOUTH SURGERY  2022   WISDOM TOOTH EXTRACTION      Outpatient Medications Prior to Visit  Medication Sig Dispense Refill   buPROPion (WELLBUTRIN XL) 150 MG 24 hr tablet TAKE 1 TABLET BY MOUTH EVERY DAY IN THE MORNING. OFFICE VISIT NEEDED FOR FURTHER REFILLS 30 tablet 0   carvedilol (COREG) 25 MG tablet TAKE 1 TABLET BY MOUTH TWICE A DAY 60 tablet 0   hydrochlorothiazide (HYDRODIURIL) 25 MG tablet TAKE 1 TABLET BY MOUTH EVERY DAY IN THE MORNING 90 tablet  1   irbesartan (AVAPRO) 150 MG tablet TAKE 1 TABLET BY MOUTH EVERY DAY 90 tablet 0   norethindrone (MICRONOR) 0.35 MG tablet Take 1 tablet (0.35 mg total) by mouth daily. 84 tablet 0   pantoprazole (PROTONIX) 40 MG tablet Take 1 tablet (40 mg total) by mouth daily. 90 tablet 1   propranolol (INDERAL) 10 MG tablet 1-2 tabs po bid prn anxiety 60 tablet 1   sertraline (ZOLOFT) 100 MG tablet Take 2 tablets (200 mg total) by mouth daily. (Patient taking differently: Take 100 mg by mouth daily.) 180 tablet 1   No facility-administered medications prior to visit.    Allergies  Allergen Reactions   Lisinopril     Angioedema   Epinephrine Other (See Comments)    Tachycardia, dizziness   Sulfa Antibiotics      ? reaction    Review of Systems As per HPI  PE:    11/18/2022    2:26 PM 11/18/2022    2:25 PM 11/18/2022    2:24 PM  Vitals with BMI  Height   5' 5.75"  Weight   165 lbs  BMI   56.21  Systolic 308 657 846  Diastolic 89 91 962  Pulse   93     Physical Exam  Gen: Alert, well appearing.  Patient is oriented to person, place, time, and situation. AFFECT: very anxious, tearful at times. CV: RRR (rate 80 by me), no murmur   LABS:  Last CBC Lab Results  Component Value Date   WBC 7.2 09/13/2022   HGB 13.6 09/13/2022   HCT 40.1 09/13/2022   MCV 100.8 (H) 09/13/2022   MCH 33.8 09/23/2020   RDW 12.3 09/13/2022   PLT 289.0 95/28/4132   Last metabolic panel Lab Results  Component Value Date   GLUCOSE 109 (H) 09/13/2022   NA 134 (L) 09/13/2022   K 4.6 09/13/2022   CL 96 09/13/2022   CO2 28 09/13/2022   BUN 8 09/13/2022   CREATININE 0.67 09/13/2022   GFRNONAA >60 09/23/2020   CALCIUM 9.1 09/13/2022   PROT 7.3 09/13/2022   ALBUMIN 4.5 09/13/2022   BILITOT 0.4 09/13/2022   ALKPHOS 62 09/13/2022   AST 66 (H) 09/13/2022   ALT 51 (H) 09/13/2022   ANIONGAP 12 09/23/2020   Last lipids Lab Results  Component Value Date   CHOL 174 09/13/2022   HDL 73.10 09/13/2022    LDLCALC 87 09/13/2022   TRIG 72.0 09/13/2022   CHOLHDL 2 09/13/2022   Last hemoglobin A1c Lab Results  Component Value Date   HGBA1C 5.6 09/16/2022   Last thyroid functions Lab Results  Component Value Date   TSH 2.76 09/13/2022   T3TOTAL 135 07/29/2019   IMPRESSION AND PLAN:  #1 severe generalized anxiety, panic. Uncontrolled, primarily because of ongoing alcohol abuse. She has so far gotten a little bit of benefit from taking a 10 mg propranolol tab about 30 minutes ago. I encouraged her to go ahead  and take 2 of these up to 3 times a day as needed for anxiety. Continue Wellbutrin XL 150 mg daily and sertraline 100 mg a day.  #2 hypertension, elevated blood pressure in office. I suspect that her blood pressure is normal when she is not highly anxious. Continue Coreg 25 mg twice daily, HCTZ 25 mg a day, and irbesartan 150 mg a day. Electrolytes and creatinine were normal about 2 months ago.  3.  Alcoholism.  Ongoing abuse.  Encouraged patient to get back in rehab.  She is contemplating this but not at the point where she is ready yet.  I advised against quitting "cold [place]" because of risk of dangerous withdrawal.  An After Visit Summary was printed and given to the patient.  FOLLOW UP: Return in about 4 months (around 03/19/2023) for routine chronic illness f/u. Next cpe 09/2023  Signed:  Crissie Sickles, MD           11/18/2022

## 2022-12-11 ENCOUNTER — Other Ambulatory Visit: Payer: Self-pay | Admitting: Family Medicine

## 2022-12-20 ENCOUNTER — Other Ambulatory Visit: Payer: Self-pay | Admitting: Family Medicine

## 2023-01-13 ENCOUNTER — Other Ambulatory Visit: Payer: Self-pay | Admitting: Family Medicine

## 2023-03-17 ENCOUNTER — Ambulatory Visit: Payer: BC Managed Care – PPO | Admitting: Family Medicine

## 2023-03-17 ENCOUNTER — Encounter: Payer: Self-pay | Admitting: Family Medicine

## 2023-03-17 VITALS — BP 130/84 | HR 95 | Ht 65.75 in | Wt 163.0 lb

## 2023-03-17 DIAGNOSIS — R7401 Elevation of levels of liver transaminase levels: Secondary | ICD-10-CM

## 2023-03-17 DIAGNOSIS — G629 Polyneuropathy, unspecified: Secondary | ICD-10-CM | POA: Diagnosis not present

## 2023-03-17 DIAGNOSIS — I10 Essential (primary) hypertension: Secondary | ICD-10-CM

## 2023-03-17 DIAGNOSIS — M79675 Pain in left toe(s): Secondary | ICD-10-CM

## 2023-03-17 DIAGNOSIS — R200 Anesthesia of skin: Secondary | ICD-10-CM | POA: Diagnosis not present

## 2023-03-17 DIAGNOSIS — F1020 Alcohol dependence, uncomplicated: Secondary | ICD-10-CM

## 2023-03-17 DIAGNOSIS — M79674 Pain in right toe(s): Secondary | ICD-10-CM

## 2023-03-17 DIAGNOSIS — F411 Generalized anxiety disorder: Secondary | ICD-10-CM

## 2023-03-17 MED ORDER — THIAMINE HCL 100 MG PO TABS: 100.0000 mg | ORAL_TABLET | Freq: Every day | ORAL | 1 refills | Status: DC

## 2023-03-17 NOTE — Patient Instructions (Signed)
Take 100 mg of thiamine (vitamin B1) every day.

## 2023-03-17 NOTE — Progress Notes (Signed)
OFFICE VISIT  03/17/2023  CC:  Chief Complaint  Patient presents with   Medical Management of Chronic Issues    Patient is a 47 y.o. female who presents for 74-month follow-up hypertension, anxiety, alcoholism. A/P as of last visit: "#1 severe generalized anxiety, panic. Uncontrolled, primarily because of ongoing alcohol abuse. She has so far gotten a little bit of benefit from taking a 10 mg propranolol tab about 30 minutes ago. I encouraged her to go ahead and take 2 of these up to 3 times a day as needed for anxiety. Continue Wellbutrin XL 150 mg daily and sertraline 100 mg a day.   #2 hypertension, elevated blood pressure in office. I suspect that her blood pressure is normal when she is not highly anxious. Continue Coreg 25 mg twice daily, HCTZ 25 mg a day, and irbesartan 150 mg a day. Electrolytes and creatinine were normal about 2 months ago.   3.  Alcoholism.  Ongoing abuse.  Encouraged patient to get back in rehab.  She is contemplating this but not at the point where she is ready yet.  I advised against quitting "cold [place]" because of risk of dangerous withdrawal."  INTERIM HX: Anxiety has been bad.  Still drinking heavily.  Has quit smoking over the last month or so, though. Not checking blood pressures at home. She does not drink much water. Gets tremulous on and off during the day lately.  States she does not feel significant withdrawal symptoms in the daytime.  She is not sure if her tremulousness is related to anxiety or not. Last 6 weeks or so she has noted numbness in the toes and fingertips bilaterally (mild burning in toes as well).  Feet feel cold sometimes but no real noticeable color changes.  No focal weakness.  ROS as above, plus--> no fevers, no CP, no SOB, no wheezing, no cough, no dizziness, no HAs, no rashes, no melena/hematochezia.  No polyuria or polydipsia.  No myalgias or arthralgias.  No acute vision or hearing abnormalities.  No dysuria or unusual/new  urinary urgency or frequency.  No recent changes in lower legs. No n/v/d or abd pain.  No palpitations.    Past Medical History:  Diagnosis Date   Alcoholism ([facility])    Ongoing (many years) of abuse until 05/17/2019 admission for detox.   Anxiety    Elevated transaminase level    likely secondary to alcohol.  Viral Hep screens normal.   Hypertension    IFG (impaired fasting glucose)    08/2022 fasting 109.  Hba1c 5.6%   Left ankle sprain 09/13/2017   [place] Priority care: x-ray NORMAL 09/13/17.   Rotator cuff tendonitis, right 2021   recalcitrant ->MRI 12/2019--tiny low grade partial thickness bursal region teres minor tear   Tobacco dependence    ongoing as of 05/2019    Past Surgical History:  Procedure Laterality Date   BREAST BIOPSY Right 07/2018   benign (fibroadenoma)   CATARACT EXTRACTION W/ INTRAOCULAR LENS  IMPLANT, BILATERAL  2008 and 2010   CERVICAL CONE BIOPSY     for HPV   MOUTH SURGERY  2022   WISDOM TOOTH EXTRACTION      Outpatient Medications Prior to Visit  Medication Sig Dispense Refill   buPROPion (WELLBUTRIN XL) 150 MG 24 hr tablet TAKE 1 TABLET BY MOUTH EVERY DAY IN THE MORNING. 90 tablet 1   carvedilol (COREG) 25 MG tablet Take 1 tablet (25 mg total) by mouth 2 (two) times daily. 180 tablet 1  hydrochlorothiazide (HYDRODIURIL) 25 MG tablet TAKE 1 TABLET BY MOUTH EVERY DAY IN THE MORNING 90 tablet 1   irbesartan (AVAPRO) 150 MG tablet Take 1 tablet (150 mg total) by mouth daily. 90 tablet 1   norethindrone (MICRONOR) 0.35 MG tablet Take 1 tablet (0.35 mg total) by mouth daily. 84 tablet 1   pantoprazole (PROTONIX) 40 MG tablet Take 1 tablet (40 mg total) by mouth daily. 90 tablet 1   propranolol (INDERAL) 10 MG tablet TAKE 1-2 TABS BY MOUTH TWICE DAILY AS NEEDED FOR ANXIETY 60 tablet 2   sertraline (ZOLOFT) 100 MG tablet TAKE 2 TABLETS BY MOUTH EVERY DAY 180 tablet 1   No facility-administered medications prior to visit.    Allergies  Allergen Reactions    Lisinopril     Angioedema   Epinephrine Other (See Comments)    Tachycardia, dizziness   Sulfa Antibiotics      ? reaction    Review of Systems As per HPI  PE:    03/17/2023    2:05 PM 03/17/2023    1:55 PM 11/18/2022    2:26 PM  Vitals with BMI  Height  5' 5.75"   Weight  163 lbs   BMI  26.51   Systolic 130 154 161  Diastolic 84 97 89  Pulse  95    Initial bp today 154/97, HR 95  Physical Exam  Gen: Alert, anxious, pleasant, oriented x 4. No icterus. Regular rhythm and rate without murmur. Clear to auscultation bilaterally, nonlabored. Extremities: No edema, pallor, cyanosis, or rubor. Decreased sensation to monofilament testing on distal aspect of all toes, more so on the first through third digits. Fingers sensation normal to monofilament testing. Mild tremor in both hands when arms held outstretched.  LABS:  Last CBC Lab Results  Component Value Date   WBC 7.2 09/13/2022   HGB 13.6 09/13/2022   HCT 40.1 09/13/2022   MCV 100.8 (H) 09/13/2022   MCH 33.8 09/23/2020   RDW 12.3 09/13/2022   PLT 289.0 09/13/2022   Last metabolic panel Lab Results  Component Value Date   GLUCOSE 109 (H) 09/13/2022   NA 134 (L) 09/13/2022   K 4.6 09/13/2022   CL 96 09/13/2022   CO2 28 09/13/2022   BUN 8 09/13/2022   CREATININE 0.67 09/13/2022   GFRNONAA >60 09/23/2020   CALCIUM 9.1 09/13/2022   PROT 7.3 09/13/2022   ALBUMIN 4.5 09/13/2022   BILITOT 0.4 09/13/2022   ALKPHOS 62 09/13/2022   AST 66 (H) 09/13/2022   ALT 51 (H) 09/13/2022   ANIONGAP 12 09/23/2020   Last lipids Lab Results  Component Value Date   CHOL 174 09/13/2022   HDL 73.10 09/13/2022   LDLCALC 87 09/13/2022   TRIG 72.0 09/13/2022   CHOLHDL 2 09/13/2022   Last hemoglobin A1c Lab Results  Component Value Date   HGBA1C 5.6 09/16/2022   Last thyroid functions Lab Results  Component Value Date   TSH 2.76 09/13/2022   T3TOTAL 135 07/29/2019   IMPRESSION AND PLAN:  #1 numbness in toes and  fingers--> alcoholic neuropathy versus vitamin B12 deficiency. Check vitamin B12 level, vitamin B1 level, thyroid level, hep B surface antigen, hep C antibody. At follow-up in 1 week we will discuss possible trial of gabapentin if appropriate. May have to ask neurology to see her.  #2 severe generalized anxiety, panic. Uncontrolled, primarily because of ongoing alcohol abuse. Continue Wellbutrin XL 150 mg daily and sertraline 100 mg a day.   #3 hypertension,  elevated blood pressure in office initially today.  Repeat 130/84.. I suspect that her blood pressure is normal when she is not highly anxious. Continue Coreg 25 mg twice daily, HCTZ 25 mg a day, and irbesartan 150 mg a day, but if BUN/creatinine up on labs today we will have to adjust this, possibly get off HCTZ and irbesartan altogether. Monitor blood pressure daily and we will review these at follow-up in 1 week.  #4.  Alcoholism.  Ongoing abuse. Encouraged cessation.  She is not considering cessation at this time.  An After Visit Summary was printed and given to the patient.  FOLLOW UP: Return in about 1 week (around 03/24/2023) for f/u bp and neuropathy. Next cpe 09/2023 Signed:  Santiago Bumpers, MD           03/17/2023

## 2023-03-18 LAB — COMPREHENSIVE METABOLIC PANEL
ALT: 56 U/L — ABNORMAL HIGH (ref 0–35)
AST: 85 U/L — ABNORMAL HIGH (ref 0–37)
Albumin: 4.9 g/dL (ref 3.5–5.2)
Alkaline Phosphatase: 67 U/L (ref 39–117)
BUN: 13 mg/dL (ref 6–23)
CO2: 25 mEq/L (ref 19–32)
Calcium: 10.1 mg/dL (ref 8.4–10.5)
Chloride: 93 mEq/L — ABNORMAL LOW (ref 96–112)
Creatinine, Ser: 0.78 mg/dL (ref 0.40–1.20)
GFR: 90.7 mL/min (ref >60.00)
Glucose, Bld: 112 mg/dL — ABNORMAL HIGH (ref 70–99)
Potassium: 4.3 mEq/L (ref 3.5–5.1)
Sodium: 132 mEq/L — ABNORMAL LOW (ref 135–145)
Total Bilirubin: 0.9 mg/dL (ref 0.2–1.2)
Total Protein: 8.5 g/dL — ABNORMAL HIGH (ref 6.0–8.3)

## 2023-03-18 LAB — TSH: TSH: 2.58 u[IU]/mL (ref 0.35–5.50)

## 2023-03-18 LAB — VITAMIN B12: Vitamin B-12: 401 pg/mL (ref 211–911)

## 2023-03-18 LAB — MAGNESIUM: Magnesium: 1.1 mg/dL — ABNORMAL LOW (ref 1.5–2.5)

## 2023-03-20 LAB — VITAMIN B1: Vitamin B1 (Thiamine): <6 nmol/L — ABNORMAL LOW (ref 8–30)

## 2023-03-20 LAB — HEPATITIS C ANTIBODY: Hepatitis C Ab: NONREACTIVE

## 2023-03-20 LAB — HEPATITIS B SURFACE ANTIGEN: Hepatitis B Surface Ag: NONREACTIVE

## 2023-03-21 ENCOUNTER — Telehealth: Payer: Self-pay | Admitting: Family Medicine

## 2023-03-21 ENCOUNTER — Encounter: Payer: Self-pay | Admitting: Family Medicine

## 2023-03-21 ENCOUNTER — Ambulatory Visit: Payer: BC Managed Care – PPO | Admitting: Family Medicine

## 2023-03-21 MED ORDER — MAGNESIUM CHLORIDE 64 MG PO TBEC: 2.0000 | DELAYED_RELEASE_TABLET | Freq: Every day | ORAL | 0 refills | Status: DC

## 2023-03-21 NOTE — Telephone Encounter (Signed)
Pt has questions about her labs that were done on 6/3. She is scheduled for an appt on Monday for follow up. Please advise.

## 2023-03-21 NOTE — Telephone Encounter (Signed)
Pt advised of results/recommendations. 

## 2023-03-21 NOTE — Telephone Encounter (Signed)
My apologies. She has 3 lab abnormalities and all of these are a direct result of excessive alcohol use on a chronic basis.  1) Vitamin B1 level is low.  This is thiamine.  At last visit we discussed starting thiamine 100 mg a day.  I sent the prescription to her pharmacy a few days ago.  2) Liver enzymes mildly elevated, similar to past measurements.  3) magnesium slightly low. I sent in rx to take for this.  I will recheck magnesium and vitamin B1/thiamine level in 2 wks.  Thx.  --PM

## 2023-03-21 NOTE — Telephone Encounter (Signed)
Patient called with concerns about abnormal lab/ out of range labs. She has not received a call yet regarding labs that came back 6/3.  Please give the patient a call today if possible. She is aware she has an appointment Monday, but thought she would've received a call by now to discuss results.

## 2023-03-24 ENCOUNTER — Ambulatory Visit: Payer: BC Managed Care – PPO | Admitting: Family Medicine

## 2023-03-24 NOTE — Progress Notes (Deleted)
OFFICE VISIT  03/24/2023  CC: No chief complaint on file.   Patient is a 47 y.o. female who presents for 1 week follow-up hypertension and paresthesias. A/P as of last visit: "1 numbness in toes and fingers--> alcoholic neuropathy versus vitamin B12 deficiency. Check vitamin B12 level, vitamin B1 level, thyroid level, hep B surface antigen, hep C antibody. At follow-up in 1 week we will discuss possible trial of gabapentin if appropriate. May have to ask neurology to see her.   #2 severe generalized anxiety, panic. Uncontrolled, primarily because of ongoing alcohol abuse. Continue Wellbutrin XL 150 mg daily and sertraline 100 mg a day.   #3 hypertension, elevated blood pressure in office initially today.  Repeat 130/84.. I suspect that her blood pressure is normal when she is not highly anxious. Continue Coreg 25 mg twice daily, HCTZ 25 mg a day, and irbesartan 150 mg a day, but if BUN/creatinine up on labs today we will have to adjust this, possibly get off HCTZ and irbesartan altogether. Monitor blood pressure daily and we will review these at follow-up in 1 week.   #4.  Alcoholism.  Ongoing abuse. Encouraged cessation.  She is not considering cessation at this time."  INTERIM HX: *** Labs reviewed with patient: Thiamine low, AST and ALT mildly elevated but stable, MCV mildly elevated.  Otherwise labs normal, including viral hepatitis panel.  Past Medical History:  Diagnosis Date   Alcoholism (HCC)    Ongoing (many years) of abuse until 05/17/2019 admission for detox.   Anxiety    Elevated transaminase level    likely secondary to alcohol.  Viral Hep screens normal.   Hypertension    IFG (impaired fasting glucose)    08/2022 fasting 109.  Hba1c 5.6%   Magnesium deficiency    Rotator cuff tendonitis, right 2021   recalcitrant ->MRI 12/2019--tiny low grade partial thickness bursal region teres minor tear   Thiamin deficiency    Tobacco dependence    quit 2024    Past  Surgical History:  Procedure Laterality Date   BREAST BIOPSY Right 07/2018   benign (fibroadenoma)   CATARACT EXTRACTION W/ INTRAOCULAR LENS  IMPLANT, BILATERAL  2008 and 2010   CERVICAL CONE BIOPSY     for HPV   MOUTH SURGERY  2022   WISDOM TOOTH EXTRACTION      Outpatient Medications Prior to Visit  Medication Sig Dispense Refill   buPROPion (WELLBUTRIN XL) 150 MG 24 hr tablet TAKE 1 TABLET BY MOUTH EVERY DAY IN THE MORNING. 90 tablet 1   carvedilol (COREG) 25 MG tablet Take 1 tablet (25 mg total) by mouth 2 (two) times daily. 180 tablet 1   hydrochlorothiazide (HYDRODIURIL) 25 MG tablet TAKE 1 TABLET BY MOUTH EVERY DAY IN THE MORNING 90 tablet 1   irbesartan (AVAPRO) 150 MG tablet Take 1 tablet (150 mg total) by mouth daily. 90 tablet 1   magnesium chloride (SLOW-MAG) 64 MG TBEC SR tablet Take 2 tablets (128 mg total) by mouth daily. 30 tablet 0   norethindrone (MICRONOR) 0.35 MG tablet Take 1 tablet (0.35 mg total) by mouth daily. 84 tablet 1   pantoprazole (PROTONIX) 40 MG tablet Take 1 tablet (40 mg total) by mouth daily. 90 tablet 1   propranolol (INDERAL) 10 MG tablet TAKE 1-2 TABS BY MOUTH TWICE DAILY AS NEEDED FOR ANXIETY 60 tablet 2   sertraline (ZOLOFT) 100 MG tablet TAKE 2 TABLETS BY MOUTH EVERY DAY 180 tablet 1   thiamine (VITAMIN B1) 100 MG  tablet Take 1 tablet (100 mg total) by mouth daily. 90 tablet 1   No facility-administered medications prior to visit.    Allergies  Allergen Reactions   Lisinopril     Angioedema   Epinephrine Other (See Comments)    Tachycardia, dizziness   Sulfa Antibiotics      ? reaction    Review of Systems As per HPI  PE:    03/17/2023    2:05 PM 03/17/2023    1:55 PM 11/18/2022    2:26 PM  Vitals with BMI  Height  5' 5.75"   Weight  163 lbs   BMI  26.51   Systolic 130 154 409  Diastolic 84 97 89  Pulse  95      Physical Exam  ***  LABS:  Last CBC Lab Results  Component Value Date   WBC 7.2 09/13/2022   HGB 13.6  09/13/2022   HCT 40.1 09/13/2022   MCV 100.8 (H) 09/13/2022   MCH 33.8 09/23/2020   RDW 12.3 09/13/2022   PLT 289.0 09/13/2022   Last metabolic panel Lab Results  Component Value Date   GLUCOSE 112 (H) 03/17/2023   NA 132 (L) 03/17/2023   K 4.3 03/17/2023   CL 93 (L) 03/17/2023   CO2 25 03/17/2023   BUN 13 03/17/2023   CREATININE 0.78 03/17/2023   GFRNONAA >60 09/23/2020   CALCIUM 10.1 03/17/2023   PROT 8.5 (H) 03/17/2023   ALBUMIN 4.9 03/17/2023   BILITOT 0.9 03/17/2023   ALKPHOS 67 03/17/2023   AST 85 (H) 03/17/2023   ALT 56 (H) 03/17/2023   ANIONGAP 12 09/23/2020   Last hemoglobin A1c Lab Results  Component Value Date   HGBA1C 5.6 09/16/2022   Last thyroid functions Lab Results  Component Value Date   TSH 2.58 03/17/2023   T3TOTAL 135 07/29/2019   Last vitamin B12 and Folate Lab Results  Component Value Date   VITAMINB12 401 03/17/2023   FOLATE 3.8 (L) 03/13/2022   IMPRESSION AND PLAN:  No problem-specific Assessment & Plan notes found for this encounter.  Start folic acid  An After Visit Summary was printed and given to the patient.  FOLLOW UP: No follow-ups on file.  Signed:  Santiago Bumpers, MD           03/24/2023

## 2023-04-02 ENCOUNTER — Encounter: Payer: Self-pay | Admitting: Family Medicine

## 2023-04-07 ENCOUNTER — Other Ambulatory Visit: Payer: Self-pay | Admitting: Family Medicine

## 2023-04-07 ENCOUNTER — Ambulatory Visit: Payer: BC Managed Care – PPO | Admitting: Family Medicine

## 2023-04-07 NOTE — Progress Notes (Deleted)
OFFICE VISIT  04/07/2023  CC: No chief complaint on file.   Patient is a 47 y.o. female who presents for 3-week follow-up neuropathy and hypertension. A/P as of last visit: "#1 numbness in toes and fingers--> alcoholic neuropathy versus vitamin B12 deficiency. Check vitamin B12 level, vitamin B1 level, thyroid level, hep B surface antigen, hep C antibody. At follow-up in 1 week we will discuss possible trial of gabapentin if appropriate. May have to ask neurology to see her.   #2 severe generalized anxiety, panic. Uncontrolled, primarily because of ongoing alcohol abuse. Continue Wellbutrin XL 150 mg daily and sertraline 100 mg a day.   #3 hypertension, elevated blood pressure in office initially today.  Repeat 130/84.. I suspect that her blood pressure is normal when she is not highly anxious. Continue Coreg 25 mg twice daily, HCTZ 25 mg a day, and irbesartan 150 mg a day, but if BUN/creatinine up on labs today we will have to adjust this, possibly get off HCTZ and irbesartan altogether. Monitor blood pressure daily and we will review these at follow-up in 1 week.   #4.  Alcoholism.  Ongoing abuse. Encouraged cessation.  She is not considering cessation at this time."  INTERIM HX: ***  Thiamine and magnesium returned slightly low last visit.  She has been started on supplement of both.   Past Medical History:  Diagnosis Date   Alcoholism (HCC)    Ongoing (many years) of abuse until 05/17/2019 admission for detox.   Anxiety    Elevated transaminase level    likely secondary to alcohol.  Viral Hep screens normal.   Hypertension    IFG (impaired fasting glucose)    08/2022 fasting 109.  Hba1c 5.6%   Magnesium deficiency    Rotator cuff tendonitis, right 2021   recalcitrant ->MRI 12/2019--tiny low grade partial thickness bursal region teres minor tear   Thiamin deficiency    Tobacco dependence    quit 2024    Past Surgical History:  Procedure Laterality Date   BREAST  BIOPSY Right 07/2018   benign (fibroadenoma)   CATARACT EXTRACTION W/ INTRAOCULAR LENS  IMPLANT, BILATERAL  2008 and 2010   CERVICAL CONE BIOPSY     for HPV   MOUTH SURGERY  2022   WISDOM TOOTH EXTRACTION      Outpatient Medications Prior to Visit  Medication Sig Dispense Refill   buPROPion (WELLBUTRIN XL) 150 MG 24 hr tablet TAKE 1 TABLET BY MOUTH EVERY DAY IN THE MORNING. 90 tablet 1   carvedilol (COREG) 25 MG tablet Take 1 tablet (25 mg total) by mouth 2 (two) times daily. 180 tablet 1   hydrochlorothiazide (HYDRODIURIL) 25 MG tablet TAKE 1 TABLET BY MOUTH EVERY DAY IN THE MORNING 90 tablet 1   irbesartan (AVAPRO) 150 MG tablet Take 1 tablet (150 mg total) by mouth daily. 90 tablet 1   magnesium chloride (SLOW-MAG) 64 MG TBEC SR tablet Take 2 tablets (128 mg total) by mouth daily. 30 tablet 0   norethindrone (MICRONOR) 0.35 MG tablet Take 1 tablet (0.35 mg total) by mouth daily. 84 tablet 1   pantoprazole (PROTONIX) 40 MG tablet Take 1 tablet (40 mg total) by mouth daily. 90 tablet 1   propranolol (INDERAL) 10 MG tablet TAKE 1-2 TABS BY MOUTH TWICE DAILY AS NEEDED FOR ANXIETY 60 tablet 2   sertraline (ZOLOFT) 100 MG tablet TAKE 2 TABLETS BY MOUTH EVERY DAY 180 tablet 1   thiamine (VITAMIN B1) 100 MG tablet Take 1 tablet (100 mg  total) by mouth daily. 90 tablet 1   No facility-administered medications prior to visit.    Allergies  Allergen Reactions   Lisinopril     Angioedema   Epinephrine Other (See Comments)    Tachycardia, dizziness   Sulfa Antibiotics      ? reaction    Review of Systems As per HPI  PE:    03/17/2023    2:05 PM 03/17/2023    1:55 PM 11/18/2022    2:26 PM  Vitals with BMI  Height  5' 5.75"   Weight  163 lbs   BMI  26.51   Systolic 130 154 324  Diastolic 84 97 89  Pulse  95      Physical Exam  ***  LABS:  Last CBC Lab Results  Component Value Date   WBC 7.2 09/13/2022   HGB 13.6 09/13/2022   HCT 40.1 09/13/2022   MCV 100.8 (H)  09/13/2022   MCH 33.8 09/23/2020   RDW 12.3 09/13/2022   PLT 289.0 09/13/2022   Last metabolic panel Lab Results  Component Value Date   GLUCOSE 112 (H) 03/17/2023   NA 132 (L) 03/17/2023   K 4.3 03/17/2023   CL 93 (L) 03/17/2023   CO2 25 03/17/2023   BUN 13 03/17/2023   CREATININE 0.78 03/17/2023   GFRNONAA >60 09/23/2020   CALCIUM 10.1 03/17/2023   PROT 8.5 (H) 03/17/2023   ALBUMIN 4.9 03/17/2023   BILITOT 0.9 03/17/2023   ALKPHOS 67 03/17/2023   AST 85 (H) 03/17/2023   ALT 56 (H) 03/17/2023   ANIONGAP 12 09/23/2020   Last lipids Lab Results  Component Value Date   CHOL 174 09/13/2022   HDL 73.10 09/13/2022   LDLCALC 87 09/13/2022   TRIG 72.0 09/13/2022   CHOLHDL 2 09/13/2022   Last hemoglobin A1c Lab Results  Component Value Date   HGBA1C 5.6 09/16/2022   Last thyroid functions Lab Results  Component Value Date   TSH 2.58 03/17/2023   T3TOTAL 135 07/29/2019   Last vitamin B12 and Folate Lab Results  Component Value Date   VITAMINB12 401 03/17/2023   FOLATE 3.8 (L) 03/13/2022   IMPRESSION AND PLAN:  No problem-specific Assessment & Plan notes found for this encounter.  ?rpt mag , thiamine, and LFTs?***  An After Visit Summary was printed and given to the patient.  FOLLOW UP: No follow-ups on file.  Signed:  Santiago Bumpers, MD           04/07/2023

## 2023-04-08 ENCOUNTER — Ambulatory Visit: Payer: BC Managed Care – PPO | Admitting: Family Medicine

## 2023-04-08 ENCOUNTER — Encounter: Payer: Self-pay | Admitting: Family Medicine

## 2023-04-08 VITALS — BP 113/81 | HR 89 | Wt 164.2 lb

## 2023-04-08 DIAGNOSIS — E519 Thiamine deficiency, unspecified: Secondary | ICD-10-CM

## 2023-04-08 DIAGNOSIS — E441 Mild protein-calorie malnutrition: Secondary | ICD-10-CM

## 2023-04-08 DIAGNOSIS — E612 Magnesium deficiency: Secondary | ICD-10-CM | POA: Diagnosis not present

## 2023-04-08 DIAGNOSIS — F411 Generalized anxiety disorder: Secondary | ICD-10-CM

## 2023-04-08 DIAGNOSIS — F331 Major depressive disorder, recurrent, moderate: Secondary | ICD-10-CM

## 2023-04-08 DIAGNOSIS — G4719 Other hypersomnia: Secondary | ICD-10-CM | POA: Diagnosis not present

## 2023-04-08 DIAGNOSIS — F1020 Alcohol dependence, uncomplicated: Secondary | ICD-10-CM

## 2023-04-08 DIAGNOSIS — R5383 Other fatigue: Secondary | ICD-10-CM

## 2023-04-08 DIAGNOSIS — R0683 Snoring: Secondary | ICD-10-CM

## 2023-04-08 DIAGNOSIS — R7401 Elevation of levels of liver transaminase levels: Secondary | ICD-10-CM

## 2023-04-08 DIAGNOSIS — G621 Alcoholic polyneuropathy: Secondary | ICD-10-CM | POA: Diagnosis not present

## 2023-04-08 LAB — CBC WITH DIFFERENTIAL/PLATELET
Basophils Absolute: 0 10*3/uL (ref 0.0–0.1)
Basophils Relative: 0.7 % (ref 0.0–3.0)
Eosinophils Absolute: 0.1 10*3/uL (ref 0.0–0.7)
Eosinophils Relative: 1.8 % (ref 0.0–5.0)
HCT: 35.6 % — ABNORMAL LOW (ref 36.0–46.0)
Hemoglobin: 12 g/dL (ref 12.0–15.0)
Lymphocytes Relative: 39.9 % (ref 12.0–46.0)
Lymphs Abs: 1.7 10*3/uL (ref 0.7–4.0)
MCHC: 33.7 g/dL (ref 30.0–36.0)
MCV: 103 fl — ABNORMAL HIGH (ref 78.0–100.0)
Monocytes Absolute: 0.4 10*3/uL (ref 0.1–1.0)
Monocytes Relative: 9.1 % (ref 3.0–12.0)
Neutro Abs: 2.1 10*3/uL (ref 1.4–7.7)
Neutrophils Relative %: 48.5 % (ref 43.0–77.0)
Platelets: 246 10*3/uL (ref 150.0–400.0)
RBC: 3.46 Mil/uL — ABNORMAL LOW (ref 3.87–5.11)
RDW: 13.4 % (ref 11.5–15.5)
WBC: 4.4 10*3/uL (ref 4.0–10.5)

## 2023-04-08 LAB — COMPREHENSIVE METABOLIC PANEL
ALT: 76 U/L — ABNORMAL HIGH (ref 0–35)
AST: 109 U/L — ABNORMAL HIGH (ref 0–37)
Albumin: 4.6 g/dL (ref 3.5–5.2)
Alkaline Phosphatase: 56 U/L (ref 39–117)
BUN: 10 mg/dL (ref 6–23)
CO2: 25 mEq/L (ref 19–32)
Calcium: 9.8 mg/dL (ref 8.4–10.5)
Chloride: 89 mEq/L — ABNORMAL LOW (ref 96–112)
Creatinine, Ser: 0.6 mg/dL (ref 0.40–1.20)
GFR: 107.14 mL/min (ref >60.00)
Glucose, Bld: 93 mg/dL (ref 70–99)
Potassium: 4 mEq/L (ref 3.5–5.1)
Sodium: 126 mEq/L — ABNORMAL LOW (ref 135–145)
Total Bilirubin: 0.6 mg/dL (ref 0.2–1.2)
Total Protein: 7.8 g/dL (ref 6.0–8.3)

## 2023-04-08 LAB — MAGNESIUM: Magnesium: 1.5 mg/dL (ref 1.5–2.5)

## 2023-04-08 MED ORDER — FOLIC ACID 0.8 MG PO CAPS: ORAL_CAPSULE | ORAL | 3 refills | Status: DC

## 2023-04-08 NOTE — Telephone Encounter (Signed)
Pt has appt today

## 2023-04-08 NOTE — Progress Notes (Signed)
OFFICE VISIT  04/08/2023  CC:  Chief Complaint  Patient presents with   Peripheral Neuropathy    And bp. Still doing not doing good. Lots of fatigue in the past week    Patient is a 47 y.o. female who presents for 3-week follow-up neuropathy and hypertension. A/P as of last visit: "#1 numbness in toes and fingers--> alcoholic neuropathy versus vitamin B12 deficiency. Check vitamin B12 level, vitamin B1 level, thyroid level, hep B surface antigen, hep C antibody. At follow-up in 1 week we will discuss possible trial of gabapentin if appropriate. May have to ask neurology to see her.   #2 severe generalized anxiety, panic. Uncontrolled, primarily because of ongoing alcohol abuse. Continue Wellbutrin XL 150 mg daily and sertraline 100 mg a day.   #3 hypertension, elevated blood pressure in office initially today.  Repeat 130/84.. I suspect that her blood pressure is normal when she is not highly anxious. Continue Coreg 25 mg twice daily, HCTZ 25 mg a day, and irbesartan 150 mg a day, but if BUN/creatinine up on labs today we will have to adjust this, possibly get off HCTZ and irbesartan altogether. Monitor blood pressure daily and we will review these at follow-up in 1 week.   #4.  Alcoholism.  Ongoing abuse. Encouraged cessation.  She is not considering cessation at this time"  INTERIM HX: Complains of progressive fatigue and excessive daytime sleepiness lately.  She does snore.  No awakening with gasping.  No morning headaches. Sleep separate from her husband so does not know whether any apneic events have been observed.  Thiamine and magnesium low at last visit.  She has been started on supplements of both. Continues to drink alcohol significant amounts.  No home blood pressure monitoring. Numbness in toes is unchanged.  ROS: No fever, no hands or legs weakness. No back pain. No tremors.   Past Medical History:  Diagnosis Date   Alcoholism (HCC)    Ongoing (many years)  of abuse until 05/17/2019 admission for detox.   Anxiety    Elevated transaminase level    likely secondary to alcohol.  Viral Hep screens normal.   Hypertension    IFG (impaired fasting glucose)    08/2022 fasting 109.  Hba1c 5.6%   Magnesium deficiency    Rotator cuff tendonitis, right 2021   recalcitrant ->MRI 12/2019--tiny low grade partial thickness bursal region teres minor tear   Thiamin deficiency    Tobacco dependence    quit 2024    Past Surgical History:  Procedure Laterality Date   BREAST BIOPSY Right 07/2018   benign (fibroadenoma)   CATARACT EXTRACTION W/ INTRAOCULAR LENS  IMPLANT, BILATERAL  2008 and 2010   CERVICAL CONE BIOPSY     for HPV   MOUTH SURGERY  2022   WISDOM TOOTH EXTRACTION      Outpatient Medications Prior to Visit  Medication Sig Dispense Refill   buPROPion (WELLBUTRIN XL) 150 MG 24 hr tablet TAKE 1 TABLET BY MOUTH EVERY DAY IN THE MORNING. 90 tablet 1   carvedilol (COREG) 25 MG tablet Take 1 tablet (25 mg total) by mouth 2 (two) times daily. 180 tablet 1   hydrochlorothiazide (HYDRODIURIL) 25 MG tablet TAKE 1 TABLET BY MOUTH EVERY DAY IN THE MORNING 90 tablet 1   irbesartan (AVAPRO) 150 MG tablet Take 1 tablet (150 mg total) by mouth daily. 90 tablet 1   magnesium chloride (SLOW-MAG) 64 MG TBEC SR tablet Take 2 tablets (128 mg total) by mouth daily. 30  tablet 0   norethindrone (MICRONOR) 0.35 MG tablet Take 1 tablet (0.35 mg total) by mouth daily. 84 tablet 1   pantoprazole (PROTONIX) 40 MG tablet Take 1 tablet (40 mg total) by mouth daily. 90 tablet 1   propranolol (INDERAL) 10 MG tablet TAKE 1-2 TABS BY MOUTH TWICE DAILY AS NEEDED FOR ANXIETY 60 tablet 2   sertraline (ZOLOFT) 100 MG tablet TAKE 2 TABLETS BY MOUTH EVERY DAY 180 tablet 1   thiamine (VITAMIN B1) 100 MG tablet Take 1 tablet (100 mg total) by mouth daily. 90 tablet 1   No facility-administered medications prior to visit.    Allergies  Allergen Reactions   Lisinopril     Angioedema    Epinephrine Other (See Comments)    Tachycardia, dizziness   Sulfa Antibiotics      ? reaction    Review of Systems As per HPI  PE:    04/08/2023   10:16 AM 03/17/2023    2:05 PM 03/17/2023    1:55 PM  Vitals with BMI  Height   5' 5.75"  Weight 164 lbs 3 oz  163 lbs  BMI 26.71  26.51  Systolic 113 130 454  Diastolic 81 84 97  Pulse 89  95     Physical Exam  Gen: Alert, well appearing.  Patient is oriented to person, place, time, and situation. AFFECT: anxious but pleasant. Tearful at times. No further exam today  LABS:  Last CBC Lab Results  Component Value Date   WBC 7.2 09/13/2022   HGB 13.6 09/13/2022   HCT 40.1 09/13/2022   MCV 100.8 (H) 09/13/2022   MCH 33.8 09/23/2020   RDW 12.3 09/13/2022   PLT 289.0 09/13/2022   Last metabolic panel Lab Results  Component Value Date   GLUCOSE 112 (H) 03/17/2023   NA 132 (L) 03/17/2023   K 4.3 03/17/2023   CL 93 (L) 03/17/2023   CO2 25 03/17/2023   BUN 13 03/17/2023   CREATININE 0.78 03/17/2023   GFRNONAA >60 09/23/2020   CALCIUM 10.1 03/17/2023   PROT 8.5 (H) 03/17/2023   ALBUMIN 4.9 03/17/2023   BILITOT 0.9 03/17/2023   ALKPHOS 67 03/17/2023   AST 85 (H) 03/17/2023   ALT 56 (H) 03/17/2023   ANIONGAP 12 09/23/2020   Last thyroid functions Lab Results  Component Value Date   TSH 2.58 03/17/2023   T3TOTAL 135 07/29/2019   Last vitamin B12 and Folate Lab Results  Component Value Date   VITAMINB12 401 03/17/2023   FOLATE 3.8 (L) 03/13/2022   IMPRESSION AND PLAN:  #1 malnutrition secondary to alcoholism. Encouraged alcohol cessation.    #2 thiamine and magnesium deficiency. Secondary to alcoholism. Continue thiamine 100 mg a day and magnesium 64 mg, 2 tabs daily. Start folic acid.   Recheck thiamine and magnesium levels today.  #3 peripheral neuropathy (sensory). Suspect due to thiamine deficiency, +/- alcoholic neuropathy. Continue thiamine deficiency and we will give this time.  #4 chronic  fatigue/excessive daytime sleepiness.  Progressive. Monitor complete blood count and iron today. Will refer to sleep medicine for consideration of sleep study.  5.  Hypertension, great blood pressure today. Continue Coreg 25 mg twice a day, HCTZ 25 mg a day, and irbesartan 150 mg daily.  #6 alcoholism, ongoing alcohol abuse. She has GAD and major depressive disorder. She will continue Wellbutrin XL 150 mg daily and 200 mg of sertraline daily. She is not contemplating cessation of alcohol at this time but does want to start counseling.  Encouraged AA attendance as well. Counseling referral ordered today.  #7 elevated transaminase. Consistent with alcohol abuse. Viral hep serologies negative. Discussed the option of getting right upper quadrant abdominal ultrasound but she declined at this time. Repeating hepatic panel today.  An After Visit Summary was printed and given to the patient.  FOLLOW UP: Return in about 3 months (around 07/09/2023) for routine chronic illness f/u.  Signed:  Santiago Bumpers, MD           04/08/2023

## 2023-04-09 LAB — IRON,TIBC AND FERRITIN PANEL: TIBC: 276 mcg/dL (calc) (ref 250–450)

## 2023-04-12 LAB — IRON,TIBC AND FERRITIN PANEL
%SAT: 62 % (calc) — ABNORMAL HIGH (ref 16–45)
Ferritin: 1801 ng/mL — ABNORMAL HIGH (ref 16–232)
Iron: 172 ug/dL (ref 40–190)

## 2023-04-12 LAB — VITAMIN B1: Vitamin B1 (Thiamine): 18 nmol/L (ref 8–30)

## 2023-04-15 ENCOUNTER — Telehealth: Payer: Self-pay | Admitting: Family Medicine

## 2023-04-15 NOTE — Telephone Encounter (Signed)
Please review and advise on results.

## 2023-04-15 NOTE — Telephone Encounter (Signed)
Patient would like a call back to discuss lab results from her appointment on 6/25. Please give the patient a call to discuss lab results.

## 2023-05-15 ENCOUNTER — Other Ambulatory Visit: Payer: Self-pay | Admitting: Family Medicine

## 2023-05-15 ENCOUNTER — Telehealth: Payer: Self-pay | Admitting: Family Medicine

## 2023-05-15 DIAGNOSIS — R7401 Elevation of levels of liver transaminase levels: Secondary | ICD-10-CM

## 2023-05-15 DIAGNOSIS — F1020 Alcohol dependence, uncomplicated: Secondary | ICD-10-CM

## 2023-05-15 NOTE — Telephone Encounter (Signed)
Please Advise

## 2023-05-15 NOTE — Telephone Encounter (Signed)
Patient would now like to proceed with having an ultrasound done on her liver now. She would like to proceed with the referral and getting scheduled.

## 2023-05-16 NOTE — Telephone Encounter (Signed)
Pt advised and confirmed preferred imaging location. She will call them to confirm if appt needed.

## 2023-05-16 NOTE — Telephone Encounter (Signed)
Okay, Please order complete abdominal ultrasound, diagnosis elevated transaminase and alcoholism (whatever imaging location she prefers). thx

## 2023-05-18 ENCOUNTER — Ambulatory Visit (HOSPITAL_BASED_OUTPATIENT_CLINIC_OR_DEPARTMENT_OTHER): Admission: RE | Admit: 2023-05-18 | Payer: BC Managed Care – PPO | Source: Ambulatory Visit

## 2023-05-19 ENCOUNTER — Encounter: Payer: Self-pay | Admitting: Neurology

## 2023-05-19 ENCOUNTER — Ambulatory Visit (INDEPENDENT_AMBULATORY_CARE_PROVIDER_SITE_OTHER): Payer: BC Managed Care – PPO | Admitting: Neurology

## 2023-05-19 VITALS — BP 140/94 | HR 90 | Ht 64.5 in | Wt 165.0 lb

## 2023-05-19 DIAGNOSIS — F10982 Alcohol use, unspecified with alcohol-induced sleep disorder: Secondary | ICD-10-CM | POA: Insufficient documentation

## 2023-05-19 DIAGNOSIS — R251 Tremor, unspecified: Secondary | ICD-10-CM | POA: Insufficient documentation

## 2023-05-19 DIAGNOSIS — R292 Abnormal reflex: Secondary | ICD-10-CM | POA: Insufficient documentation

## 2023-05-19 DIAGNOSIS — R0683 Snoring: Secondary | ICD-10-CM | POA: Diagnosis not present

## 2023-05-19 MED ORDER — PRENATAL VITAMIN AND MINERAL 28-0.8 MG PO TABS: ORAL_TABLET | ORAL | Status: DC

## 2023-05-19 MED ORDER — TRAZODONE HCL 50 MG PO TABS: 50.0000 mg | ORAL_TABLET | Freq: Every day | ORAL | 2 refills | Status: DC

## 2023-05-19 MED ORDER — ALPRAZOLAM 0.5 MG PO TABS: 0.5000 mg | ORAL_TABLET | Freq: Two times a day (BID) | ORAL | 0 refills | Status: DC | PRN

## 2023-05-19 NOTE — Patient Instructions (Signed)
Alcohol Use Disorder Alcohol use disorder is a condition in which drinking disrupts daily life. People with this condition drink too much alcohol and cannot control their drinking. Alcohol use disorder can cause serious problems with physical health. It can affect the brain, heart, and other internal organs. This disorder can raise the risk for certain cancers and cause problems with mental health, such as depression or anxiety. What are the causes? This condition is caused by drinking too much alcohol over time. Some people with this condition drink to cope with or escape from negative life events. Others drink to relieve symptoms of physical pain or symptoms of mental illness. What increases the risk? You are more likely to develop this condition if: You have a family history of alcohol use disorder. Your culture encourages drinking to the point of becoming drunk (intoxication). You had a mood or conduct disorder in childhood. You have been abused. You are an adolescent and you: Have poor performance in school. Have poor supervision or guidance. Act on impulse and like taking risks. What are the signs or symptoms? Symptoms of this condition include: Drinking more than you want to. Trying several times without success to drink less. Spending a lot of time thinking about alcohol, getting alcohol, drinking alcohol, or recovering from drinking alcohol. Continuing to drink even when it is causing serious problems in your daily life. Drinking when it is dangerous to drink, such as before driving a car. Needing more and more alcohol to get the same effect you want (building up tolerance). Having symptoms of withdrawal when you stop drinking. Withdrawal symptoms may include: Trouble sleeping, leading to tiredness (fatigue). Mood swings of depression and anxiety. Physical symptoms, such as a fast heart rate, rapid breathing, high blood pressure (hypertension), fever, cold sweats, or  nausea. Seizures. Severe confusion. Feeling or seeing things that are not there (hallucinations). Shaking movements that you cannot control (tremors). How is this diagnosed? This condition is diagnosed with an assessment. Your health care provider may start by asking three or four questions about your drinking, or they may give you a simple test to take. This helps to get clear information from you. You may also have a physical exam or lab tests. You may be referred to a substance abuse counselor. How is this treated? With education, some people with alcohol use disorder are able to reduce their drinking. Many with this disorder cannot change their drinking behavior on their own and need help with treatment from substance use specialists. Treatments may include: Detoxification. Detoxification involves quitting drinking with supervision and direction of health care providers. Your health care provider may prescribe medicines within the first week to help lessen withdrawal symptoms. Alcohol withdrawal can be dangerous and life-threatening. Detoxification may be provided in a home, community, or primary care setting, or in a hospital or substance use treatment facility. Counseling. This may involve motivational interviewing (MI), family therapy, or cognitive behavioral therapy (CBT). A counselor can address the things you can do to change your drinking behavior and how to maintain the changes. Talk therapy aims to: Identify your positive motivations to change. Identify and avoid the things that trigger your drinking. Help you learn how to plan your behavior change. Develop support systems that can help you sustain the change. Medicines. Medicines can help treat this disorder by: Decreasing cravings. Decreasing the positive feeling you have when you drink. Causing an uncomfortable physical reaction when you drink (aversion therapy). Support groups such as Alcoholics Anonymous (AA). These groups are  led by people who have quit drinking. The groups provide emotional support, advice, and guidance. Some people with this condition benefit from a combination of treatments provided by specialized substance use treatment centers. Follow these instructions at home:  Medicines Take over-the-counter and prescription medicines only as told by your health care provider. Ask before starting any new medicines, herbs, or supplements. General instructions Ask friends and family members to support your choice to stay sober. Avoid places where alcohol is served. Create a plan to deal with tempting situations. Attend support groups regularly. Practice hobbies or activities you enjoy. Do not drink and drive. How is this prevented? Do not drink alcohol if your health care provider tells you not to drink. If you drink alcohol: Limit how much you have to: 0-1 drink a day for women who are not pregnant. 0-2 drinks a day for men. Know how much alcohol is in your drink. In the [place] one drink equals one 12 oz bottle of beer (355 mL), one 5 oz glass of wine (148 mL), or one 1 oz glass of hard liquor (44 mL). If you have a mental health condition, seek treatment. Develop a healthy lifestyle through: Meditation or deep breathing. Exercise. Spending time in nature. Listening to music. Talking with a trusted friend or family member. If you are a teen: Do not drink alcohol. Avoid gatherings where you might be tempted to drink alcohol. Do not be afraid to say no if someone offers you alcohol. Speak up about why you do not want to drink. Set a positive example for others around you by not drinking. Build relationships with friends who do not drink. Where to find more information Substance Abuse and Mental Health Services Administration: RockToxic.pl Alcoholics Anonymous: CustomizedRugs.fi Contact a health care provider if: You cannot take your medicines as told. Your symptoms get worse or you experience symptoms of  withdrawal when you stop drinking. You start drinking again (relapse) and your symptoms get worse. Get help right away if: You have thoughts about hurting yourself or others. Get help right away if you feel like you may hurt yourself or others, or have thoughts about taking your own life. Go to your nearest emergency room or: Call 911. Call the National Suicide Prevention Lifeline at 385-054-0790 or 988. This is open 24 hours a day. Text the Crisis Text Line at 6102983571. Summary Alcohol use disorder is a condition in which drinking disrupts daily life. People with this condition drink too much alcohol and cannot control their drinking. Treatment may include detoxification, counseling, medicines, and support groups. Ask friends and family members to support you. Avoid situations where alcohol is served. Get help right away if you have thoughts about hurting yourself or others. This information is not intended to replace advice given to you by your health care provider. Make sure you discuss any questions you have with your health care provider. Document Revised: 12/05/2021 Document Reviewed: 12/05/2021 Elsevier Patient Education  2024 [employer].

## 2023-05-19 NOTE — Progress Notes (Addendum)
SLEEP MEDICINE CLINIC    Provider:  Melvyn Novas, MD  Primary Care Physician:  Michelle Massed, MD [address] [place] [address]     Referring Provider: Jeoffrey Massed, Md [address] [place],  [place] [address]          Chief Complaint according to patient   Patient presents with:     New Sleep Patient (Initial Visit)     Patient is here for Sleep consult. Pt states she has excessive daytime sleepiness and feels fatigued all the time.       HISTORY OF PRESENT ILLNESS:  Michelle Myers is a 47 y.o. female patient who is seen upon referral on 05/19/2023 from Dr Michelle Myers for a Sleep Clinic consultation.     I have the pleasure of seeing Michelle Myers on 05/19/23 , a -female patient with a possible sleep disorder. She has HTN and anxiety, has been able to fall asleep whenever not stimulated and not physically active.  She was on HCTZ but developed hyponatremia, she is still using/ abusing alcohol. She quit smoking.  A few years ago sh went through a 28 day treatment for alcoholism and was sober for 14 months but relapsed.  She feels guilty and tired and embarrassed.  She reports snoring but this may be related to alcohol intake as well.  She has had a lot of anxiety and even panic attacks in the recent past.  She is taking propranolol as needed for anxiety continue on Wellbutrin 150 mg daily which helped her to quit smoking and on sertraline 100 mg a day.  Blood pressure is currently treated with Coreg, hydrochlorothiazide was discontinued after Dr. Marvel Myers had checked for electrolyte abnormalities.  Michelle Myers underwent in June some comprehensive metabolic panels and that shows an AST and ALT were elevated her blood glucose level was elevated her chloride was reduced her sodium was reduced her potassium at that time was normal.  Bilirubin was normal, alkaline phosphatase was in normal range, calcium was in normal range.  I do think that these are  most likely alcohol induced changes in addition, being on a diuretic lowers usually potassium and magnesium. Normal TSH and negative Hep B. The patient has started on folic acid, vitamin B, magnesium supplements.  I would like for her to consider taking a prenatal vitamin because it has the package of numerous healthy vitamins and it and can help her also with a mood he minimal.  Or in conjunction with the multimineral.  I would definitely not discontinue Wellbutrin or Zoloft.  We are speaking today with in the frame of my sleep clinic so I am going to pursue the physical examination necessary for that and then a screening test for sleep apnea.      Sleep relevant ROS medical history: Nocturia  may be once- Sleep walking: year ago, Night terrors, years ago-, no ENT surgery/ Tonsillectomy,  was supposed to see ENT for chronic sinusitis but hasn't yet. She is a mouth breather , her left nostril is often blocked.     Family medical /sleep history: No  other family member on CPAP with OSA, insomnia, 2 half-Brothers  ( same mother) with alcoholism.  Maternal GF and Mother was possible a closet alcoholic.    Social history:  Patient was working as a Lawyer, until recently , degree from 2021-  but gave up her job- "because of my feet'  and  lives in a household with spouse,  92 and 46 year old daughters. 1 dog and 1 cat.   Tobacco use: quit  ETOH use ; relapsed ,  Caffeine intake in form of Coffee( /) Soda( /) Tea ( /) no energy drinks Exercise in form of walking .   Hobbies :cooking       Sleep habits are as follows: The patient's dinner time is between 6-7 PM. The patient goes to bed at variable , 10-12 PM and continues to sleep for 8-10 hours, wakes rarely for bathroom breaks. Hip pain dictates her sleep position, GERD too.  The preferred sleep position is prone or sideways , with the support of 2 pillows.  Dreams are reportedly rare/ infrequent since the last 2 months. .   The patient wakes up  spontaneously sometime between 8-11 AM is the usual rise time. She reports not feeling refreshed or restored in AM, with symptoms such as dry mouth, morning headaches, and residual fatigue.  Naps are taken frequently, lasting from 45 to 60 minutes . She naps more when she can't sleep in.    Review of Systems: Out of a complete 14 system review, the patient complains of only the following symptoms, and all other reviewed systems are negative.:  Fatigue, sleepiness , snoring, fragmented sleep, Insomnia, Neuropathy    How likely are you to doze in the following situations: 0 = not likely, 1 = slight chance, 2 = moderate chance, 3 = high chance   Sitting and Reading? Watching Television? Sitting inactive in a public place (theater or meeting)? As a passenger in a car for an hour without a break? Lying down in the afternoon when circumstances permit? Sitting and talking to someone? Sitting quietly after lunch without alcohol? In a car, while stopped for a few minutes in traffic?   Total = 8/ 24 points   FSS endorsed at 50/ 63 points.   Social History   Socioeconomic History   Marital status: Married    Spouse name: Not on file   Number of children: Not on file   Years of education: Not on file   Highest education level: Not on file  Occupational History   Not on file  Tobacco Use   Smoking status: Every Day    Current packs/day: 0.75    Average packs/day: 0.8 packs/day for 24.0 years (18.0 ttl pk-yrs)    Types: Cigarettes   Smokeless tobacco: Never   Tobacco comments:    smoked 1992-present , up to 1.5 ppd  Vaping Use   Vaping status: Never Used  Substance and Sexual Activity   Alcohol use: Not Currently    Alcohol/week: 0.0 standard drinks of alcohol   Drug use: No   Sexual activity: Yes    Birth control/protection: Pill  Other Topics Concern   Not on file  Social History Narrative   Married, 2 daughters.   Educ: Bachelor's degree   Occupation: Stay at home mom.    Youngest daughter has NF type 1 (age 72 as of 12/2015)   Tob: 24 pack-yr hx.   Alc: Alcoholic+.   Detox/rehab 05/2019.     Social Determinants of Health   Financial Resource Strain: Not on file  Food Insecurity: No Food Insecurity (09/09/2022)   Hunger Vital Sign    Worried About Running Out of Food in the Last Year: Never true    Ran Out of Food in the Last Year: Never true  Transportation Needs: No Transportation Needs (09/09/2022)  PRAPARE - Administrator, Civil Service (Medical): No    Lack of Transportation (Non-Medical): No  Physical Activity: Not on file  Stress: Not on file  Social Connections: Not on file    Family History  Problem Relation Age of Onset   Prostate cancer Father    Hypertension Father    Brain cancer Daughter        neurofibromatosis, optic neurogliomas   Diabetes Maternal Uncle    Heart disease Maternal Grandmother        CBAG in late 35s   Hypertension Maternal Grandmother    Heart attack Maternal Grandfather 16   Arthritis Mother    Hypertension Mother    Alcohol abuse Brother        half brother, maternal   Mental illness Paternal Grandmother    Heart disease Paternal Grandfather    Heart attack Brother 76       half brother, maternal   Stroke Neg Hx    Breast cancer Neg Hx     Past Medical History:  Diagnosis Date   Alcoholism (HCC)    Ongoing (many years) of abuse until 05/17/2019 admission for detox.   Anxiety    Elevated transaminase level    likely secondary to alcohol.  Viral Hep screens normal.   Hypertension    IFG (impaired fasting glucose)    08/2022 fasting 109.  Hba1c 5.6%   Magnesium deficiency    Rotator cuff tendonitis, right 2021   recalcitrant ->MRI 12/2019--tiny low grade partial thickness bursal region teres minor tear   Thiamin deficiency    Tobacco dependence    quit 2024    Past Surgical History:  Procedure Laterality Date   BREAST BIOPSY Right 07/2018   benign (fibroadenoma)   CATARACT  EXTRACTION W/ INTRAOCULAR LENS  IMPLANT, BILATERAL  2008 and 2010   CERVICAL CONE BIOPSY     for HPV   MOUTH SURGERY  2022   WISDOM TOOTH EXTRACTION       Current Outpatient Medications on File Prior to Visit  Medication Sig Dispense Refill   buPROPion (WELLBUTRIN XL) 150 MG 24 hr tablet TAKE 1 TABLET BY MOUTH EVERY DAY IN THE MORNING. 90 tablet 1   carvedilol (COREG) 25 MG tablet Take 1 tablet (25 mg total) by mouth 2 (two) times daily. 180 tablet 1   Folic Acid 0.8 MG CAPS 1 cap po qd 90 capsule 3   irbesartan (AVAPRO) 150 MG tablet TAKE 1 TABLET BY MOUTH EVERY DAY 90 tablet 0   magnesium chloride (SLOW-MAG) 64 MG TBEC SR tablet Take 2 tablets (128 mg total) by mouth daily. 30 tablet 0   norethindrone (MICRONOR) 0.35 MG tablet Take 1 tablet (0.35 mg total) by mouth daily. 84 tablet 1   pantoprazole (PROTONIX) 40 MG tablet Take 1 tablet (40 mg total) by mouth daily. 90 tablet 1   propranolol (INDERAL) 10 MG tablet TAKE 1-2 TABS BY MOUTH TWICE DAILY AS NEEDED FOR ANXIETY 60 tablet 2   sertraline (ZOLOFT) 100 MG tablet TAKE 2 TABLETS BY MOUTH EVERY DAY 180 tablet 1   thiamine (VITAMIN B1) 100 MG tablet Take 1 tablet (100 mg total) by mouth daily. 90 tablet 1   hydrochlorothiazide (HYDRODIURIL) 25 MG tablet TAKE 1 TABLET BY MOUTH EVERY DAY IN THE MORNING (Patient not taking: Reported on 05/19/2023) 90 tablet 0   No current facility-administered medications on file prior to visit.    Allergies  Allergen Reactions   Lisinopril  Angioedema   Epinephrine Other (See Comments)    Tachycardia, dizziness   Sulfa Antibiotics      ? reaction     DIAGNOSTIC DATA (LABS, IMAGING, TESTING) - I reviewed patient records, labs, notes, testing and imaging myself where available.  Lab Results  Component Value Date   WBC 4.4 04/08/2023   HGB 12.0 04/08/2023   HCT 35.6 (L) 04/08/2023   MCV 103.0 (H) 04/08/2023   PLT 246.0 04/08/2023      Component Value Date/Time   NA 126 (L) 04/08/2023 1053    K 4.0 04/08/2023 1053   CL 89 (L) 04/08/2023 1053   CO2 25 04/08/2023 1053   GLUCOSE 93 04/08/2023 1053   BUN 10 04/08/2023 1053   CREATININE 0.60 04/08/2023 1053   CALCIUM 9.8 04/08/2023 1053   PROT 7.8 04/08/2023 1053   ALBUMIN 4.6 04/08/2023 1053   AST 109 (H) 04/08/2023 1053   ALT 76 (H) 04/08/2023 1053   ALKPHOS 56 04/08/2023 1053   BILITOT 0.6 04/08/2023 1053   GFRNONAA >60 09/23/2020 1253   GFRAA >60 05/20/2019 0351   Lab Results  Component Value Date   CHOL 174 09/13/2022   HDL 73.10 09/13/2022   LDLCALC 87 09/13/2022   TRIG 72.0 09/13/2022   CHOLHDL 2 09/13/2022   Lab Results  Component Value Date   HGBA1C 5.6 09/16/2022   Lab Results  Component Value Date   VITAMINB12 401 03/17/2023   Lab Results  Component Value Date   TSH 2.58 03/17/2023    PHYSICAL EXAM:  Today's Vitals   05/19/23 1254  BP: (!) 140/94  Pulse: 90  Weight: 165 lb (74.8 kg)  Height: 5' 4.5" (1.638 m)   Body mass index is 27.88 kg/m.   Wt Readings from Last 3 Encounters:  05/19/23 165 lb (74.8 kg)  04/08/23 164 lb 3.2 oz (74.5 kg)  03/17/23 163 lb (73.9 kg)     Ht Readings from Last 3 Encounters:  05/19/23 5' 4.5" (1.638 m)  03/17/23 5' 5.75" (1.67 m)  11/18/22 5' 5.75" (1.67 m)      General: The patient is awake, alert and appears not in acute distress. The patient is well groomed. Head: Normocephalic, atraumatic. Neck is supple. Mallampati 3,  neck circumference:15.5 inches .  Nasal airflow not patent. Deviated septum and nasal speech  Retrognathia is not seen.  Dental status:  Cardiovascular:  Regular rate and cardiac rhythm by pulse,  without distended neck veins. Respiratory: Lungs are clear to auscultation.  Skin:  Without evidence of ankle edema, or rash. Trunk: The patient's posture is erect.   NEUROLOGIC EXAM: The patient is awake and alert, oriented to place and time.   Memory subjective described as intact.  Attention span & concentration ability appears  normal.  Speech is fluent,  without dysarthria, dysphonia or aphasia.  Mood and affect are hypervigilant and anxious.    Cranial nerves: no loss of smell or taste reported  Pupils are equal and briskly reactive to light. Funduscopic exam deferred. .  Extraocular movements in vertical and horizontal planes were intact and without nystagmus. No Diplopia. Visual fields by finger perimetry are intact. Hearing was intact to soft voice and finger rubbing.    Facial sensation intact to fine touch.  Facial motor strength is symmetric and tongue was trembling, mild tremor in both hands, jittery-  and uvula move midline.  Neck ROM : rotation, tilt and flexion extension were normal for age and shoulder shrug was symmetrical.  Motor exam:  Symmetric bulk, tone and ROM.   Normal tone without cog-wheeling, symmetric grip strength .   Sensory:  Fine touch and vibration were intact at ankle level, but the patient reported numbness in the toes and bottom.  Proprioception tested in the upper extremities was normal.   Coordination: Rapid alternating movements in the fingers/hands were of normal speed.  The Finger-to-nose maneuver was intact without evidence of ataxia, dysmetria but there is  tremor.   Gait and station: Patient could rise unassisted from a seated position, walked without assistive device.  Stance is of normal width/ base and the patient turned with 3 steps.  Toe and heel walk were deferred.  Deep tendon reflexes: in the  upper and lower extremities are symmetric and VERY BRISK- hyperreflexia.  Babinski response was deferred    ASSESSMENT AND Myers 47 y.o. year old female  here with:    1) excessive fatigue , anxiety, shame - this is related to lack of exercise and hiding her alcohol abuse problem. She  needs help to address the addiction. I recommended more complex vitamins to prevent  some long term damage to brain and PNS, but the key will be to achieve sobriety.   2) Neuropathy is  likely a transient effect of her alcohol abuse and vitamin deficiency. She has cramping too, more often associated with lack of electrolytes.   3) Hyperreflexia and tremor are subtle signs of withdrawal. Trazodone ordered, refill at 50 mg at PM. Can take 2 if needed.     I will order Xanax 0. Mg 12 tabs to help her get over the next 12 nights and return to AA.  I also will order a HST for later this summer to screen for apnea.   I Myers to follow up either personally or through our NP within 3 months IF THE HST IS POSITIVE FOR AN ORGANIC SLEEP DISORDER.   I would like to thank McGowen, Maryjean Morn, MD and Michelle Massed, Md [address],  [place] [address] for allowing me to meet with and to take care of this pleasant patient in our SLEEP CLINIC .    After spending a total time of  40  minutes face to face and additional time for physical and neurologic examination, review of laboratory studies,  personal review of imaging studies, reports and results of other testing and review of referral information / records as far as provided in visit,   Electronically signed by: Michelle Novas, MD 05/19/2023 1:10 PM  [facility] Sleep Board certified by The [employer] of Sleep Medicine and Diplomate of the [employer] of Sleep Medicine. Board certified In Neurology through the ABPN, Fellow of the [employer] of Neu[employer].

## 2023-05-23 ENCOUNTER — Telehealth (HOSPITAL_BASED_OUTPATIENT_CLINIC_OR_DEPARTMENT_OTHER): Payer: Self-pay

## 2023-05-28 ENCOUNTER — Telehealth (HOSPITAL_BASED_OUTPATIENT_CLINIC_OR_DEPARTMENT_OTHER): Payer: Self-pay

## 2023-06-05 ENCOUNTER — Telehealth: Payer: Self-pay | Admitting: Neurology

## 2023-06-05 NOTE — Telephone Encounter (Signed)
05/27/23: LVM ag  05/26/23 [place] Numbers: [id number] (exp. 05/26/23 to 07/24/23) EE

## 2023-06-07 ENCOUNTER — Other Ambulatory Visit: Payer: Self-pay | Admitting: Family Medicine

## 2023-06-11 ENCOUNTER — Other Ambulatory Visit: Payer: Self-pay | Admitting: Family Medicine

## 2023-07-05 ENCOUNTER — Other Ambulatory Visit: Payer: Self-pay | Admitting: Family Medicine

## 2023-07-11 ENCOUNTER — Other Ambulatory Visit: Payer: Self-pay | Admitting: Neurology

## 2023-08-01 ENCOUNTER — Other Ambulatory Visit: Payer: Self-pay | Admitting: Family Medicine

## 2023-08-03 ENCOUNTER — Other Ambulatory Visit: Payer: Self-pay | Admitting: Family Medicine

## 2023-08-10 ENCOUNTER — Other Ambulatory Visit: Payer: Self-pay | Admitting: Family Medicine

## 2023-08-12 ENCOUNTER — Other Ambulatory Visit: Payer: Self-pay | Admitting: Family Medicine

## 2023-08-28 ENCOUNTER — Other Ambulatory Visit: Payer: Self-pay | Admitting: Family Medicine

## 2023-09-02 NOTE — Progress Notes (Unsigned)
OFFICE VISIT  09/03/2023  CC:  Chief Complaint  Patient presents with   Medical Management of Chronic Issues    Pt mentions discussing neuropathy in her feet; pt states it has gotten worse since the last time it was discussed. Pt also mentions she is experiencing recurring styes.      Patient is a 47 y.o. female who presents for peripheral neuropathy symptoms as well as f/u HTN, chronic depression and anxiety in the setting of chronic alc abuse, elev transaminases. I last saw her 04/08/23.   A/P as of that visit: "#1 malnutrition secondary to alcoholism. Encouraged alcohol cessation.     #2 thiamine and magnesium deficiency. Secondary to alcoholism. Continue thiamine 100 mg a day and magnesium 64 mg, 2 tabs daily. Start folic acid.   Recheck thiamine and magnesium levels today.   #3 peripheral neuropathy (sensory). Suspect due to thiamine deficiency, +/- alcoholic neuropathy. Continue thiamine deficiency and we will give this time.   #4 chronic fatigue/excessive daytime sleepiness.  Progressive. Monitor complete blood count and iron today. Will refer to sleep medicine for consideration of sleep study.   5.  Hypertension, great blood pressure today. Continue Coreg 25 mg twice a day, HCTZ 25 mg a day, and irbesartan 150 mg daily.   #6 alcoholism, ongoing alcohol abuse. She has GAD and major depressive disorder. She will continue Wellbutrin XL 150 mg daily and 200 mg of sertraline daily. She is not contemplating cessation of alcohol at this time but does want to start counseling. Encouraged AA attendance as well. Counseling referral ordered today.   #7 elevated transaminase. Consistent with alcohol abuse. Viral hep serologies negative. Discussed the option of getting right upper quadrant abdominal ultrasound but she declined at this time. Repeating hepatic panel today."  INTERIM HX: She saw Dr. Vickey Huger 06/05/23, neuropathy suspected to be d/t alc use. Home sleep study was  planned/ordered but not scheduled. A new vitamin pill was recommended.  UPDATE: Feeling constant numbness on the balls of her feet and toes bilaterally, gradually worsening.  No symptoms in her fingers .  no pain. She did not pick up the vitamin pill prescribed by the neurologist. She has been only intermittently compliant with most of her medications.  She says she is consistently taking her carvedilol and irbesartan and pantoprazole. Describes chronic severe anxiety.  Sometimes it gets her to feeling nauseous and she vomits. She also says her baseline bowel habits are loose BMs multiple times a day. She says she does not hydrate very well.  She still drinks 2 bottles of wine every day.  She is not considering quitting at this time. Says urine is concentrated.  Denies dysuria, hematuria, or urinary urgency/frequency.   Of note, she did schedule her right upper quadrant ultrasound for tomorrow.  She has had redness around the lash lines of upper and lower eyelids the last few weeks.  No eye drainage.  No redness of the eyeball.  No pain but there is some discomfort/itching.  ROS as above, plus--> no fevers, no CP, no SOB, no wheezing, no cough, no dizziness, no HAs, no rashes, no melena/hematochezia.  No polyuria or polydipsia.  No myalgias or arthralgias.  No focal weakness, paresthesias, or tremors.  No acute vision or hearing abnormalities.   No recent changes in lower legs.     Past Medical History:  Diagnosis Date   Alcoholism (HCC)    Ongoing (many years) of abuse until 05/17/2019 admission for detox.   Anxiety  Elevated transaminase level    likely secondary to alcohol.  Viral Hep screens normal.   Hypertension    IFG (impaired fasting glucose)    08/2022 fasting 109.  Hba1c 5.6%   Magnesium deficiency    Rotator cuff tendonitis, right 2021   recalcitrant ->MRI 12/2019--tiny low grade partial thickness bursal region teres minor tear   Thiamin deficiency    Tobacco dependence     quit 2024    Past Surgical History:  Procedure Laterality Date   BREAST BIOPSY Right 07/2018   benign (fibroadenoma)   CATARACT EXTRACTION W/ INTRAOCULAR LENS  IMPLANT, BILATERAL  2008 and 2010   CERVICAL CONE BIOPSY     for HPV   MOUTH SURGERY  2022   WISDOM TOOTH EXTRACTION      Outpatient Medications Prior to Visit  Medication Sig Dispense Refill   buPROPion (WELLBUTRIN XL) 150 MG 24 hr tablet TAKE 1 TABLET BY MOUTH EVERY DAY IN THE MORNING 90 tablet 1   Folic Acid 0.8 MG CAPS 1 cap po qd 90 capsule 3   norethindrone (MICRONOR) 0.35 MG tablet Take 1 tablet (0.35 mg total) by mouth daily. 84 tablet 1   traZODone (DESYREL) 50 MG tablet TAKE 1 TABLET BY MOUTH EVERYDAY AT BEDTIME 90 tablet 0   carvedilol (COREG) 25 MG tablet TAKE 1 TABLET BY MOUTH TWICE A DAY 30 tablet 0   ALPRAZolam (XANAX) 0.5 MG tablet Take 1 tablet (0.5 mg total) by mouth 2 (two) times daily as needed for anxiety. (Patient not taking: Reported on 09/03/2023) 24 tablet 0   hydrochlorothiazide (HYDRODIURIL) 25 MG tablet TAKE 1 TABLET BY MOUTH EVERY DAY IN THE MORNING (Patient not taking: Reported on 05/19/2023) 90 tablet 0   irbesartan (AVAPRO) 150 MG tablet TAKE 1 TABLET BY MOUTH EVERY DAY 30 tablet 0   magnesium chloride (SLOW-MAG) 64 MG TBEC SR tablet Take 2 tablets (128 mg total) by mouth daily. 30 tablet 0   pantoprazole (PROTONIX) 40 MG tablet TAKE 1 TABLET BY MOUTH EVERY DAY 30 tablet 1   Prenatal Vit-Fe Fumarate-FA (PRENATAL VITAMIN AND MINERAL) 28-0.8 MG TABS 2 a day po (Patient not taking: Reported on 09/03/2023)     propranolol (INDERAL) 10 MG tablet TAKE 1-2 TABS BY MOUTH TWICE DAILY AS NEEDED FOR ANXIETY (Patient not taking: Reported on 09/03/2023) 60 tablet 2   sertraline (ZOLOFT) 100 MG tablet TAKE 2 TABLETS BY MOUTH EVERY DAY 180 tablet 1   thiamine (VITAMIN B1) 100 MG tablet Take 1 tablet (100 mg total) by mouth daily. (Patient not taking: Reported on 09/03/2023) 90 tablet 1   No  facility-administered medications prior to visit.    Allergies  Allergen Reactions   Lisinopril     Angioedema   Epinephrine Other (See Comments)    Tachycardia, dizziness   Sulfa Antibiotics      ? reaction    Review of Systems As per HPI  PE:    09/03/2023    8:30 AM 05/19/2023   12:54 PM 04/08/2023   10:16 AM  Vitals with BMI  Height  5' 4.5"   Weight 156 lbs 165 lbs 164 lbs 3 oz  BMI  27.9 26.71  Systolic 117 140 161  Diastolic 87 94 81  Pulse 97 90 89     Physical Exam  Gen: Alert, tired-appearing but does not appear acutely ill.  Patient is oriented to person, place, time, and situation. Affect: Anxious, tearful at times.  Pleasant.  Lucid thought and speech.  Both eyelids with mild swelling and erythema around the lash lines.  A couple of very small styes on each side.  No bulbar injection.  Very mild diffuse palpebral conjunctival injection. No further exam today  LABS:  Last CBC Lab Results  Component Value Date   WBC 4.4 04/08/2023   HGB 12.0 04/08/2023   HCT 35.6 (L) 04/08/2023   MCV 103.0 (H) 04/08/2023   MCH 33.8 09/23/2020   RDW 13.4 04/08/2023   PLT 246.0 04/08/2023   Last metabolic panel Lab Results  Component Value Date   GLUCOSE 93 04/08/2023   NA 126 (L) 04/08/2023   K 4.0 04/08/2023   CL 89 (L) 04/08/2023   CO2 25 04/08/2023   BUN 10 04/08/2023   CREATININE 0.60 04/08/2023   GFR 107.14 04/08/2023   CALCIUM 9.8 04/08/2023   PROT 7.8 04/08/2023   ALBUMIN 4.6 04/08/2023   BILITOT 0.6 04/08/2023   ALKPHOS 56 04/08/2023   AST 109 (H) 04/08/2023   ALT 76 (H) 04/08/2023   ANIONGAP 12 09/23/2020   Last vitamin B12 and Folate Lab Results  Component Value Date   VITAMINB12 401 03/17/2023   FOLATE 3.8 (L) 03/13/2022   IMPRESSION AND PLAN:  #1 hypertension, well-controlled on irbesartan 150 mg a day and Coreg 25 mg twice a day.  She is no longer on HCTZ. Check electrolytes and creatinine today.  2.  Chronic alcoholic  hepatitis. Monitor hepatic panel today. Right upper quadrant abdominal ultrasound scheduled for tomorrow. Once again, encouraged complete alcohol cessation but she is not ready at this time. Continue thiamine--refilled today.  Also encouraged her to start the vitamin recommended by the neurologist--> prenatal vitamin with ferrous fumarate and folic acid.  3.  Peripheral neuropathy in both feet. Highly suspect this is due to alcohol abuse. Continue/restart thiamine 100 mg a day.  #4 hypomagnesemia. Refilled magnesium, not sure she has been taking much of this lately. Recheck magnesium level today.  #5 multiple hordeolum bilaterally.  Also I suspect a lot of this is inflammation due to excessive crying. Erythromycin ointment 3 times daily to each eye prescribed.  6.  GAD, recurrent major depressive disorder. Anxiety seems to be the worst of this lately. Unlikely to change until she quits drinking and is in sustained recovery. Continue Wellbutrin XL 150 mg a day and sertraline 200 mg a day.  An After Visit Summary was printed and given to the patient.  FOLLOW UP: Return in about 3 months (around 12/04/2023) for routine chronic illness f/u.  Signed:  Santiago Bumpers, MD           09/03/2023

## 2023-09-03 ENCOUNTER — Ambulatory Visit: Payer: BC Managed Care – PPO | Admitting: Family Medicine

## 2023-09-03 ENCOUNTER — Encounter: Payer: Self-pay | Admitting: Family Medicine

## 2023-09-03 VITALS — BP 117/87 | HR 97 | Wt 156.0 lb

## 2023-09-03 DIAGNOSIS — E1142 Type 2 diabetes mellitus with diabetic polyneuropathy: Secondary | ICD-10-CM

## 2023-09-03 DIAGNOSIS — I10 Essential (primary) hypertension: Secondary | ICD-10-CM | POA: Diagnosis not present

## 2023-09-03 DIAGNOSIS — G621 Alcoholic polyneuropathy: Secondary | ICD-10-CM

## 2023-09-03 DIAGNOSIS — R7401 Elevation of levels of liver transaminase levels: Secondary | ICD-10-CM | POA: Diagnosis not present

## 2023-09-03 DIAGNOSIS — K7010 Alcoholic hepatitis without ascites: Secondary | ICD-10-CM | POA: Diagnosis not present

## 2023-09-03 DIAGNOSIS — E871 Hypo-osmolality and hyponatremia: Secondary | ICD-10-CM

## 2023-09-03 DIAGNOSIS — F1020 Alcohol dependence, uncomplicated: Secondary | ICD-10-CM

## 2023-09-03 LAB — COMPREHENSIVE METABOLIC PANEL
ALT: 82 U/L — ABNORMAL HIGH (ref 0–35)
AST: 197 U/L — ABNORMAL HIGH (ref 0–37)
Albumin: 4.4 g/dL (ref 3.5–5.2)
Alkaline Phosphatase: 71 U/L (ref 39–117)
BUN: 7 mg/dL (ref 6–23)
CO2: 25 meq/L (ref 19–32)
Calcium: 9.4 mg/dL (ref 8.4–10.5)
Chloride: 99 meq/L (ref 96–112)
Creatinine, Ser: 0.61 mg/dL (ref 0.40–1.20)
GFR: 106.41 mL/min (ref >60.00)
Glucose, Bld: 98 mg/dL (ref 70–99)
Potassium: 4.4 meq/L (ref 3.5–5.1)
Sodium: 134 meq/L — ABNORMAL LOW (ref 135–145)
Total Bilirubin: 0.7 mg/dL (ref 0.2–1.2)
Total Protein: 7.2 g/dL (ref 6.0–8.3)

## 2023-09-03 LAB — MAGNESIUM: Magnesium: 1.4 mg/dL — ABNORMAL LOW (ref 1.5–2.5)

## 2023-09-03 MED ORDER — IRBESARTAN 150 MG PO TABS: 150.0000 mg | ORAL_TABLET | Freq: Every day | ORAL | 1 refills | Status: DC

## 2023-09-03 MED ORDER — PRENATAL VITAMIN AND MINERAL 28-0.8 MG PO TABS: ORAL_TABLET | ORAL | Status: DC

## 2023-09-03 MED ORDER — CARVEDILOL 25 MG PO TABS: 25.0000 mg | ORAL_TABLET | Freq: Two times a day (BID) | ORAL | 1 refills | Status: DC

## 2023-09-03 MED ORDER — PANTOPRAZOLE SODIUM 40 MG PO TBEC: 40.0000 mg | DELAYED_RELEASE_TABLET | Freq: Every day | ORAL | 1 refills | Status: DC

## 2023-09-03 MED ORDER — THIAMINE HCL 100 MG PO TABS: 100.0000 mg | ORAL_TABLET | Freq: Every day | ORAL | 1 refills | Status: DC

## 2023-09-03 MED ORDER — PROPRANOLOL HCL 10 MG PO TABS: ORAL_TABLET | ORAL | 2 refills | Status: DC

## 2023-09-03 MED ORDER — MAGNESIUM CHLORIDE 64 MG PO TBEC: 2.0000 | DELAYED_RELEASE_TABLET | Freq: Every day | ORAL | 1 refills | Status: DC

## 2023-09-03 MED ORDER — SERTRALINE HCL 100 MG PO TABS: 200.0000 mg | ORAL_TABLET | Freq: Every day | ORAL | 1 refills | Status: AC

## 2023-09-03 MED ORDER — ERYTHROMYCIN 5 MG/GM OP OINT: 1.0000 | TOPICAL_OINTMENT | Freq: Three times a day (TID) | OPHTHALMIC | 0 refills | Status: DC

## 2023-09-04 ENCOUNTER — Ambulatory Visit (HOSPITAL_BASED_OUTPATIENT_CLINIC_OR_DEPARTMENT_OTHER): Payer: BC Managed Care – PPO

## 2023-09-09 LAB — VITAMIN B1: Vitamin B1 (Thiamine): <6 nmol/L — ABNORMAL LOW (ref 8–30)

## 2023-09-15 ENCOUNTER — Encounter: Payer: BC Managed Care – PPO | Admitting: Family Medicine

## 2023-09-20 ENCOUNTER — Other Ambulatory Visit: Payer: Self-pay | Admitting: Family Medicine

## 2023-09-26 ENCOUNTER — Encounter: Payer: Self-pay | Admitting: Family Medicine

## 2023-09-26 ENCOUNTER — Ambulatory Visit: Payer: BC Managed Care – PPO | Admitting: Family Medicine

## 2023-09-26 VITALS — BP 132/94 | HR 96 | Wt 154.8 lb

## 2023-09-26 DIAGNOSIS — R21 Rash and other nonspecific skin eruption: Secondary | ICD-10-CM

## 2023-09-26 DIAGNOSIS — H00015 Hordeolum externum left lower eyelid: Secondary | ICD-10-CM | POA: Diagnosis not present

## 2023-09-26 MED ORDER — ERYTHROMYCIN 5 MG/GM OP OINT: 1.0000 | TOPICAL_OINTMENT | Freq: Three times a day (TID) | OPHTHALMIC | 1 refills | Status: DC

## 2023-09-26 MED ORDER — PREDNISONE 10 MG PO TABS: ORAL_TABLET | ORAL | 0 refills | Status: DC

## 2023-09-26 NOTE — Patient Instructions (Signed)
Buy over-the-counter Allegra 180 mg and take 1 daily for itching.

## 2023-09-26 NOTE — Progress Notes (Signed)
OFFICE VISIT  09/26/2023  CC:  Chief Complaint  Patient presents with   Rash    Itching/irritation started a few weeks ago, has gotten worse. Rash started form all over back and now around chest and stomach. Pt also mentions, on and off, having brown colored small shaped "particles" in urine.     Patient is a 47 y.o. female who presents for rash and urinary concern.  HPI: About 3 weeks ago she started to get an itchy bumpy rash on her back.  It has spread over most of the back and a little bit on the upper chest and abdomen.  Is very itchy.  No recent new contact allergens or new medications.  Her daughter often sleeps with her and her husband and neither of them have any rash.  Her cat had fleas when examined by the vet a few weeks ago but she has never seen any fleas in her home.  She treated the cat as well as did some home treatment.  She has no rash on her hands or feet or face.  Of note, the styes and eye irritation have all resolved except for 1 on her left lower lid.  Past Medical History:  Diagnosis Date   Alcoholic peripheral neuropathy (HCC)    Alcoholism (HCC)    Ongoing (many years) of abuse until 05/17/2019 admission for detox.   Anxiety    Elevated transaminase level    likely secondary to alcohol.  Viral Hep screens normal.   Hypertension    IFG (impaired fasting glucose)    08/2022 fasting 109.  Hba1c 5.6%   Magnesium deficiency    Rotator cuff tendonitis, right 2021   recalcitrant ->MRI 12/2019--tiny low grade partial thickness bursal region teres minor tear   Thiamin deficiency    Tobacco dependence    quit 2024    Past Surgical History:  Procedure Laterality Date   BREAST BIOPSY Right 07/2018   benign (fibroadenoma)   CATARACT EXTRACTION W/ INTRAOCULAR LENS  IMPLANT, BILATERAL  2008 and 2010   CERVICAL CONE BIOPSY     for HPV   MOUTH SURGERY  2022   WISDOM TOOTH EXTRACTION      Outpatient Medications Prior to Visit  Medication Sig Dispense Refill    buPROPion (WELLBUTRIN XL) 150 MG 24 hr tablet TAKE 1 TABLET BY MOUTH EVERY DAY IN THE MORNING 90 tablet 1   carvedilol (COREG) 25 MG tablet Take 1 tablet (25 mg total) by mouth 2 (two) times daily. 90 tablet 1   erythromycin ophthalmic ointment Place 1 Application into both eyes 3 (three) times daily. 7 g 0   Folic Acid 0.8 MG CAPS 1 cap po qd 90 capsule 3   irbesartan (AVAPRO) 150 MG tablet Take 1 tablet (150 mg total) by mouth daily. 90 tablet 1   magnesium chloride (SLOW-MAG) 64 MG TBEC SR tablet Take 2 tablets (128 mg total) by mouth daily. 180 tablet 1   norethindrone (MICRONOR) 0.35 MG tablet Take 1 tablet (0.35 mg total) by mouth daily. 84 tablet 1   pantoprazole (PROTONIX) 40 MG tablet Take 1 tablet (40 mg total) by mouth daily. 90 tablet 1   Prenatal Vit-Fe Fumarate-FA (PRENATAL VITAMIN AND MINERAL) 28-0.8 MG TABS 2 a day po     propranolol (INDERAL) 10 MG tablet TAKE 1-2 TABS BY MOUTH TWICE DAILY AS NEEDED FOR ANXIETY 60 tablet 2   sertraline (ZOLOFT) 100 MG tablet Take 2 tablets (200 mg total) by mouth daily. 180 tablet  1   thiamine (VITAMIN B1) 100 MG tablet Take 1 tablet (100 mg total) by mouth daily. 90 tablet 1   traZODone (DESYREL) 50 MG tablet TAKE 1 TABLET BY MOUTH EVERYDAY AT BEDTIME 90 tablet 0   No facility-administered medications prior to visit.    Allergies  Allergen Reactions   Lisinopril     Angioedema   Epinephrine Other (See Comments)    Tachycardia, dizziness   Sulfa Antibiotics      ? reaction    Review of Systems  As per HPI  PE:    09/26/2023    8:04 AM 09/03/2023    8:30 AM 05/19/2023   12:54 PM  Vitals with BMI  Height   5' 4.5"  Weight 154 lbs 13 oz 156 lbs 165 lbs  BMI   27.9  Systolic 132 117 952  Diastolic 94 87 94  Pulse 96 97 90    Exam chaperoned by Cloe Motsinger, CMA  Physical Exam  General: Alert, she is not acutely ill-appearing. There is a diffuse fine, pink papular rash spread over her back and lower abdomen as well as a  few lesions on her chest.  No vesicles.  No pustules, no hives, no petechiae.  No erythema. Left lower eyelid with pink papule at that lash line.  Otherwise eyes without erythema or swelling or drainage. LABS:  Last CBC Lab Results  Component Value Date   WBC 4.4 04/08/2023   HGB 12.0 04/08/2023   HCT 35.6 (L) 04/08/2023   MCV 103.0 (H) 04/08/2023   MCH 33.8 09/23/2020   RDW 13.4 04/08/2023   PLT 246.0 04/08/2023   Lab Results  Component Value Date   VITAMINB12 401 03/17/2023   Last metabolic panel Lab Results  Component Value Date   GLUCOSE 98 09/03/2023   NA 134 (L) 09/03/2023   K 4.4 09/03/2023   CL 99 09/03/2023   CO2 25 09/03/2023   BUN 7 09/03/2023   CREATININE 0.61 09/03/2023   GFR 106.41 09/03/2023   CALCIUM 9.4 09/03/2023   PROT 7.2 09/03/2023   ALBUMIN 4.4 09/03/2023   BILITOT 0.7 09/03/2023   ALKPHOS 71 09/03/2023   AST 197 (H) 09/03/2023   ALT 82 (H) 09/03/2023   ANIONGAP 12 09/23/2020   Last hemoglobin A1c Lab Results  Component Value Date   HGBA1C 5.6 09/16/2022   IMPRESSION AND PLAN:  #1 pruritic pinkish papular rash.  Unknown etiology. Prednisone taper--> 40 daily x 3 days, 30 to daily x 3 days, 20 daily x 3 days, then 10 daily x 3 days. Allegra 180 mg daily as needed itching.  2.  Left lower eyelid hordeolum. New erythromycin ointment prescription today. She has not been doing any baby shampoo soaks so we discussed this again today.  An After Visit Summary was printed and given to the patient.  FOLLOW UP: No follow-ups on file.  Signed:  Santiago Bumpers, MD           09/26/2023

## 2023-10-07 ENCOUNTER — Other Ambulatory Visit: Payer: Self-pay | Admitting: Neurology

## 2023-11-05 ENCOUNTER — Telehealth: Payer: Self-pay

## 2023-11-05 NOTE — Telephone Encounter (Signed)
Spoke with pt regarding imaging.  Pt agreed to move further with imaging as of now. I advised her that the order had already been previously placed and of the imaging location. Pt understood and had no further questions. She will notify us if she has any issues.

## 2023-11-05 NOTE — Telephone Encounter (Signed)
noted 

## 2023-11-05 NOTE — Telephone Encounter (Signed)
LVM to discuss

## 2023-11-05 NOTE — Telephone Encounter (Signed)
Copied and pasted CRM  Reason for CRM: Patient states Dr. Milinda Cave suggested patient to get an ultra sound at her appointment last May but patient had declined. Patient states she now knows she must have one due to the pain she is experiencing and is requesting Dr. Milinda Cave to please place that order/referral for the ultra sound and update her when this can be completed   Is [place] order still active even though she declined?  Please call patient [phone]

## 2023-11-05 NOTE — Telephone Encounter (Signed)
Reason for AVW:[id number] is returning a call she received from Diedra Sinor A. Attempted to call [place] but no answer. Please return patient's call.    Returned pt call.

## 2023-12-04 NOTE — Patient Instructions (Incomplete)

## 2023-12-05 ENCOUNTER — Ambulatory Visit: Payer: BC Managed Care – PPO | Admitting: Family Medicine

## 2023-12-05 NOTE — Progress Notes (Deleted)
 OFFICE VISIT  12/05/2023  CC: No chief complaint on file.   Patient is a 48 y.o. female who presents for 32-month follow-up chronic alcoholic hepatitis, anxiety and depression, and hypertension. A/P as of last visit: "#1 hypertension, well-controlled on irbesartan 150 mg a day and Coreg 25 mg twice a day.  She is no longer on HCTZ. Check electrolytes and creatinine today.   2.  Chronic alcoholic hepatitis. Monitor hepatic panel today. Right upper quadrant abdominal ultrasound scheduled for tomorrow. Once again, encouraged complete alcohol cessation but she is not ready at this time. Continue thiamine--refilled today.  Also encouraged her to start the vitamin recommended by the neurologist--> prenatal vitamin with ferrous fumarate and folic acid.   3.  Peripheral neuropathy in both feet. Highly suspect this is due to alcohol abuse. Continue/restart thiamine 100 mg a day.   #4 hypomagnesemia. Refilled magnesium, not sure she has been taking much of this lately. Recheck magnesium level today.   #5 multiple hordeolum bilaterally.  Also I suspect a lot of this is inflammation due to excessive crying. Erythromycin ointment 3 times daily to each eye prescribed.   6.  GAD, recurrent major depressive disorder. Anxiety seems to be the worst of this lately. Unlikely to change until she quits drinking and is in sustained recovery. Continue Wellbutrin XL 150 mg a day and sertraline 200 mg a day."  INTERIM HX: ***   Past Medical History:  Diagnosis Date   Alcoholic peripheral neuropathy (HCC)    Alcoholism (HCC)    Ongoing (many years) of abuse until 05/17/2019 admission for detox.   Anxiety    Elevated transaminase level    likely secondary to alcohol.  Viral Hep screens normal.   Hypertension    IFG (impaired fasting glucose)    08/2022 fasting 109.  Hba1c 5.6%   Magnesium deficiency    Rotator cuff tendonitis, right 2021   recalcitrant ->MRI 12/2019--tiny low grade partial  thickness bursal region teres minor tear   Thiamin deficiency    Tobacco dependence    quit 2024    Past Surgical History:  Procedure Laterality Date   BREAST BIOPSY Right 07/2018   benign (fibroadenoma)   CATARACT EXTRACTION W/ INTRAOCULAR LENS  IMPLANT, BILATERAL  2008 and 2010   CERVICAL CONE BIOPSY     for HPV   MOUTH SURGERY  2022   WISDOM TOOTH EXTRACTION      Outpatient Medications Prior to Visit  Medication Sig Dispense Refill   buPROPion (WELLBUTRIN XL) 150 MG 24 hr tablet TAKE 1 TABLET BY MOUTH EVERY DAY IN THE MORNING 90 tablet 1   carvedilol (COREG) 25 MG tablet Take 1 tablet (25 mg total) by mouth 2 (two) times daily. 90 tablet 1   erythromycin ophthalmic ointment Place 1 Application into the left eye 3 (three) times daily. 7 g 1   Folic Acid 0.8 MG CAPS 1 cap po qd 90 capsule 3   irbesartan (AVAPRO) 150 MG tablet Take 1 tablet (150 mg total) by mouth daily. 90 tablet 1   magnesium chloride (SLOW-MAG) 64 MG TBEC SR tablet Take 2 tablets (128 mg total) by mouth daily. 180 tablet 1   norethindrone (MICRONOR) 0.35 MG tablet Take 1 tablet (0.35 mg total) by mouth daily. 84 tablet 1   pantoprazole (PROTONIX) 40 MG tablet Take 1 tablet (40 mg total) by mouth daily. 90 tablet 1   Prenatal Vit-Fe Fumarate-FA (PRENATAL VITAMIN AND MINERAL) 28-0.8 MG TABS 2 a day po  propranolol (INDERAL) 10 MG tablet TAKE 1-2 TABS BY MOUTH TWICE DAILY AS NEEDED FOR ANXIETY 60 tablet 2   sertraline (ZOLOFT) 100 MG tablet Take 2 tablets (200 mg total) by mouth daily. 180 tablet 1   thiamine (VITAMIN B1) 100 MG tablet Take 1 tablet (100 mg total) by mouth daily. 90 tablet 1   traZODone (DESYREL) 50 MG tablet TAKE 1 TABLET BY MOUTH EVERYDAY AT BEDTIME 90 tablet 0   predniSONE (DELTASONE) 10 MG tablet 4 daily x 3 days, then 3 daily x 3 days, then 2 daily x 3 days, then 1 daily x 3 days 30 tablet 0   No facility-administered medications prior to visit.    Allergies  Allergen Reactions    Lisinopril     Angioedema   Epinephrine Other (See Comments)    Tachycardia, dizziness   Sulfa Antibiotics      ? reaction    Review of Systems As per HPI  PE:    09/26/2023    8:04 AM 09/03/2023    8:30 AM 05/19/2023   12:54 PM  Vitals with BMI  Height   5' 4.5"  Weight 154 lbs 13 oz 156 lbs 165 lbs  BMI   27.9  Systolic 132 117 401  Diastolic 94 87 94  Pulse 96 97 90     Physical Exam  ***  LABS:  Last CBC Lab Results  Component Value Date   WBC 4.4 04/08/2023   HGB 12.0 04/08/2023   HCT 35.6 (L) 04/08/2023   MCV 103.0 (H) 04/08/2023   MCH 33.8 09/23/2020   RDW 13.4 04/08/2023   PLT 246.0 04/08/2023   Last metabolic panel Lab Results  Component Value Date   GLUCOSE 98 09/03/2023   NA 134 (L) 09/03/2023   K 4.4 09/03/2023   CL 99 09/03/2023   CO2 25 09/03/2023   BUN 7 09/03/2023   CREATININE 0.61 09/03/2023   GFR 106.41 09/03/2023   CALCIUM 9.4 09/03/2023   PROT 7.2 09/03/2023   ALBUMIN 4.4 09/03/2023   BILITOT 0.7 09/03/2023   ALKPHOS 71 09/03/2023   AST 197 (H) 09/03/2023   ALT 82 (H) 09/03/2023   ANIONGAP 12 09/23/2020   Last lipids Lab Results  Component Value Date   CHOL 174 09/13/2022   HDL 73.10 09/13/2022   LDLCALC 87 09/13/2022   TRIG 72.0 09/13/2022   CHOLHDL 2 09/13/2022   Last hemoglobin A1c Lab Results  Component Value Date   HGBA1C 5.6 09/16/2022   Last thyroid functions Lab Results  Component Value Date   TSH 2.58 03/17/2023   T3TOTAL 135 07/29/2019   Last vitamin B12 and Folate Lab Results  Component Value Date   VITAMINB12 401 03/17/2023   FOLATE 3.8 (L) 03/13/2022   IMPRESSION AND PLAN:  No problem-specific Assessment & Plan notes found for this encounter.   An After Visit Summary was printed and given to the patient.  FOLLOW UP: No follow-ups on file.  Signed:  Santiago Bumpers, MD           12/05/2023

## 2023-12-06 ENCOUNTER — Other Ambulatory Visit: Payer: Self-pay | Admitting: Family Medicine

## 2023-12-08 ENCOUNTER — Other Ambulatory Visit: Payer: Self-pay | Admitting: Family Medicine

## 2023-12-23 ENCOUNTER — Ambulatory Visit: Payer: BC Managed Care – PPO | Admitting: Family Medicine

## 2023-12-24 ENCOUNTER — Other Ambulatory Visit: Payer: Self-pay | Admitting: Family Medicine

## 2024-01-08 ENCOUNTER — Encounter: Admitting: Family Medicine

## 2024-01-09 ENCOUNTER — Encounter: Payer: Self-pay | Admitting: Urgent Care

## 2024-01-09 ENCOUNTER — Ambulatory Visit (INDEPENDENT_AMBULATORY_CARE_PROVIDER_SITE_OTHER): Admitting: Urgent Care

## 2024-01-09 ENCOUNTER — Other Ambulatory Visit: Payer: Self-pay

## 2024-01-09 VITALS — BP 151/102 | HR 91 | Temp 98.4°F | Ht 66.0 in | Wt 151.8 lb

## 2024-01-09 DIAGNOSIS — Z131 Encounter for screening for diabetes mellitus: Secondary | ICD-10-CM

## 2024-01-09 DIAGNOSIS — Z0000 Encounter for general adult medical examination without abnormal findings: Secondary | ICD-10-CM

## 2024-01-09 DIAGNOSIS — K7010 Alcoholic hepatitis without ascites: Secondary | ICD-10-CM

## 2024-01-09 DIAGNOSIS — E519 Thiamine deficiency, unspecified: Secondary | ICD-10-CM

## 2024-01-09 DIAGNOSIS — E612 Magnesium deficiency: Secondary | ICD-10-CM | POA: Diagnosis not present

## 2024-01-09 DIAGNOSIS — H00015 Hordeolum externum left lower eyelid: Secondary | ICD-10-CM

## 2024-01-09 DIAGNOSIS — Z13220 Encounter for screening for lipoid disorders: Secondary | ICD-10-CM

## 2024-01-09 DIAGNOSIS — Z1211 Encounter for screening for malignant neoplasm of colon: Secondary | ICD-10-CM

## 2024-01-09 DIAGNOSIS — R160 Hepatomegaly, not elsewhere classified: Secondary | ICD-10-CM

## 2024-01-09 DIAGNOSIS — Z0001 Encounter for general adult medical examination with abnormal findings: Secondary | ICD-10-CM

## 2024-01-09 DIAGNOSIS — I10 Essential (primary) hypertension: Secondary | ICD-10-CM

## 2024-01-09 DIAGNOSIS — F10288 Alcohol dependence with other alcohol-induced disorder: Secondary | ICD-10-CM

## 2024-01-09 LAB — CBC WITH DIFFERENTIAL/PLATELET
Basophils Absolute: 0 10*3/uL (ref 0.0–0.1)
Basophils Relative: 0.6 % (ref 0.0–3.0)
Eosinophils Absolute: 0.1 10*3/uL (ref 0.0–0.7)
Eosinophils Relative: 1.3 % (ref 0.0–5.0)
HCT: 37.2 % (ref 36.0–46.0)
Hemoglobin: 12.2 g/dL (ref 12.0–15.0)
Lymphocytes Relative: 19.1 % (ref 12.0–46.0)
Lymphs Abs: 1.1 10*3/uL (ref 0.7–4.0)
MCHC: 32.8 g/dL (ref 30.0–36.0)
MCV: 109.1 fl — ABNORMAL HIGH (ref 78.0–100.0)
Monocytes Absolute: 0.5 10*3/uL (ref 0.1–1.0)
Monocytes Relative: 8.9 % (ref 3.0–12.0)
Neutro Abs: 3.9 10*3/uL (ref 1.4–7.7)
Neutrophils Relative %: 70.1 % (ref 43.0–77.0)
Platelets: 207 10*3/uL (ref 150.0–400.0)
RBC: 3.41 Mil/uL — ABNORMAL LOW (ref 3.87–5.11)
RDW: 13.5 % (ref 11.5–15.5)
WBC: 5.5 10*3/uL (ref 4.0–10.5)

## 2024-01-09 LAB — COMPREHENSIVE METABOLIC PANEL WITH GFR
ALT: 35 U/L (ref 0–35)
AST: 115 U/L — ABNORMAL HIGH (ref 0–37)
Albumin: 4.3 g/dL (ref 3.5–5.2)
Alkaline Phosphatase: 70 U/L (ref 39–117)
BUN: 6 mg/dL (ref 6–23)
CO2: 25 meq/L (ref 19–32)
Calcium: 9.1 mg/dL (ref 8.4–10.5)
Chloride: 100 meq/L (ref 96–112)
Creatinine, Ser: 0.55 mg/dL (ref 0.40–1.20)
GFR: 108.83 mL/min (ref >60.00)
Glucose, Bld: 95 mg/dL (ref 70–99)
Potassium: 4.8 meq/L (ref 3.5–5.1)
Sodium: 136 meq/L (ref 135–145)
Total Bilirubin: 0.7 mg/dL (ref 0.2–1.2)
Total Protein: 7.3 g/dL (ref 6.0–8.3)

## 2024-01-09 LAB — LIPID PANEL
Cholesterol: 192 mg/dL (ref 0–200)
HDL: 110.7 mg/dL (ref >39.00)
LDL Cholesterol: 66 mg/dL (ref 0–99)
NonHDL: 80.86
Total CHOL/HDL Ratio: 2
Triglycerides: 72 mg/dL (ref 0.0–149.0)
VLDL: 14.4 mg/dL (ref 0.0–40.0)

## 2024-01-09 LAB — TSH: TSH: 2.53 u[IU]/mL (ref 0.35–5.50)

## 2024-01-09 LAB — HEMOGLOBIN A1C: Hgb A1c MFr Bld: 4.9 % (ref 4.6–6.5)

## 2024-01-09 MED ORDER — DOXYCYCLINE HYCLATE 100 MG PO TABS: 100.0000 mg | ORAL_TABLET | Freq: Two times a day (BID) | ORAL | 0 refills | Status: AC

## 2024-01-09 MED ORDER — NORETHINDRONE 0.35 MG PO TABS: 1.0000 | ORAL_TABLET | Freq: Every day | ORAL | 1 refills | Status: DC

## 2024-01-09 MED ORDER — CLONIDINE HCL 0.1 MG PO TABS: 0.0500 mg | ORAL_TABLET | Freq: Two times a day (BID) | ORAL | 0 refills | Status: DC

## 2024-01-09 NOTE — Progress Notes (Signed)
 Annual Wellness Visit     Patient: Michelle Myers, Female    DOB: 28-Jul-1976, 48 y.o.   MRN: [id number]  Subjective  Chief Complaint  Patient presents with   Annual Exam    Michelle Myers is a 48 y.o. female who presents today for her Annual Wellness Visit. She reports consuming a general diet. The patient does not participate in regular exercise at present. She generally feels poorly. She reports sleeping poorly. She does have additional problems to discuss today.   HPI  Discussed the use of AI scribe software for clinical note transcription with the patient, who gave verbal consent to proceed.  History of Present Illness   Michelle Myers is a 48 year old female who presents for an annual physical exam.  She is fasting for the exam, having had no food or drink except water for at least eight hours.  She has been experiencing intermittent rectal bleeding over the past couple of months, particularly following episodes of diarrhea. The blood is not mixed with the stool but rather pours out of the rectum. The last occurrence was on Saturday, following significant diarrhea.  She consumes a significant amount of alcohol and has a history of treatment for alcohol use disorder, including a year of sobriety after detox and a 28-day program at [facility]. She is concerned about her liver health and mentions a past recommendation for an abdominal ultrasound that she did not complete. Both of her brothers have a history of alcohol use disorder.  She has been experiencing a recurring stye or chalazion in her eye for six to eight months. Erythromycin eye ointment, previously used, is no longer effective. She is considering seeing an eye doctor due to this.  She is currently taking bupropion, carvedilol 25 mg twice daily, irbesartan, Micronor for birth control, and pantoprazole. She has stopped taking folic acid, magnesium, and thiamine due to side effects (smell weird) and perceived lack  of need. She occasionally takes propranolol for anxiety. She has a history of low magnesium levels, experiencing body twitches associated with it, and low thiamine levels, but has difficulty tolerating thiamine supplements due to their smell causing vomiting.  She smokes and has recently resumed smoking after quitting.  She has not had a menstrual period in two years, which she attributes to her use of Micronor, a progesterone-only birth control pill. This is managed by gynecology. Mammogram is scheduled for the next few months.      Vision:Not within last year  and Dental: No regular dental care    Patient Active Problem List   Diagnosis Date Noted   Hypersomnia due to alcohol (HCC) 05/19/2023   Tremor due to substance abuse 05/19/2023   Snoring 05/19/2023   Hyperreflexia 05/19/2023   Abnormal cervical Papanicolaou smear 02/04/2020   GERD (gastroesophageal reflux disease) 02/04/2020   Hyponatremia 05/18/2019   ETOH abuse    Syncope    Abnormal LFTs    Abdominal pain    Depression    Hypomagnesemia    Insomnia 01/22/2016   Elevated transaminase level 01/02/2016   UTI (urinary tract infection) 10/14/2015   Chest pain 09/11/2015   RHINITIS MEDICAMENTOSA 03/20/2010   Anxiety state 03/08/2010   TOBACCO USE 03/08/2010   Essential hypertension 03/08/2010   IBS 03/08/2010   Past Medical History:  Diagnosis Date   Alcoholic peripheral neuropathy (HCC)    Alcoholism (HCC)    Ongoing (many years) of abuse until 05/17/2019 admission for detox.   Anxiety  Elevated transaminase level    likely secondary to alcohol.  Viral Hep screens normal.   Hypertension    IFG (impaired fasting glucose)    08/2022 fasting 109.  Hba1c 5.6%   Magnesium deficiency    Rotator cuff tendonitis, right 2021   recalcitrant ->MRI 12/2019--tiny low grade partial thickness bursal region teres minor tear   Thiamin deficiency    Tobacco dependence    quit 2024   Past Surgical History:  Procedure  Laterality Date   BREAST BIOPSY Right 07/2018   benign (fibroadenoma)   CATARACT EXTRACTION W/ INTRAOCULAR LENS  IMPLANT, BILATERAL  2008 and 2010   CERVICAL CONE BIOPSY     for HPV   MOUTH SURGERY  2022   WISDOM TOOTH EXTRACTION     Social History   Tobacco Use   Smoking status: Every Day    Current packs/day: 0.75    Average packs/day: 0.8 packs/day for 24.0 years (18.0 ttl pk-yrs)    Types: Cigarettes   Smokeless tobacco: Never   Tobacco comments:    smoked 1992-present , up to 1.5 ppd  Vaping Use   Vaping status: Never Used  Substance Use Topics   Alcohol use: Not Currently    Alcohol/week: 0.0 standard drinks of alcohol   Drug use: No      Medications: Outpatient Medications Prior to Visit  Medication Sig   buPROPion (WELLBUTRIN XL) 150 MG 24 hr tablet TAKE 1 TABLET BY MOUTH EVERY DAY IN THE MORNING. MUST KEEP APPT FOR FURTHER REFILLS   carvedilol (COREG) 25 MG tablet TAKE 1 TABLET BY MOUTH TWICE A DAY   erythromycin ophthalmic ointment Place 1 Application into the left eye 3 (three) times daily.   irbesartan (AVAPRO) 150 MG tablet Take 1 tablet (150 mg total) by mouth daily.   pantoprazole (PROTONIX) 40 MG tablet Take 1 tablet (40 mg total) by mouth daily.   sertraline (ZOLOFT) 100 MG tablet Take 2 tablets (200 mg total) by mouth daily.   traZODone (DESYREL) 50 MG tablet TAKE 1 TABLET BY MOUTH EVERYDAY AT BEDTIME   [DISCONTINUED] norethindrone (MICRONOR) 0.35 MG tablet Take 1 tablet (0.35 mg total) by mouth daily.   [DISCONTINUED] Prenatal Vit-Fe Fumarate-FA (PRENATAL VITAMIN AND MINERAL) 28-0.8 MG TABS 2 a day po   [DISCONTINUED] Folic Acid 0.8 MG CAPS 1 cap po qd (Patient not taking: Reported on 01/09/2024)   [DISCONTINUED] magnesium chloride (SLOW-MAG) 64 MG TBEC SR tablet Take 2 tablets (128 mg total) by mouth daily. (Patient not taking: Reported on 01/09/2024)   [DISCONTINUED] propranolol (INDERAL) 10 MG tablet TAKE 1-2 TABS BY MOUTH TWICE DAILY AS NEEDED FOR  ANXIETY (Patient not taking: Reported on 01/09/2024)   [DISCONTINUED] thiamine (VITAMIN B1) 100 MG tablet Take 1 tablet (100 mg total) by mouth daily. (Patient not taking: Reported on 01/09/2024)   No facility-administered medications prior to visit.    Allergies  Allergen Reactions   Lisinopril     Angioedema   Epinephrine Other (See Comments)    Tachycardia, dizziness   Sulfa Antibiotics      ? reaction    Patient Care Team: Jeoffrey Massed, MD as PCP - General (Family Medicine) Marcelle Overlie, MD as Consulting Physician (Obstetrics and Gynecology) Pati Gallo, MD as Consulting Physician (Sports Medicine) Dohmeier, Porfirio Mylar, MD as Consulting Physician (Neurology)  ROS Complete 12 point ROS performed with all pertinent positives listed in HPI      Objective  BP (!) 151/102   Pulse 91   Temp  98.4 F (36.9 C)   Ht 5\' 6"  (1.676 m)   Wt 151 lb 12.8 oz (68.9 kg)   LMP  (LMP Unknown)   SpO2 100%   BMI 24.50 kg/m  BP Readings from Last 3 Encounters:  01/09/24 (!) 151/102  09/26/23 (!) 132/94  09/03/23 117/87   Wt Readings from Last 3 Encounters:  01/09/24 151 lb 12.8 oz (68.9 kg)  09/26/23 154 lb 12.8 oz (70.2 kg)  09/03/23 156 lb (70.8 kg)      Physical Exam Vitals and nursing note reviewed.  Constitutional:      General: She is not in acute distress.    Appearance: Normal appearance. She is not ill-appearing, toxic-appearing or diaphoretic.  HENT:     Head: Normocephalic and atraumatic.     Right Ear: Tympanic membrane, ear canal and external ear normal. There is no impacted cerumen.     Left Ear: Tympanic membrane, ear canal and external ear normal. There is no impacted cerumen.     Nose: Nose normal.     Mouth/Throat:     Mouth: Mucous membranes are moist.     Pharynx: Oropharynx is clear. No oropharyngeal exudate or posterior oropharyngeal erythema.  Eyes:     General: No scleral icterus.       Right eye: Hordeolum present. No discharge.        Left  eye: Hordeolum present.No discharge.     Extraocular Movements: Extraocular movements intact.     Pupils: Pupils are equal, round, and reactive to light.   Neck:     Thyroid: No thyroid mass, thyromegaly or thyroid tenderness.  Cardiovascular:     Rate and Rhythm: Normal rate and regular rhythm.     Pulses: Normal pulses.     Heart sounds: No murmur heard. Pulmonary:     Effort: Pulmonary effort is normal. No respiratory distress.     Breath sounds: Normal breath sounds. No stridor. No wheezing or rhonchi.  Abdominal:     General: Abdomen is flat. Bowel sounds are normal. There is no distension.     Palpations: Abdomen is soft. There is hepatomegaly. There is no fluid wave, splenomegaly, mass or pulsatile mass.     Tenderness: There is no abdominal tenderness. There is no guarding.    Musculoskeletal:     Cervical back: Normal range of motion and neck supple. No rigidity or tenderness.     Right lower leg: No edema.     Left lower leg: No edema.  Lymphadenopathy:     Cervical: No cervical adenopathy.  Skin:    General: Skin is warm and dry.     Coloration: Skin is not jaundiced.     Findings: No bruising, erythema or rash.  Neurological:     General: No focal deficit present.     Mental Status: She is alert and oriented to person, place, and time.     Cranial Nerves: Cranial nerves 2-12 are intact. No cranial nerve deficit or facial asymmetry.     Sensory: No sensory deficit.     Motor: No weakness.     Comments: Shaky voice Resting tremor of bilateral hands Intermittent muscle spasms noted of upper arms during assessment  Psychiatric:        Mood and Affect: Mood normal.        Behavior: Behavior normal.       Most recent functional status assessment:     No data to display  Most recent fall risk assessment:    04/08/2023   10:19 AM  Fall Risk   Falls in the past year? 0  Follow up Falls evaluation completed    Most recent depression screenings:     01/09/2024    8:02 AM 09/26/2023    8:19 AM  PHQ 2/9 Scores  PHQ - 2 Score 2 2  PHQ- 9 Score 8 9   Most recent cognitive screening:     No data to display         Most recent Audit-C alcohol use screening     No data to display         A score of 3 or more in women, and 4 or more in men indicates increased risk for alcohol abuse, EXCEPT if all of the points are from question 1   Vision/Hearing Screen: No results found.     Assessment & Plan   Annual wellness visit done today including the all of the following: Reviewed patient's Family Medical History Reviewed and updated list of patient's medical providers Assessment of cognitive impairment was done Assessed patient's functional ability Established a written schedule for health screening services Health Risk Assessent Completed and Reviewed  Exercise Activities and Dietary recommendations  Goals   None     Immunization History  Administered Date(s) Administered   Influenza Inj Mdck Quad Pf 08/24/2022   Influenza Whole 07/20/2008, 07/14/2013   Influenza, Seasonal, Injecte, Preservative Fre 08/06/2014, 07/26/2023   Influenza,inj,Quad PF,6+ Mos 08/05/2015, 07/28/2016, 08/10/2021   Influenza-Unspecified 06/14/2018, 07/16/2019, 07/14/2020   Moderna Covid-19 Vaccine Bivalent Booster 55yrs & up 08/12/2021   PFIZER(Purple Top)SARS-COV-2 Vaccination 01/04/2020, 01/25/2020, 10/23/2020   PNEUMOCOCCAL CONJUGATE-20 08/10/2021   Pfizer(Comirnaty)Fall Seasonal Vaccine 12 years and older 08/24/2022, 07/26/2023   Tdap 09/29/2013    Health Maintenance  Topic Date Due   Diabetic kidney evaluation - Urine ACR  Never done   Fecal DNA (Cologuard)  Never done   MAMMOGRAM  05/15/2023   DTaP/Tdap/Td (2 - Td or Tdap) 01/08/2025 (Originally 09/30/2023)   Diabetic kidney evaluation - eGFR measurement  01/08/2025   Cervical Cancer Screening (HPV/Pap Cotest)  04/18/2025   Pneumococcal Vaccine 85-59 Years old  Completed    INFLUENZA VACCINE  Completed   COVID-19 Vaccine  Completed   Hepatitis C Screening  Completed   HIV Screening  Completed   HPV VACCINES  Aged Out     Discussed health benefits of physical activity, and encouraged her to engage in regular exercise appropriate for her age and condition.    Problem List Items Addressed This Visit       Cardiovascular and Mediastinum   Essential hypertension   Relevant Medications   cloNIDine (CATAPRES) 0.1 MG tablet   Other Visit Diagnoses       Routine adult health maintenance    -  Primary   Relevant Orders   CBC with Differential/Platelet (Completed)   Hemoglobin A1c (Completed)   TSH (Completed)   Lipid panel (Completed)   Comprehensive metabolic panel with GFR (Completed)     Colon cancer screening       Relevant Orders   Cologuard     Magnesium deficiency         Hordeolum externum of left lower eyelid       Relevant Medications   doxycycline (VIBRA-TABS) 100 MG tablet     Thiamin deficiency       Relevant Orders   Vitamin B1     Alcoholic hepatitis without ascites  Relevant Orders   US Abdomen Complete     Hepatomegaly         Alcohol dependence with other alcohol-induced disorder (HCC)       Relevant Medications   cloNIDine (CATAPRES) 0.1 MG tablet      Assessment and Plan    Rectal Bleeding Intermittent rectal bleeding with diarrhea. Colonoscopy recommended due to visible blood. - Recommend colonoscopy for further evaluation. - Pt refused and requesting cologuard, but is aware of blood as an assay used  Alcohol Use Disorder Excessive alcohol consumption with relapse. Discussed gradual reduction to prevent withdrawal. Encouraged AA meetings and potential clonidine use for BP and tremors. Discussed genetic factors. - Gradually reduce alcohol intake by one serving per week. - Encourage return to [employer] - start clonidine for withdrawal symptoms and anxiety. - Monitor liver function and overall health closely,  ultrasound ordered  Hypertension Hypertension managed with carvedilol and irbesartan. Home monitoring advised. - Continue carvedilol and irbesartan. - add clonidine BID 1/2 tab - Encourage regular home blood pressure monitoring.  Hordeolum (Stye) Recurrent hordeolum not responding to erythromycin. Question possibility of ocular rosacea Warm compresses recommended. - Recommend warm compresses with [employer] baby shampoo. - trial of PO doxycycline - Refer to ophthalmologist for further evaluation if needed.  Annual Physical Examination Routine annual physical required. Fasting with completed biometric measurements. - Complete physical examination. - Fill out company physical form.  General Health Maintenance Due for mammogram. Discussed importance of regular screenings. - Ensure mammogram appointment in June is kept.  Follow-up Follow-up needed for alcohol use, liver health, and anxiety. Abdominal ultrasound required. - Schedule follow-up appointment to monitor progress on alcohol reduction and liver health. - Provide contact information for [facility] for abdominal ultrasound scheduling.        Return in about 2 weeks (around 01/23/2024).     Maretta Bees, PA

## 2024-01-09 NOTE — Patient Instructions (Addendum)
 [facility] Address: [address], [place], [place] [address] Phone: [phone] Call the number above to schedule the ultrasound of the abdomen.  Please get your mammogram performed as scheduled.  In regards to your alcohol intake, to prevent or at least slow progression of serious health complications, it is imperative that you slowly cut back and again stop. To do this, please cut back by one drink daily every 7 days (aka: If you currently drink 10 drinks daily, for the next week only drink 9.. then the following week only 8, etc)  I have prescribed clonidine to help with you cutting back on alcohol. This will prevent BP issues, tremor, etc. Please take this twice daily.   Your muscle cramps, spasms, and tremors are also all likely alcohol induced. Please CONTINUE your magnesium and thiamin supplements.  I have ordered cologuard per your request - keep in mind it will likely be positive due to history of blood in stool. If it is positive, you will need a colonoscopy.  Please contact an eye doctor to further discuss your continued styes. I have called in an oral antibiotic for you to take twice daily for the next week. Please also purchase the yellow [employer] baby shampoo bottle and gently massage the eyelids several times daily.   Please return for blood pressure recheck and follow up in 2 weeks.

## 2024-01-14 LAB — VITAMIN B1: Vitamin B1 (Thiamine): 6 nmol/L — ABNORMAL LOW (ref 8–30)

## 2024-01-15 ENCOUNTER — Encounter: Payer: Self-pay | Admitting: Urgent Care

## 2024-01-15 ENCOUNTER — Telehealth (HOSPITAL_BASED_OUTPATIENT_CLINIC_OR_DEPARTMENT_OTHER): Payer: Self-pay

## 2024-01-26 ENCOUNTER — Ambulatory Visit: Admitting: Family Medicine

## 2024-01-28 ENCOUNTER — Ambulatory Visit (HOSPITAL_BASED_OUTPATIENT_CLINIC_OR_DEPARTMENT_OTHER): Admission: RE | Admit: 2024-01-28 | Source: Ambulatory Visit

## 2024-02-17 ENCOUNTER — Other Ambulatory Visit: Payer: Self-pay

## 2024-02-17 DIAGNOSIS — I10 Essential (primary) hypertension: Secondary | ICD-10-CM

## 2024-02-17 DIAGNOSIS — F10288 Alcohol dependence with other alcohol-induced disorder: Secondary | ICD-10-CM

## 2024-02-17 MED ORDER — CLONIDINE HCL 0.1 MG PO TABS: 0.0500 mg | ORAL_TABLET | Freq: Two times a day (BID) | ORAL | 0 refills | Status: DC

## 2024-02-21 DIAGNOSIS — F10939 Alcohol use, unspecified with withdrawal, unspecified: Secondary | ICD-10-CM | POA: Diagnosis not present

## 2024-02-21 DIAGNOSIS — F419 Anxiety disorder, unspecified: Secondary | ICD-10-CM | POA: Diagnosis not present

## 2024-02-21 DIAGNOSIS — Z720 Tobacco use: Secondary | ICD-10-CM | POA: Diagnosis not present

## 2024-02-21 DIAGNOSIS — Z79899 Other long term (current) drug therapy: Secondary | ICD-10-CM | POA: Diagnosis not present

## 2024-02-21 DIAGNOSIS — G40509 Epileptic seizures related to external causes, not intractable, without status epilepticus: Secondary | ICD-10-CM | POA: Diagnosis not present

## 2024-02-21 DIAGNOSIS — D539 Nutritional anemia, unspecified: Secondary | ICD-10-CM | POA: Diagnosis not present

## 2024-02-21 DIAGNOSIS — Z793 Long term (current) use of hormonal contraceptives: Secondary | ICD-10-CM | POA: Diagnosis not present

## 2024-02-21 DIAGNOSIS — R410 Disorientation, unspecified: Secondary | ICD-10-CM | POA: Diagnosis not present

## 2024-02-21 DIAGNOSIS — F10239 Alcohol dependence with withdrawal, unspecified: Secondary | ICD-10-CM | POA: Diagnosis not present

## 2024-02-21 DIAGNOSIS — K219 Gastro-esophageal reflux disease without esophagitis: Secondary | ICD-10-CM | POA: Diagnosis not present

## 2024-02-21 DIAGNOSIS — R0902 Hypoxemia: Secondary | ICD-10-CM | POA: Diagnosis not present

## 2024-02-21 DIAGNOSIS — E871 Hypo-osmolality and hyponatremia: Secondary | ICD-10-CM | POA: Diagnosis not present

## 2024-02-21 DIAGNOSIS — F17210 Nicotine dependence, cigarettes, uncomplicated: Secondary | ICD-10-CM | POA: Diagnosis not present

## 2024-02-21 DIAGNOSIS — I10 Essential (primary) hypertension: Secondary | ICD-10-CM | POA: Diagnosis not present

## 2024-02-21 DIAGNOSIS — Z789 Other specified health status: Secondary | ICD-10-CM | POA: Diagnosis not present

## 2024-02-21 DIAGNOSIS — R569 Unspecified convulsions: Secondary | ICD-10-CM | POA: Diagnosis not present

## 2024-02-21 DIAGNOSIS — F32A Depression, unspecified: Secondary | ICD-10-CM | POA: Diagnosis not present

## 2024-02-21 DIAGNOSIS — K7010 Alcoholic hepatitis without ascites: Secondary | ICD-10-CM | POA: Diagnosis not present

## 2024-02-21 DIAGNOSIS — Z882 Allergy status to sulfonamides status: Secondary | ICD-10-CM | POA: Diagnosis not present

## 2024-02-24 ENCOUNTER — Encounter: Payer: Self-pay | Admitting: Family Medicine

## 2024-02-25 ENCOUNTER — Telehealth: Payer: Self-pay

## 2024-02-25 DIAGNOSIS — F411 Generalized anxiety disorder: Secondary | ICD-10-CM

## 2024-02-25 DIAGNOSIS — F3289 Other specified depressive episodes: Secondary | ICD-10-CM

## 2024-02-25 DIAGNOSIS — F1010 Alcohol abuse, uncomplicated: Secondary | ICD-10-CM

## 2024-02-25 NOTE — Transitions of Care (Post Inpatient/ED Visit) (Signed)
 02/25/2024  Name: Michelle Myers MRN: [id number] DOB: 1976/05/08  Today's TOC FU Call Status: Today's TOC FU Call Status:: Successful TOC FU Call Completed TOC FU Call Complete Date: 02/25/24 Patient's Name and Date of Birth confirmed.  Transition Care Management Follow-up Telephone Call Date of Discharge: 02/24/24 Discharge Facility: Other ([facility]) Name of Other ([facility]) Discharge Facility: [facility] Type of Discharge: Inpatient Admission Primary Inpatient Discharge Diagnosis:: Alcohol withdrawal syndrome with complication How have you been since you were released from the hospital?: Better (Slept well. But family hates me.) Any questions or concerns?: No  Items Reviewed: Did you receive and understand the discharge instructions provided?: Yes Medications obtained,verified, and reconciled?: Yes (Medications Reviewed) Any new allergies since your discharge?: No Dietary orders reviewed?: Yes Type of Diet Ordered:: Regular diet Do you have support at home?: Yes People in Home [RPT]: spouse, child(ren), adult Name of Support/Comfort Primary Source: Reports her support system is angry with her. (Encouraged patient to seek help or call brother)  Medications Reviewed Today: Medications Reviewed Today     Reviewed by Vanetta Generous, RN (Registered Nurse) on 02/25/24 at 1126  Med List Status: <None>   Medication Order Taking? Sig Documenting Provider Last Dose Status Informant  ALPRAZolam  (XANAX ) 0.5 MG tablet [id number] Yes Take 0.5 mg by mouth daily. As needed [provider] Taking Active   buPROPion  (WELLBUTRIN  XL) 150 MG 24 hr tablet [id number] Yes TAKE 1 TABLET BY MOUTH EVERY DAY IN THE MORNING. MUST KEEP APPT FOR FURTHER REFILLS McGowen, Minetta Aly, MD Taking Active   carvedilol  (COREG ) 25 MG tablet [id number] Yes TAKE 1 TABLET BY MOUTH TWICE A DAY McGowen, Minetta Aly, MD Taking Active   cloNIDine  (CATAPRES ) 0.1 MG tablet [id number] Yes Take 0.5 tablets (0.05 mg  total) by mouth 2 (two) times daily. Shelvia Dick, MD Taking Active   cyanocobalamin  (VITAMIN B12) 1000 MCG tablet [id number] Yes Take 1,000 mcg by mouth daily. [provider] Taking Active   erythromycin  ophthalmic ointment [id number] Yes Place 1 Application into the left eye 3 (three) times daily. Shelvia Dick, MD Taking Active   folic acid  (FOLVITE ) 1 MG tablet [id number] Yes Take 1 tablet by mouth daily. [provider] Taking Active   hydrOXYzine  (ATARAX ) 10 MG tablet [id number] Yes Take 10 mg by mouth 3 (three) times daily as needed for anxiety. [provider] Taking Active   irbesartan  (AVAPRO ) 150 MG tablet [id number] Yes Take 1 tablet (150 mg total) by mouth daily. McGowen, Philip H, MD Taking Active   norethindrone  (MICRONOR ) 0.35 MG tablet [id number] Yes Take 1 tablet (0.35 mg total) by mouth daily. McGowen, Philip H, MD Taking Active   pantoprazole  (PROTONIX ) 40 MG tablet [id number] Yes Take 1 tablet (40 mg total) by mouth daily. McGowen, Philip H, MD Taking Active   propranolol  (INDERAL ) 10 MG tablet [id number] Yes Take 10 mg by mouth 3 (three) times daily. As needed for anxiety [provider] Taking Active   sertraline  (ZOLOFT ) 100 MG tablet [id number] Yes Take 2 tablets (200 mg total) by mouth daily. Shelvia Dick, MD Taking Active   sodium chloride  1 g tablet [id number] Yes Take 1 g by mouth 2 (two) times daily with a meal. [provider] Taking Active   thiamine  (VITAMIN B1) 100 MG tablet [id number] Yes Take 100 mg by mouth daily. [provider] Taking Active   traZODone  (DESYREL ) 50 MG tablet [id number] Yes TAKE 1 TABLET BY MOUTH EVERYDAY AT BEDTIME Dohmeier, Raoul Byes,  MD Taking Active             Home Care and Equipment/Supplies: Were Home Health Services Ordered?: No Any new equipment or medical supplies ordered?: No  Functional Questionnaire: Do you need assistance with bathing/showering or dressing?: No Do you  need assistance with meal preparation?: No Do you need assistance with eating?: No Do you have difficulty maintaining continence: No Do you need assistance with getting out of bed/getting out of a chair/moving?: No Do you have difficulty managing or taking your medications?: No  Follow up appointments reviewed: PCP Follow-up appointment confirmed?: No (Patient will call and make an appointment.) MD Provider Line Number:[phone] Given: No Specialist Hospital Follow-up appointment confirmed?: NA Do you need transportation to your follow-up appointment?: No Do you understand care options if your condition(s) worsen?: Yes-patient verbalized understanding  SDOH Interventions Today    Flowsheet Row Most Recent Value  SDOH Interventions   Food Insecurity Interventions Intervention Not Indicated  Housing Interventions Intervention Not Indicated  Transportation Interventions Intervention Not Indicated  Utilities Interventions Intervention Not Indicated  Depression Interventions/Treatment  Medication         02/25/2024   11:05 AM 01/09/2024    8:02 AM 09/26/2023    8:19 AM 09/03/2023    8:32 AM 04/08/2023   10:19 AM  Depression screen PHQ 2/9  Decreased Interest 3 1 1 1 1   Down, Depressed, Hopeless 3 1 1 1 1   PHQ - 2 Score 6 2 2 2 2   Altered sleeping 2 1 1 1 3   Tired, decreased energy 3 2 2 2 3   Change in appetite 2 2 2 2 2   Feeling bad or failure about yourself  2 1 2 2 2   Trouble concentrating 0 0 0 0 0  Moving slowly or fidgety/restless 0 0 0 1 0  Suicidal thoughts 0 0 0 0 0  PHQ-9 Score 15 8 9 10 12   Difficult doing work/chores Somewhat difficult Somewhat difficult Somewhat difficult Somewhat difficult Somewhat difficult      02/25/2024   11:10 AM 01/09/2024    8:02 AM 09/26/2023    8:19 AM 09/03/2023    8:32 AM  GAD 7 : Generalized Anxiety Score  Nervous, Anxious, on Edge 3 3 1 2   Control/stop worrying 2 1 1 2   Worry too much - different things 2 1 1 2   Trouble  relaxing 0 0 0 0  Restless 0 0 0 0  Easily annoyed or irritable 2 1 0 1  Afraid - awful might happen 1 1 1 1   Total GAD 7 Score 10 7 4 8   Anxiety Difficulty Somewhat difficult Somewhat difficult Somewhat difficult Somewhat difficult   Discharged from [facility] yesterday after ETOH abuse and withdrawal with seizures.  Patient reports no support system. States that her husband and children are angry with her.  She states she has a brother and a mother that will help her. Patient reports that she has not any alcohol since 2 days prior to admission.   Reviewed medications with patient. Encouraged patient to call PCP and make an appointment. Patient has agreed to SW support and a referral was place. Patient declined nursing 30 day TOC program.  In basket message sent to PCP.  I provided my contact number for patient to call me if needed.  Orpha Blade, RN, BSN, CEN [employer]- Transition of Care Team.  [facility] [phone]

## 2024-02-26 ENCOUNTER — Telehealth: Payer: Self-pay

## 2024-02-26 NOTE — Progress Notes (Signed)
 Complex Care Management Note  Care Guide Note 02/26/2024 Name: Michelle Myers MRN: [id number] DOB: Jan 22, 1976  [name] is a 48 y.o. year old female who sees McGowen, Minetta Aly, MD for primary care. I reached out to Mercer Stakes by phone today to offer complex care management services.  [name] was given information about Complex Care Management services today including:   The Complex Care Management services include support from the care team which includes your Nurse Care Manager, Clinical Social Worker, or Pharmacist.  The Complex Care Management team is here to help remove barriers to the health concerns and goals most important to you. Complex Care Management services are voluntary, and the patient may decline or stop services at any time by request to their care team member.   Complex Care Management Consent Status: Patient agreed to services and verbal consent obtained.   Follow up plan:  Telephone appointment with complex care management team member scheduled for:  03/01/2024  Encounter Outcome:  Patient Scheduled  Lenton Rail , RMA     [place]  [facility], [facility] Guide  Direct Dial: [phone]  Website: [email or web address]

## 2024-02-27 ENCOUNTER — Encounter: Payer: Self-pay | Admitting: Family Medicine

## 2024-02-27 ENCOUNTER — Ambulatory Visit: Admitting: Family Medicine

## 2024-02-27 VITALS — BP 110/60 | HR 83 | Temp 98.4°F | Ht 66.0 in | Wt 153.2 lb

## 2024-02-27 DIAGNOSIS — R7401 Elevation of levels of liver transaminase levels: Secondary | ICD-10-CM

## 2024-02-27 DIAGNOSIS — F10932 Alcohol use, unspecified with withdrawal with perceptual disturbance: Secondary | ICD-10-CM

## 2024-02-27 DIAGNOSIS — I10 Essential (primary) hypertension: Secondary | ICD-10-CM

## 2024-02-27 DIAGNOSIS — F1020 Alcohol dependence, uncomplicated: Secondary | ICD-10-CM

## 2024-02-27 DIAGNOSIS — E871 Hypo-osmolality and hyponatremia: Secondary | ICD-10-CM

## 2024-02-27 DIAGNOSIS — R569 Unspecified convulsions: Secondary | ICD-10-CM

## 2024-02-27 MED ORDER — PANTOPRAZOLE SODIUM 40 MG PO TBEC: 40.0000 mg | DELAYED_RELEASE_TABLET | Freq: Every day | ORAL | 1 refills | Status: DC

## 2024-02-27 MED ORDER — IRBESARTAN 150 MG PO TABS: 150.0000 mg | ORAL_TABLET | Freq: Every day | ORAL | 1 refills | Status: DC

## 2024-02-27 NOTE — Progress Notes (Signed)
 02/27/2024  CC: No chief complaint on file.   Patient is a 48 y.o. female who presents for  hospital follow up, specifically [facility] face-to-face visit. Dates hospitalized: 02/21/2024 to 02/24/2024. Days since d/c from hospital: 3 days Patient was discharged from hospital to home. Reason for admission to hospital: Alcohol withdrawal seizure Date of interactive (phone) contact with patient and/or caregiver: 02/25/2024  I have reviewed patient's discharge summary plus pertinent specific notes, labs, and imaging from the hospitalization.   She had a tonic-clonic seizure.  Was withdrawing after having just recently stopped heavy alcohol use.  She received Librium , Ativan , gabapentin , and she did not have any further seizures in the hospital. On the day of discharge: White blood cell 5.2, hemoglobin 10.9, platelets 186.  Sodium 130, potassium 4.2, chloride 93, CO2 22, BUN 8, creatinine 0.75, magnesium  1.3. Alkaline phosphatase 73, ALT 41, AST 117, total bilirubin 1.0. Glucose ranged 95-185  Currently: Feeling tired, quite anxious.  Has not had alcohol at all since she was discharged home. Slightly tremulous but no seizure activity. No nausea, vomiting, diarrhea, or abdominal pain. She is not taking alprazolam  or trazodone . She will be excepted into [facility] but she has not 100% decided to go.  Discharge medications: Sodium chloride  1 g tab, 1 twice daily.  Alprazolam  0.5 mg daily as needed, Wellbutrin  XL 150 mg daily, Coreg  25 mg twice daily, vitamin B12 1000 mcg daily, folic acid  1 tab daily, hydroxyzine  10 mg daily as needed, irbesartan  150 mg daily, Micronor  1 tab daily, Protonix  40 mg daily, propranolol  10 mg twice daily as needed, thiamine  100 mg daily, trazodone  100 mg nightly as needed.  Medication reconciliation was done today and patient is not taking meds as recommended by discharging hospitalist/specialist--> not taking alprazolam , hydroxyzine , propranolol , or  trazodone .  ROS as above, plus--> no fevers, no CP, no SOB, no wheezing, no cough, no dizziness, no HAs, no rashes, no melena/hematochezia.  No polyuria or polydipsia.  No myalgias or arthralgias.  No focal weakness or paresthesias.  No acute vision or hearing abnormalities.  No dysuria or unusual/new urinary urgency or frequency.  No recent changes in lower legs. No palpitations.     PMH:  Past Medical History:  Diagnosis Date   Alcohol withdrawal seizure (HCC)    02/21/24   Alcoholic peripheral neuropathy (HCC)    Alcoholism (HCC)    Ongoing (many years) of abuse until 05/17/2019 admission for detox.   Anxiety    Elevated transaminase level    likely secondary to alcohol.  Viral Hep screens normal.   Hypertension    IFG (impaired fasting glucose)    08/2022 fasting 109.  Hba1c 5.6%   Magnesium  deficiency    Rotator cuff tendonitis, right 2021   recalcitrant ->MRI 12/2019--tiny low grade partial thickness bursal region teres minor tear   Thiamin deficiency    Tobacco dependence    quit 2024    PSH:  Past Surgical History:  Procedure Laterality Date   BREAST BIOPSY Right 07/2018   benign (fibroadenoma)   CATARACT EXTRACTION W/ INTRAOCULAR LENS  IMPLANT, BILATERAL  2008 and 2010   CERVICAL CONE BIOPSY     for HPV   MOUTH SURGERY  2022   WISDOM TOOTH EXTRACTION      MEDS:  Outpatient Medications Prior to Visit  Medication Sig Dispense Refill   buPROPion  (WELLBUTRIN  XL) 150 MG 24 hr tablet TAKE 1 TABLET BY MOUTH EVERY DAY IN THE MORNING. MUST KEEP APPT FOR FURTHER  REFILLS 15 tablet 0   carvedilol  (COREG ) 25 MG tablet TAKE 1 TABLET BY MOUTH TWICE A DAY 180 tablet 0   cloNIDine  (CATAPRES ) 0.1 MG tablet Take 0.5 tablets (0.05 mg total) by mouth 2 (two) times daily. 30 tablet 0   cyanocobalamin  (VITAMIN B12) 1000 MCG tablet Take 1,000 mcg by mouth daily.     folic acid  (FOLVITE ) 1 MG tablet Take 1 tablet by mouth daily.     irbesartan  (AVAPRO ) 150 MG tablet Take 1 tablet (150 mg  total) by mouth daily. 90 tablet 1   norethindrone  (MICRONOR ) 0.35 MG tablet Take 1 tablet (0.35 mg total) by mouth daily. 84 tablet 1   pantoprazole  (PROTONIX ) 40 MG tablet Take 1 tablet (40 mg total) by mouth daily. 90 tablet 1   sertraline  (ZOLOFT ) 100 MG tablet Take 2 tablets (200 mg total) by mouth daily. 180 tablet 1   sodium chloride  1 g tablet Take 1 g by mouth 2 (two) times daily with a meal.     thiamine  (VITAMIN B1) 100 MG tablet Take 100 mg by mouth daily.     traZODone  (DESYREL ) 50 MG tablet TAKE 1 TABLET BY MOUTH EVERYDAY AT BEDTIME 90 tablet 0   ALPRAZolam  (XANAX ) 0.5 MG tablet Take 0.5 mg by mouth daily. As needed (Patient not taking: Reported on 02/27/2024)     erythromycin  ophthalmic ointment Place 1 Application into the left eye 3 (three) times daily. (Patient not taking: Reported on 02/27/2024) 7 g 1   hydrOXYzine  (ATARAX ) 10 MG tablet Take 10 mg by mouth 3 (three) times daily as needed for anxiety. (Patient not taking: Reported on 02/27/2024)     propranolol  (INDERAL ) 10 MG tablet Take 10 mg by mouth 3 (three) times daily. As needed for anxiety (Patient not taking: Reported on 02/27/2024)     No facility-administered medications prior to visit.    Physical Exam    02/27/2024    2:06 PM 01/09/2024    8:43 AM 01/09/2024    7:59 AM  Vitals with BMI  Height 5\' 6"   5\' 6"   Weight 153 lbs 3 oz  151 lbs 13 oz  BMI 24.74  24.51  Systolic 110 151 161  Diastolic 60 102 113  Pulse 83  91   Gen: Alert, tired and anxious- appearing.  Patient is oriented to person, place, time, and situation. WRU:EAVW: no injection, icteris, swelling, or exudate.  EOMI, PERRLA. Mouth: lips without lesion/swelling.  Oral mucosa pink and moist. Oropharynx without erythema, exudate, or swelling.  CV: RRR, no m/r/g.   LUNGS: CTA bilat, nonlabored resps, good aeration in all lung fields. Slight resting tremor R arm >L arm.  Pertinent labs/imaging Last CBC Lab Results  Component Value Date   WBC 5.5  01/09/2024   HGB 12.2 01/09/2024   HCT 37.2 01/09/2024   MCV 109.1 (H) 01/09/2024   MCH 33.8 09/23/2020   RDW 13.5 01/09/2024   PLT 207.0 01/09/2024   Last metabolic panel Lab Results  Component Value Date   GLUCOSE 95 01/09/2024   NA 136 01/09/2024   K 4.8 01/09/2024   CL 100 01/09/2024   CO2 25 01/09/2024   BUN 6 01/09/2024   CREATININE 0.55 01/09/2024   GFR 108.83 01/09/2024   CALCIUM 9.1 01/09/2024   PROT 7.3 01/09/2024   ALBUMIN 4.3 01/09/2024   BILITOT 0.7 01/09/2024   ALKPHOS 70 01/09/2024   AST 115 (H) 01/09/2024   ALT 35 01/09/2024   ANIONGAP 12 09/23/2020   Last  thyroid  functions Lab Results  Component Value Date   TSH 2.53 01/09/2024   T3TOTAL 135 07/29/2019   Last vitamin B12 and Folate Lab Results  Component Value Date   VITAMINB12 401 03/17/2023   FOLATE 3.8 (L) 03/13/2022   Lab Results  Component Value Date   HGBA1C 4.9 01/09/2024   ASSESSMENT/PLAN:  1 alcohol withdrawal syndrome. She did have alcohol withdrawal seizure, prompting hospitalization. I strongly encouraged her to take the place she has been offered at [facility].  Continue thiamine  100 mg daily, vitamin B12 1000 mcg daily, and folic acid  milligram daily. No driving for at least 6 weeks. Complete metabolic panel and magnesium  today (follow-up elevated transaminases and low magnesium ).  #2 hypertension, controlled on Coreg  25 mg twice daily and irbesartan  150 mg daily Monitor electrolytes and creatinine today.  Medical decision making of high complexity was utilized today.  FOLLOW UP: Approximately 6 weeks  Signed:  Arletha Lady, MD           02/27/2024

## 2024-02-28 LAB — COMPREHENSIVE METABOLIC PANEL WITH GFR
AG Ratio: 1.3 (calc) (ref 1.0–2.5)
ALT: 14 U/L (ref 6–29)
AST: 15 U/L (ref 10–35)
Albumin: 3.7 g/dL (ref 3.6–5.1)
Alkaline phosphatase (APISO): 48 U/L (ref 31–125)
BUN: 11 mg/dL (ref 7–25)
CO2: 28 mmol/L (ref 20–32)
Calcium: 9 mg/dL (ref 8.6–10.2)
Chloride: 104 mmol/L (ref 98–110)
Creat: 0.6 mg/dL (ref 0.50–0.99)
Globulin: 2.8 g/dL (ref 1.9–3.7)
Glucose, Bld: 96 mg/dL (ref 65–99)
Potassium: 3.9 mmol/L (ref 3.5–5.3)
Sodium: 139 mmol/L (ref 135–146)
Total Bilirubin: 0.3 mg/dL (ref 0.2–1.2)
Total Protein: 6.5 g/dL (ref 6.1–8.1)
eGFR: 111 mL/min/{1.73_m2} (ref >=60)

## 2024-02-28 LAB — MAGNESIUM: Magnesium: 1.5 mg/dL (ref 1.5–2.5)

## 2024-03-01 ENCOUNTER — Other Ambulatory Visit: Payer: Self-pay | Admitting: Licensed Clinical Social Worker

## 2024-03-01 ENCOUNTER — Ambulatory Visit: Payer: Self-pay | Admitting: Family Medicine

## 2024-03-01 DIAGNOSIS — Z1211 Encounter for screening for malignant neoplasm of colon: Secondary | ICD-10-CM | POA: Diagnosis not present

## 2024-03-01 NOTE — Telephone Encounter (Signed)
 Primary Information  Source  [facility] [employer] (Patient)   Subject  [facility] (Patient)   Topic  Clinical - Lab/Test Results    Communication  Reason for CRM: Pt missed call - advise lab results were excellent per PCP.     Patient Information  Patient Name Gender DOB SSN  Michelle Myers, Michelle Myers Female 1976-01-09 [id number]   Contacts  Contact Date/Time Type Contact Phone/Fax  03/01/2024 01:00 PM EDT Phone (Incoming) [place] [phone]   Routing History   From To Priority  03/01/2024 01:05 PM Abram Hoguet, Clyde Darling D P [facility] Routine

## 2024-03-01 NOTE — Patient Outreach (Signed)
 Complex Care Management   Visit Note  03/01/2024  Name:  Michelle Myers MRN: [id number] DOB: [birth year]  Situation: Referral received for Complex Care Management related to substance use I obtained verbal consent from Patient.  Visit completed with patient  on the phone  Background:   Past Medical History:  Diagnosis Date   Alcohol withdrawal seizure (HCC)    02/21/24   Alcoholic peripheral neuropathy (HCC)    Alcoholism (HCC)    Ongoing (many years) of abuse until 05/17/2019 admission for detox.   Anxiety    Elevated transaminase level    likely secondary to alcohol.  Viral Hep screens normal.   Hypertension    IFG (impaired fasting glucose)    08/2022 fasting 109.  Hba1c 5.6%   Magnesium  deficiency    Rotator cuff tendonitis, right 2021   recalcitrant ->MRI 12/2019--tiny low grade partial thickness bursal region teres minor tear   Thiamin deficiency    Tobacco dependence    quit 2024    Assessment: Patient Reported Symptoms:  Cognitive Cognitive Status: Alert and oriented to person, place, and time, Insightful and able to interpret abstract concepts, Normal speech and language skills Cognitive/Intellectual Conditions Management [RPT]: None reported or documented in medical history or problem list   Health Maintenance Behaviors: Social activities, Stress management, Annual physical exam, Spiritual practice(s) Healing Pattern: Unsure Health Facilitated by: Stress management  Neurological Neurological Review of Symptoms: No symptoms reported Neurological Conditions: Seizures Neurological Comment: Recently hospitalized for seizures with alcohol withdrawal - will be doing inpatient treatment tomorrow  HEENT HEENT Symptoms Reported: No symptoms reported      Cardiovascular Cardiovascular Symptoms Reported: No symptoms reported    Respiratory Respiratory Symptoms Reported: No symptoms reported    Endocrine Patient reports the following symptoms related to hypoglycemia or  hyperglycemia : No symptoms reported Is patient diabetic?: No    Gastrointestinal Gastrointestinal Symptoms Reported: No symptoms reported      Genitourinary Genitourinary Symptoms Reported: No symptoms reported    Integumentary Integumentary Symptoms Reported: No symptoms reported    Musculoskeletal Musculoskelatal Symptoms Reviewed: Weakness Additional Musculoskeletal Details: Reports some overall weakness after hospitalization - has had follow up with PCP        Psychosocial Psychosocial Symptoms Reported: Depression - if selected complete PHQ 2-9, Anxiety - if selected complete GAD, Substance use Additional Psychological Details: Recently hospitalized with seizures during alcohol withdrawal. [facility] tomorrow (5/20), discussed strategies for sobriety after rehab - AA meetings (inperson/virtual), outpatient therapy, triggers for alcohol use - longterm counseling. Behavioral Health Conditions: Substance use Substance Use: Alcohol Behavioral Management Strategies: Medication therapy, Community resources, Complementary therapy(ies), Coping strategies Behavioral Health Self-Management Outcome: 3 (uncertain) Major Change/Loss/Stressor/Fears (CP): Environment, Relationship concerns Techniques to Cope with Loss/Stress/Change: Counseling, Diversional activities, Medication Quality of Family Relationships: helpful, involved, supportive Do you feel physically threatened by others?: No      03/01/2024    3:08 PM  Depression screen PHQ 2/9  Decreased Interest 2  Down, Depressed, Hopeless 3  PHQ - 2 Score 5  Altered sleeping 2  Tired, decreased energy 2  Change in appetite 2  Feeling bad or failure about yourself  2  Trouble concentrating 0  Moving slowly or fidgety/restless 0  Suicidal thoughts 0  PHQ-9 Score 13  Difficult doing work/chores Very difficult    There were no vitals filed for this visit.  Medications Reviewed Today      Reviewed by Jens Molder,  LCSW (Child psychotherapist) on 03/01/24 at 1320  Med List Status: <None>   Medication Order Taking? Sig Documenting Provider Last Dose Status Informant  buPROPion  (WELLBUTRIN  XL) 150 MG 24 hr tablet [id number] Yes TAKE 1 TABLET BY MOUTH EVERY DAY IN THE MORNING. MUST KEEP APPT FOR FURTHER REFILLS McGowen, Minetta Aly, MD Taking Active   carvedilol  (COREG ) 25 MG tablet [id number] Yes TAKE 1 TABLET BY MOUTH TWICE A DAY McGowen, Minetta Aly, MD Taking Active   cloNIDine  (CATAPRES ) 0.1 MG tablet [id number] No Take 0.5 tablets (0.05 mg total) by mouth 2 (two) times daily.  Patient not taking: Reported on 03/01/2024   Shelvia Dick, MD Not Taking Active   cyanocobalamin  (VITAMIN B12) 1000 MCG tablet [id number] Yes Take 1,000 mcg by mouth daily. [provider] Taking Active   folic acid  (FOLVITE ) 1 MG tablet [id number] Yes Take 1 tablet by mouth daily. [provider] Taking Active   irbesartan  (AVAPRO ) 150 MG tablet [id number] Yes Take 1 tablet (150 mg total) by mouth daily. McGowen, Philip H, MD Taking Active   norethindrone  (MICRONOR ) 0.35 MG tablet [id number] Yes Take 1 tablet (0.35 mg total) by mouth daily. McGowen, Philip H, MD Taking Active   pantoprazole  (PROTONIX ) 40 MG tablet [id number] Yes Take 1 tablet (40 mg total) by mouth daily. McGowen, Philip H, MD Taking Active   sertraline  (ZOLOFT ) 100 MG tablet [id number] Yes Take 2 tablets (200 mg total) by mouth daily. Shelvia Dick, MD Taking Active   sodium chloride  1 g tablet [id number] Yes Take 1 g by mouth 2 (two) times daily with a meal. [provider] Taking Active   thiamine  (VITAMIN B1) 100 MG tablet [id number] Yes Take 100 mg by mouth daily. [provider] Taking Active   traZODone  (DESYREL ) 50 MG tablet [id number] No TAKE 1 TABLET BY MOUTH EVERYDAY AT BEDTIME  Patient not taking: Reported on 03/01/2024   Dohmeier, Raoul Byes, MD Not Taking Active             Recommendation:    Attend inpatient rehab starting on 5/20. Review outpatient treatment options to utilize after discharge from inpatient treatment.  Follow Up Plan:   Telephone follow up appointment date/time:  04/07/2024  Hale Level, LCSW [place], [facility] Licensed Clinical Social Worker Care Coordinator [phone]

## 2024-03-01 NOTE — Patient Instructions (Signed)
 Visit Information  Thank you for taking time to visit with me today. Please don't hesitate to contact me if I can be of assistance to you before our next scheduled appointment.  Our next appointment is by telephone on 04/07/2024. Please call the care guide team at [phone] if you need to cancel or reschedule your appointment.   Following is a copy of your care plan:   Goals Addressed             This Visit's Progress    VBCI Social Work Care Plan       Problems:   Disease Management support and education needs related to substance use  CSW Clinical Goal(s):   Over the next 6 weeks the Patient will demonstrate a reduction in symptoms related to substance use .  Interventions:  Substance Misuse in a patient with Depression: depressed mood:   Evaluation of current treatment plan related to misuse of: alcohol Active listening / Reflection utilized Depression screen reviewed Emotional Support Provided Motivational Interviewing employed Problem Solving /Task Center strategies reviewed Provided general psycho-education for mental health needs Reviewed inpatient rehab options - pt will begin in patient rehab on 5/20  Patient Goals/Self-Care Activities:  Connect with provider for ongoing mental health treatment.   Continue taking your medication as prescribed.   Attend inpatient rehab starting on 03/02/2024  Plan:   Telephone follow up appointment with care management team member scheduled for:  04/07/2024        Please call the Suicide and Crisis Lifeline: 988 if you are experiencing a Mental Health or Behavioral Health Crisis or need someone to talk to.  Patient verbalizes understanding of instructions and care plan provided today and agrees to view in MyChart. Active MyChart status and patient understanding of how to access instructions and care plan via MyChart confirmed with patient.     Hale Level, LCSW [place], [facility] Licensed Clinical Social Worker Care Coordinator [phone]

## 2024-03-02 DIAGNOSIS — F1020 Alcohol dependence, uncomplicated: Secondary | ICD-10-CM | POA: Diagnosis not present

## 2024-03-08 LAB — COLOGUARD: COLOGUARD: NEGATIVE

## 2024-03-12 ENCOUNTER — Other Ambulatory Visit: Payer: Self-pay | Admitting: Family Medicine

## 2024-03-17 ENCOUNTER — Other Ambulatory Visit: Payer: Self-pay | Admitting: Family Medicine

## 2024-03-17 DIAGNOSIS — F10288 Alcohol dependence with other alcohol-induced disorder: Secondary | ICD-10-CM

## 2024-03-17 DIAGNOSIS — I10 Essential (primary) hypertension: Secondary | ICD-10-CM

## 2024-04-01 DIAGNOSIS — F1020 Alcohol dependence, uncomplicated: Secondary | ICD-10-CM | POA: Diagnosis not present

## 2024-04-05 ENCOUNTER — Encounter: Payer: Self-pay | Admitting: Family Medicine

## 2024-04-05 ENCOUNTER — Ambulatory Visit: Admitting: Family Medicine

## 2024-04-05 VITALS — BP 108/75 | HR 108 | Temp 98.3°F | Wt 154.4 lb

## 2024-04-05 DIAGNOSIS — G621 Alcoholic polyneuropathy: Secondary | ICD-10-CM

## 2024-04-05 DIAGNOSIS — I10 Essential (primary) hypertension: Secondary | ICD-10-CM

## 2024-04-05 DIAGNOSIS — F411 Generalized anxiety disorder: Secondary | ICD-10-CM

## 2024-04-05 DIAGNOSIS — F331 Major depressive disorder, recurrent, moderate: Secondary | ICD-10-CM

## 2024-04-05 DIAGNOSIS — F1020 Alcohol dependence, uncomplicated: Secondary | ICD-10-CM | POA: Diagnosis not present

## 2024-04-05 DIAGNOSIS — E538 Deficiency of other specified B group vitamins: Secondary | ICD-10-CM | POA: Diagnosis not present

## 2024-04-05 MED ORDER — SPIRONOLACTONE 25 MG PO TABS: 25.0000 mg | ORAL_TABLET | Freq: Every day | ORAL | 1 refills | Status: DC

## 2024-04-05 MED ORDER — VITAMIN B-12 1000 MCG PO TABS: 1000.0000 ug | ORAL_TABLET | Freq: Every day | ORAL | 3 refills | Status: AC

## 2024-04-05 MED ORDER — THIAMINE HCL 100 MG PO TABS: 100.0000 mg | ORAL_TABLET | Freq: Every day | ORAL | 1 refills | Status: AC

## 2024-04-05 NOTE — Progress Notes (Signed)
 OFFICE VISIT  04/05/2024  CC:  Chief Complaint  Patient presents with   Medical Management of Chronic Issues    Patient is a 48 y.o. female who presents for 5-week follow-up hypertension, alcoholism, and anxiety/depression. A/P as of last visit: 1 alcohol withdrawal syndrome. She did have alcohol withdrawal seizure, prompting hospitalization. I strongly encouraged her to take the place she has been offered at [facility].  Continue thiamine  100 mg daily, vitamin B12 1000 mcg daily, and folic acid  milligram daily. No driving for at least 6 weeks. Complete metabolic panel and magnesium  today (follow-up elevated transaminases and low magnesium ).   #2 hypertension, controlled on Coreg  25 mg twice daily and irbesartan  150 mg daily Monitor electrolytes and creatinine today.  INTERIM HX: All labs excellent last visit.  Just got out of [facility] a few days ago. Not doing well at home, feels overwhelmed.  Has started drinking again some. She has started intensive outpatient therapy.  She is on naltrexone.  No longer on bupropion  because of her increased risk of seizure. Was started on vitamin B12 injections as well as spironolactone when she was in [facility].  She does feel tingling and numbness in her feet. She is not sure if she is still taking her thiamine  or not.  No home blood pressure monitoring.  ROS --> no fevers, no CP, no SOB, no wheezing, no cough, no dizziness, no HAs, no rashes, no melena/hematochezia.  No polyuria or polydipsia.  No myalgias or arthralgias.  No focal weakness, paresthesias, or tremors.  No acute vision or hearing abnormalities.  No dysuria or unusual/new urinary urgency or frequency.  No recent changes in lower legs. No n/v/d or abd pain.  No palpitations.     Past Medical History:  Diagnosis Date   Alcohol withdrawal seizure (HCC)    02/21/24   Alcoholic peripheral neuropathy (HCC)    Alcoholism (HCC)    Ongoing (many years) of abuse  until 05/17/2019 admission for detox.   Anxiety    Elevated transaminase level    likely secondary to alcohol.  Viral Hep screens normal.   Hypertension    IFG (impaired fasting glucose)    08/2022 fasting 109.  Hba1c 5.6%   Magnesium  deficiency    Rotator cuff tendonitis, right 2021   recalcitrant ->MRI 12/2019--tiny low grade partial thickness bursal region teres minor tear   Thiamin deficiency    Tobacco dependence    quit 2024    Past Surgical History:  Procedure Laterality Date   BREAST BIOPSY Right 07/2018   benign (fibroadenoma)   CATARACT EXTRACTION W/ INTRAOCULAR LENS  IMPLANT, BILATERAL  2008 and 2010   CERVICAL CONE BIOPSY     for HPV   MOUTH SURGERY  2022   WISDOM TOOTH EXTRACTION      Outpatient Medications Prior to Visit  Medication Sig Dispense Refill   carvedilol  (COREG ) 25 MG tablet TAKE 1 TABLET BY MOUTH TWICE A DAY 60 tablet 0   cloNIDine  (CATAPRES ) 0.1 MG tablet TAKE 1/2 TABLET (0.05 MG TOTAL) BY MOUTH TWICE A DAY 45 tablet 0   folic acid  (FOLVITE ) 1 MG tablet Take 1 tablet by mouth daily.     hydrochlorothiazide  (MICROZIDE ) 12.5 MG capsule Take 12.5 mg by mouth daily.     irbesartan  (AVAPRO ) 150 MG tablet Take 1 tablet (150 mg total) by mouth daily. 90 tablet 1   naltrexone (DEPADE) 50 MG tablet Take 50 mg by mouth at bedtime.     norethindrone  (MICRONOR )  0.35 MG tablet Take 1 tablet (0.35 mg total) by mouth daily. 84 tablet 1   pantoprazole  (PROTONIX ) 40 MG tablet Take 1 tablet (40 mg total) by mouth daily. 90 tablet 1   sertraline  (ZOLOFT ) 100 MG tablet Take 2 tablets (200 mg total) by mouth daily. 180 tablet 1   sodium chloride  1 g tablet Take 1 g by mouth 2 (two) times daily with a meal.     cyanocobalamin  (VITAMIN B12) 1000 MCG tablet Take 1,000 mcg by mouth daily.     spironolactone (ALDACTONE) 25 MG tablet Take 25 mg by mouth daily.     thiamine  (VITAMIN B1) 100 MG tablet Take 100 mg by mouth daily.     traZODone  (DESYREL ) 50 MG tablet TAKE 1 TABLET  BY MOUTH EVERYDAY AT BEDTIME (Patient not taking: Reported on 04/05/2024) 90 tablet 0   buPROPion  (WELLBUTRIN  XL) 150 MG 24 hr tablet TAKE 1 TABLET BY MOUTH EVERY DAY IN THE MORNING. MUST KEEP APPT FOR FURTHER REFILLS (Patient not taking: Reported on 04/05/2024) 15 tablet 0   No facility-administered medications prior to visit.    Allergies  Allergen Reactions   Lisinopril      Angioedema   Epinephrine Other (See Comments)    Tachycardia, dizziness   Sulfa Antibiotics      ? reaction    Review of Systems As per HPI  PE:    04/05/2024    2:42 PM 02/27/2024    2:06 PM 01/09/2024    8:43 AM  Vitals with BMI  Height  5' 6   Weight 154 lbs 6 oz 153 lbs 3 oz   BMI  24.74   Systolic 108 110 848  Diastolic 75 60 102  Pulse 108 83      Physical Exam  General: Alert, not acutely ill. Affect: A bit anxious, tearful. She is oriented x 4, has lucid thought and speech. No further exam today.  LABS:  Last CBC Lab Results  Component Value Date   WBC 5.5 01/09/2024   HGB 12.2 01/09/2024   HCT 37.2 01/09/2024   MCV 109.1 (H) 01/09/2024   MCH 33.8 09/23/2020   RDW 13.5 01/09/2024   PLT 207.0 01/09/2024   Last metabolic panel Lab Results  Component Value Date   GLUCOSE 96 02/27/2024   NA 139 02/27/2024   K 3.9 02/27/2024   CL 104 02/27/2024   CO2 28 02/27/2024   BUN 11 02/27/2024   CREATININE 0.60 02/27/2024   EGFR 111 02/27/2024   CALCIUM 9.0 02/27/2024   PROT 6.5 02/27/2024   ALBUMIN 4.3 01/09/2024   BILITOT 0.3 02/27/2024   ALKPHOS 70 01/09/2024   AST 15 02/27/2024   ALT 14 02/27/2024   ANIONGAP 12 09/23/2020   Last lipids Lab Results  Component Value Date   CHOL 192 01/09/2024   HDL 110.70 01/09/2024   LDLCALC 66 01/09/2024   TRIG 72.0 01/09/2024   CHOLHDL 2 01/09/2024   Last hemoglobin A1c Lab Results  Component Value Date   HGBA1C 4.9 01/09/2024   Last thyroid  functions Lab Results  Component Value Date   TSH 2.53 01/09/2024   T3TOTAL 135  07/29/2019   Last vitamin B12 and Folate Lab Results  Component Value Date   VITAMINB12 401 03/17/2023   FOLATE 3.8 (L) 03/13/2022   IMPRESSION AND PLAN:  #1 hypertension, well-controlled on irbesartan  150 mg a day, HCTZ 12.5 mg a day, spironolactone 25 mg a day, and carvedilol  25 mg twice daily. Monitor basic metabolic panel  today.  #2 alcoholism. Struggling, just finished rehab. Gave encouragement today.  She is in intensive outpatient management/therapy. She is on naltrexone 50 mg a day. Also continue clonidine  0.1 mg tab, one half tab twice daily.  #3 GAD, recurrent major depressive disorder. Continue sertraline  200 mg a day. IOP as noted above.  #4 alcoholic peripheral neuropathy.  Also thiamine  deficiency. Restart thiamine  100 mg daily.  5.  Vitamin B12 deficiency.  She received 1000 mcg IM B12 x 1 week and then 1000 mcg IM weekly x 3 when in [facility] recently.  Check B12 level today. Start vitamin B12 1000 mcg sublingual tab once a day.  An After Visit Summary was printed and given to the patient.  FOLLOW UP: Return in about 4 weeks (around 05/03/2024) for routine chronic illness f/u.  Signed:  Gerlene Hockey, MD           04/05/2024

## 2024-04-06 DIAGNOSIS — F1020 Alcohol dependence, uncomplicated: Secondary | ICD-10-CM | POA: Diagnosis not present

## 2024-04-06 LAB — BASIC METABOLIC PANEL WITH GFR
BUN: 10 mg/dL (ref 6–23)
CO2: 27 meq/L (ref 19–32)
Calcium: 9.5 mg/dL (ref 8.4–10.5)
Chloride: 101 meq/L (ref 96–112)
Creatinine, Ser: 0.46 mg/dL (ref 0.40–1.20)
GFR: 113.42 mL/min (ref >60.00)
Glucose, Bld: 103 mg/dL — ABNORMAL HIGH (ref 70–99)
Potassium: 4.2 meq/L (ref 3.5–5.1)
Sodium: 136 meq/L (ref 135–145)

## 2024-04-07 ENCOUNTER — Other Ambulatory Visit: Payer: Self-pay | Admitting: Licensed Clinical Social Worker

## 2024-04-07 LAB — VITAMIN B12: Vitamin B-12: 575 pg/mL (ref 211–911)

## 2024-04-07 NOTE — Patient Outreach (Signed)
 Complex Care Management   Visit Note  04/07/2024  Name:  Michelle Myers MRN: [id number] DOB: May 01, 1976  Situation: Referral received for Complex Care Management related to Mental/Behavioral Health diagnosis substance use I obtained verbal consent from Patient.  Visit completed with patient  on the phone  Background:   Past Medical History:  Diagnosis Date   Alcohol withdrawal seizure (HCC)    02/21/24   Alcoholic peripheral neuropathy (HCC)    Alcoholism (HCC)    Ongoing (many years) of abuse until 05/17/2019 admission for detox.   Anxiety    Elevated transaminase level    likely secondary to alcohol.  Viral Hep screens normal.   Hypertension    IFG (impaired fasting glucose)    08/2022 fasting 109.  Hba1c 5.6%   Magnesium  deficiency    Rotator cuff tendonitis, right 2021   recalcitrant ->MRI 12/2019--tiny low grade partial thickness bursal region teres minor tear   Thiamin deficiency    Tobacco dependence    quit 2024    Assessment: Patient Reported Symptoms:  Cognitive Cognitive Status: Alert and oriented to person, place, and time, Normal speech and language skills, Insightful and able to interpret abstract concepts Cognitive/Intellectual Conditions Management [RPT]: None reported or documented in medical history or problem list   Health Maintenance Behaviors: Social activities, Stress management, Annual physical exam, Spiritual practice(s) Healing Pattern: Unsure Health Facilitated by: Stress management  Neurological Neurological Review of Symptoms: No symptoms reported    HEENT HEENT Symptoms Reported: Not assessed      Cardiovascular Cardiovascular Symptoms Reported: Not assessed    Respiratory Respiratory Symptoms Reported: Not assesed    Endocrine Patient reports the following symptoms related to hypoglycemia or hyperglycemia : Not assessed    Gastrointestinal Gastrointestinal Symptoms Reported: Not assessed      Genitourinary Genitourinary Symptoms  Reported: Not assessed    Integumentary Integumentary Symptoms Reported: Not assessed    Musculoskeletal Musculoskelatal Symptoms Reviewed: Not assessed        Psychosocial Psychosocial Symptoms Reported: Depression - if selected complete PHQ 2-9 Additional Psychological Details: Pt has exited inpatient rehab after 28 day stay - pt reports starting intensive counseling and group support. CSW and pt discussed starting AA group - pt has not done this consistently. We discussed motivations and behvioral activation for starting treatment. Pt plans on going to group meeting tomorrow and intensive counseling session. Behavioral Health Conditions: Substance use Substance Use: Alcohol Behavioral Management Strategies: Medication therapy, Community resources, Complementary therapy(ies), Coping strategies Behavioral Health Self-Management Outcome: 3 (uncertain) Major Change/Loss/Stressor/Fears (CP): Environment, Relationship concerns Techniques to Cope with Loss/Stress/Change: Counseling, Diversional activities, Medication Quality of Family Relationships: involved Do you feel physically threatened by others?: No      03/01/2024    3:08 PM  Depression screen PHQ 2/9  Decreased Interest 2  Down, Depressed, Hopeless 3  PHQ - 2 Score 5  Altered sleeping 2  Tired, decreased energy 2  Change in appetite 2  Feeling bad or failure about yourself  2  Trouble concentrating 0  Moving slowly or fidgety/restless 0  Suicidal thoughts 0  PHQ-9 Score 13  Difficult doing work/chores Very difficult    There were no vitals filed for this visit.  Medications Reviewed Today     Reviewed by Kit Alm LABOR, LCSW (Social Worker) on 04/07/24 at 1442  Med List Status: <None>   Medication Order Taking? Sig Documenting Provider Last Dose Status Informant  carvedilol  (COREG ) 25 MG tablet [id number] Yes TAKE 1 TABLET BY MOUTH  TWICE A DAY McGowen, Aleene DEL, MD  Active   cloNIDine  (CATAPRES ) 0.1 MG tablet  [id number] Yes TAKE 1/2 TABLET (0.05 MG TOTAL) BY MOUTH TWICE A DAY McGowen, Aleene DEL, MD  Active   cyanocobalamin  (VITAMIN B12) 1000 MCG tablet [id number] Yes Take 1 tablet (1,000 mcg total) by mouth daily. McGowen, Philip H, MD  Active   folic acid  (FOLVITE ) 1 MG tablet [id number] Yes Take 1 tablet by mouth daily. [provider]  Active   hydrochlorothiazide  (MICROZIDE ) 12.5 MG capsule [id number] Yes Take 12.5 mg by mouth daily. [provider]  Active   irbesartan  (AVAPRO ) 150 MG tablet [id number] Yes Take 1 tablet (150 mg total) by mouth daily. McGowen, Philip H, MD  Active   naltrexone (DEPADE) 50 MG tablet [id number] Yes Take 50 mg by mouth at bedtime. [provider]  Active   norethindrone  (MICRONOR ) 0.35 MG tablet [id number] Yes Take 1 tablet (0.35 mg total) by mouth daily. McGowen, Philip H, MD  Active   pantoprazole  (PROTONIX ) 40 MG tablet [id number] Yes Take 1 tablet (40 mg total) by mouth daily. McGowen, Philip H, MD  Active   sertraline  (ZOLOFT ) 100 MG tablet [id number] Yes Take 2 tablets (200 mg total) by mouth daily. McGowen, Philip H, MD  Active   sodium chloride  1 g tablet [id number] Yes Take 1 g by mouth 2 (two) times daily with a meal. [provider]  Active   spironolactone (ALDACTONE) 25 MG tablet [id number] Yes Take 1 tablet (25 mg total) by mouth daily. McGowen, Philip H, MD  Active   thiamine  (VITAMIN B1) 100 MG tablet [id number] Yes Take 1 tablet (100 mg total) by mouth daily. McGowen, Philip H, MD  Active   traZODone  (DESYREL ) 50 MG tablet [id number]  TAKE 1 TABLET BY MOUTH EVERYDAY AT BEDTIME  Patient not taking: Reported on 04/07/2024   Dohmeier, Dedra, MD  Active             Recommendation:   Pt has exited inpatient rehab after 28 day stay - pt reports starting intensive counseling and group support. CSW and pt discussed starting AA group - pt has not done this consistently. We discussed motivations and behvioral activation for starting  treatment. Pt plans on going to group meeting tomorrow and intensive counseling session   Follow Up Plan:   Telephone follow up appointment date/time:  04/21/2024  Alm Armor, LCSW [facility], [facility] Licensed Clinical Social Worker Care Coordinator [phone]

## 2024-04-08 ENCOUNTER — Ambulatory Visit: Payer: Self-pay | Admitting: Family Medicine

## 2024-04-08 DIAGNOSIS — F1020 Alcohol dependence, uncomplicated: Secondary | ICD-10-CM | POA: Diagnosis not present

## 2024-04-21 ENCOUNTER — Telehealth: Payer: Self-pay | Admitting: Licensed Clinical Social Worker

## 2024-04-21 ENCOUNTER — Encounter: Payer: Self-pay | Admitting: Licensed Clinical Social Worker

## 2024-04-29 ENCOUNTER — Other Ambulatory Visit: Payer: Self-pay | Admitting: Licensed Clinical Social Worker

## 2024-04-29 NOTE — Patient Outreach (Signed)
 Complex Care Management   Visit Note  04/29/2024  Name:  Michelle Myers MRN: [id number] DOB: August 15, 1976  Situation: Referral received for Complex Care Management related to Mental/Behavioral Health diagnosis Substance Use I obtained verbal consent from Patient.  Visit completed with patient  on the phone  Background:   Past Medical History:  Diagnosis Date   Alcohol withdrawal seizure (HCC)    02/21/24   Alcoholic peripheral neuropathy (HCC)    Alcoholism (HCC)    Ongoing (many years) of abuse until 05/17/2019 admission for detox.   Anxiety    Elevated transaminase level    likely secondary to alcohol.  Viral Hep screens normal.   Hypertension    IFG (impaired fasting glucose)    08/2022 fasting 109.  Hba1c 5.6%   Magnesium  deficiency    Rotator cuff tendonitis, right 2021   recalcitrant ->MRI 12/2019--tiny low grade partial thickness bursal region teres minor tear   Thiamin deficiency    Tobacco dependence    quit 2024    Assessment: Patient Reported Symptoms:  Cognitive Cognitive Status: Alert and oriented to person, place, and time, Normal speech and language skills, Insightful and able to interpret abstract concepts Cognitive/Intellectual Conditions Management [RPT]: None reported or documented in medical history or problem list   Health Maintenance Behaviors: Social activities, Stress management, Annual physical exam, Spiritual practice(s) Healing Pattern: Unsure Health Facilitated by: Stress management  Neurological Neurological Review of Symptoms: No symptoms reported    HEENT HEENT Symptoms Reported: No symptoms reported      Cardiovascular Cardiovascular Symptoms Reported: No symptoms reported    Respiratory Respiratory Symptoms Reported: Not assesed    Endocrine Endocrine Symptoms Reported: Not assessed    Gastrointestinal Gastrointestinal Symptoms Reported: Not assessed      Genitourinary Genitourinary Symptoms Reported: Not assessed    Integumentary  Integumentary Symptoms Reported: Not assessed    Musculoskeletal Musculoskelatal Symptoms Reviewed: Not assessed        Psychosocial Psychosocial Symptoms Reported: Substance use, Depression - if selected complete PHQ 2-9 Additional Psychological Details: CSW and pt discussed importance of seeking professional counseling for substance use. Pt is interested in going to substance use conseling services - did not want to go back to [facility], we discussed alternative in her local area. Pt agreed to reach out to them. Pt reported attending AA meetings a few times a week. CSW encouraged her to discuss substance use challeneges openly with sponser and PCP Behavioral Management Strategies: Medication therapy, Coping strategies, Support system, Support group Behavioral Health Self-Management Outcome: 3 (uncertain) Major Change/Loss/Stressor/Fears (CP): Environment, Relationship concerns Techniques to Cope with Loss/Stress/Change: Diversional activities, Medication Quality of Family Relationships: involved Do you feel physically threatened by others?: No      03/01/2024    3:08 PM  Depression screen PHQ 2/9  Decreased Interest 2  Down, Depressed, Hopeless 3  PHQ - 2 Score 5  Altered sleeping 2  Tired, decreased energy 2  Change in appetite 2  Feeling bad or failure about yourself  2  Trouble concentrating 0  Moving slowly or fidgety/restless 0  Suicidal thoughts 0  PHQ-9 Score 13  Difficult doing work/chores Very difficult    There were no vitals filed for this visit.  Medications Reviewed Today   Medications were not reviewed in this encounter     Recommendation:   CSW and pt discussed importance of seeking professional counseling for substance use. Pt is interested in going to substance use conseling services - did not want to  go back to [facility], we discussed alternative in her local area. Pt agreed to reach out to them. Pt reported attending AA meetings a few times a  week. CSW encouraged her to discuss substance use challeneges openly with sponser and PCP   Follow Up Plan:   Telephone follow up appointment date/time:  05/20/2024  Alm Armor, LCSW [facility], [facility] Licensed Clinical Social Worker Care Coordinator [phone]

## 2024-05-03 ENCOUNTER — Ambulatory Visit: Admitting: Family Medicine

## 2024-05-03 DIAGNOSIS — F10288 Alcohol dependence with other alcohol-induced disorder: Secondary | ICD-10-CM

## 2024-05-03 DIAGNOSIS — I10 Essential (primary) hypertension: Secondary | ICD-10-CM

## 2024-05-12 ENCOUNTER — Encounter: Payer: Self-pay | Admitting: Family Medicine

## 2024-05-12 ENCOUNTER — Ambulatory Visit: Admitting: Family Medicine

## 2024-05-12 VITALS — BP 111/76 | HR 90 | Temp 98.2°F | Ht 66.0 in | Wt 153.6 lb

## 2024-05-12 DIAGNOSIS — I10 Essential (primary) hypertension: Secondary | ICD-10-CM

## 2024-05-12 DIAGNOSIS — F411 Generalized anxiety disorder: Secondary | ICD-10-CM

## 2024-05-12 DIAGNOSIS — F1020 Alcohol dependence, uncomplicated: Secondary | ICD-10-CM

## 2024-05-12 DIAGNOSIS — Z1231 Encounter for screening mammogram for malignant neoplasm of breast: Secondary | ICD-10-CM

## 2024-05-12 MED ORDER — CARVEDILOL 25 MG PO TABS: 25.0000 mg | ORAL_TABLET | Freq: Two times a day (BID) | ORAL | 0 refills | Status: AC

## 2024-05-12 NOTE — Progress Notes (Signed)
 OFFICE VISIT  05/12/2024  CC:  Chief Complaint  Patient presents with   Medical Management of Chronic Issues    Pt is requesting refill of Aripirprazole 2mg     Patient is a 48 y.o. female who presents for 5-week follow-up hypertension, anxiety and depression, and alcoholic peripheral neuropathy. A/P as of last visit: #1 hypertension, well-controlled on irbesartan  150 mg a day, HCTZ 12.5 mg a day, spironolactone  25 mg a day, and carvedilol  25 mg twice daily. Monitor basic metabolic panel today.   #2 alcoholism. Struggling, just finished rehab. Gave encouragement today.  She is in intensive outpatient management/therapy. She is on naltrexone 50 mg a day. Also continue clonidine  0.1 mg tab, one half tab twice daily.   #3 GAD, recurrent major depressive disorder. Continue sertraline  200 mg a day. IOP as noted above.   #4 alcoholic peripheral neuropathy.  Also thiamine  deficiency. Restart thiamine  100 mg daily.   5.  Vitamin B12 deficiency.  She received 1000 mcg IM B12 x 1 week and then 1000 mcg IM weekly x 3 when in [facility] recently.  Check B12 level today. Start vitamin B12 1000 mcg sublingual tab once a day  INTERIM HX: She is doing fair.  She continues to drink. She is very fearful of another alcohol withdrawal seizure.  She knows she either has to detox under supervision or self wean gradually.  She is not sure if she is taking spironolactone  but she does take carvedilol , clonidine , HCTZ, and irbesartan .  She is overwhelmed with the number of pills she has to take so she is not taking a thiamine  supplement.  She got a multivitamin gummy that she takes 3 a per day  She does not take naltrexone or trazodone  anymore.  ROS as above, plus--> no fevers, no CP, no SOB, no wheezing, no cough, no dizziness, no HAs, no rashes, no melena/hematochezia.  No polyuria or polydipsia.  No myalgias or arthralgias.  No focal weakness, paresthesias, or tremors.  No acute vision or  hearing abnormalities.  No dysuria or unusual/new urinary urgency or frequency.  No recent changes in lower legs. No n/v/d or abd pain.  No palpitations.      Past Medical History:  Diagnosis Date   Alcohol withdrawal seizure (HCC)    02/21/24   Alcoholic peripheral neuropathy (HCC)    Alcoholism (HCC)    Ongoing (many years) of abuse until 05/17/2019 admission for detox.   Anxiety    Elevated transaminase level    likely secondary to alcohol.  Viral Hep screens normal.   Hypertension    IFG (impaired fasting glucose)    08/2022 fasting 109.  Hba1c 5.6%   Magnesium  deficiency    Rotator cuff tendonitis, right 2021   recalcitrant ->MRI 12/2019--tiny low grade partial thickness bursal region teres minor tear   Thiamin deficiency    Tobacco dependence    quit 2024    Past Surgical History:  Procedure Laterality Date   BREAST BIOPSY Right 07/2018   benign (fibroadenoma)   CATARACT EXTRACTION W/ INTRAOCULAR LENS  IMPLANT, BILATERAL  2008 and 2010   CERVICAL CONE BIOPSY     for HPV   MOUTH SURGERY  2022   WISDOM TOOTH EXTRACTION      Outpatient Medications Prior to Visit  Medication Sig Dispense Refill   cloNIDine  (CATAPRES ) 0.1 MG tablet TAKE 1/2 TABLET (0.05 MG TOTAL) BY MOUTH TWICE A DAY 45 tablet 0   hydrochlorothiazide  (MICROZIDE ) 12.5 MG capsule Take 12.5 mg by mouth  daily.     irbesartan  (AVAPRO ) 150 MG tablet Take 1 tablet (150 mg total) by mouth daily. 90 tablet 1   Multiple Vitamin (MULTIVITAMIN ADULT PO) Take by mouth daily.     norethindrone  (MICRONOR ) 0.35 MG tablet Take 1 tablet (0.35 mg total) by mouth daily. 84 tablet 1   pantoprazole  (PROTONIX ) 40 MG tablet Take 1 tablet (40 mg total) by mouth daily. 90 tablet 1   sertraline  (ZOLOFT ) 100 MG tablet Take 2 tablets (200 mg total) by mouth daily. 180 tablet 1   cyanocobalamin  (VITAMIN B12) 1000 MCG tablet Take 1 tablet (1,000 mcg total) by mouth daily. (Patient not taking: Reported on 05/12/2024) 90 tablet 3   folic  acid (FOLVITE ) 1 MG tablet Take 1 tablet by mouth daily. (Patient not taking: Reported on 05/12/2024)     sodium chloride  1 g tablet Take 1 g by mouth 2 (two) times daily with a meal. (Patient not taking: Reported on 05/12/2024)     spironolactone  (ALDACTONE ) 25 MG tablet Take 1 tablet (25 mg total) by mouth daily. (Patient not taking: Reported on 05/12/2024) 90 tablet 1   thiamine  (VITAMIN B1) 100 MG tablet Take 1 tablet (100 mg total) by mouth daily. (Patient not taking: Reported on 05/12/2024) 90 tablet 1   carvedilol  (COREG ) 25 MG tablet TAKE 1 TABLET BY MOUTH TWICE A DAY 60 tablet 0   naltrexone (DEPADE) 50 MG tablet Take 50 mg by mouth at bedtime. (Patient not taking: Reported on 05/12/2024)     traZODone  (DESYREL ) 50 MG tablet TAKE 1 TABLET BY MOUTH EVERYDAY AT BEDTIME (Patient not taking: Reported on 05/12/2024) 90 tablet 0   No facility-administered medications prior to visit.    Allergies  Allergen Reactions   Lisinopril      Angioedema   Epinephrine Other (See Comments)    Tachycardia, dizziness   Sulfa Antibiotics      ? reaction    Review of Systems As per HPI  PE:    05/12/2024    8:04 AM 04/05/2024    2:42 PM 02/27/2024    2:06 PM  Vitals with BMI  Height 5' 6  5' 6  Weight 153 lbs 10 oz 154 lbs 6 oz 153 lbs 3 oz  BMI 24.8  24.74  Systolic 111 108 889  Diastolic 76 75 60  Pulse 90 108 83    Physical Exam  Gen: alert and does not appear acutely ill. Affect is pleasant and anxious.  She has lucid thought and speech. No further exam today  LABS:  Last CBC Lab Results  Component Value Date   WBC 5.5 01/09/2024   HGB 12.2 01/09/2024   HCT 37.2 01/09/2024   MCV 109.1 (H) 01/09/2024   MCH 33.8 09/23/2020   RDW 13.5 01/09/2024   PLT 207.0 01/09/2024   Last metabolic panel Lab Results  Component Value Date   GLUCOSE 103 (H) 04/05/2024   NA 136 04/05/2024   K 4.2 04/05/2024   CL 101 04/05/2024   CO2 27 04/05/2024   BUN 10 04/05/2024   CREATININE 0.46  04/05/2024   GFR 113.42 04/05/2024   CALCIUM 9.5 04/05/2024   PROT 6.5 02/27/2024   ALBUMIN 4.3 01/09/2024   BILITOT 0.3 02/27/2024   ALKPHOS 70 01/09/2024   AST 15 02/27/2024   ALT 14 02/27/2024   ANIONGAP 12 09/23/2020   Last lipids Lab Results  Component Value Date   CHOL 192 01/09/2024   HDL 110.70 01/09/2024   LDLCALC 66 01/09/2024  TRIG 72.0 01/09/2024   CHOLHDL 2 01/09/2024   Last hemoglobin A1c Lab Results  Component Value Date   HGBA1C 4.9 01/09/2024   Last thyroid  functions Lab Results  Component Value Date   TSH 2.53 01/09/2024   T3TOTAL 135 07/29/2019   Last vitamin B12 and Folate Lab Results  Component Value Date   VITAMINB12 575 04/05/2024   FOLATE 3.8 (L) 03/13/2022   IMPRESSION AND PLAN:  #1 hypertension, well-controlled on irbesartan  150 mg a day, HCTZ 12.5 mg a day, spironolactone  25 mg a day, and carvedilol  25 mg twice daily. She will go home and clarify whether or not she takes spironolactone .  #2 alcoholism. Struggling, still drinking. Gave encouragement today.  She is in intensive outpatient management/therapy.  No longer on naltrexone. She will continue clonidine  0.1 mg tab, one half tab twice daily.   #3 GAD, recurrent major depressive disorder. Continue sertraline  200 mg a day.   #4 alcoholic peripheral neuropathy.  Also thiamine  deficiency. In her multivitamin she gets about 75 mg of thiamine  daily.   5.  Vitamin B12 deficiency.  She received 1000 mcg IM B12 x 1 week and then 1000 mcg IM weekly x 3 when in [facility] recently.   B12 level recheck about 5 weeks ago was 575. She is not taking any B12 supplement at this time.  An After Visit Summary was printed and given to the patient.  FOLLOW UP: Return in about 3 months (around 08/12/2024) for routine chronic illness f/u.  Signed:  Gerlene Hockey, MD           05/12/2024

## 2024-05-20 ENCOUNTER — Other Ambulatory Visit: Payer: Self-pay | Admitting: Licensed Clinical Social Worker

## 2024-05-20 NOTE — Patient Instructions (Signed)
 Visit Information  Thank you for taking time to visit with me today. Please don't hesitate to contact me if I can be of assistance to you before our next scheduled appointment.  Your next care management appointment is by telephone on 06/17/2024     Please call the care guide team at [phone] if you need to cancel, schedule, or reschedule an appointment.   Please call the Suicide and Crisis Lifeline: 988 if you are experiencing a Mental Health or Behavioral Health Crisis or need someone to talk to.  Alm Armor, LCSW [facility], [facility] Licensed Clinical Social Worker Care Coordinator [phone]

## 2024-05-20 NOTE — Patient Outreach (Signed)
 Complex Care Management   Visit Note  05/20/2024  Name:  Michelle Myers MRN: [id number] DOB: 1976/07/23  Situation: Referral received for Complex Care Management related to Substance Abuse/Misuse Alcohol  I obtained verbal consent from Patient.  Visit completed with patient  on the phone  Background:   Past Medical History:  Diagnosis Date   Alcohol  withdrawal seizure (HCC)    02/21/24   Alcoholic peripheral neuropathy (HCC)    Alcoholism (HCC)    Ongoing (many years) of abuse until 05/17/2019 admission for detox.   Anxiety    Elevated transaminase level    likely secondary to alcohol .  Viral Hep screens normal.   Hypertension    IFG (impaired fasting glucose)    08/2022 fasting 109.  Hba1c 5.6%   Magnesium  deficiency    Rotator cuff tendonitis, right 2021   recalcitrant ->MRI 12/2019--tiny low grade partial thickness bursal region teres minor tear   Thiamin deficiency    Tobacco dependence    quit 2024    Assessment: Patient Reported Symptoms:  Cognitive Cognitive Status: Alert and oriented to person, place, and time, Normal speech and language skills, Insightful and able to interpret abstract concepts Cognitive/Intellectual Conditions Management [RPT]: None reported or documented in medical history or problem list   Health Maintenance Behaviors: Social activities, Stress management, Annual physical exam, Spiritual practice(s) Healing Pattern: Unsure Health Facilitated by: Stress management  Neurological Neurological Review of Symptoms: No symptoms reported    HEENT HEENT Symptoms Reported: No symptoms reported      Cardiovascular Cardiovascular Symptoms Reported: No symptoms reported    Respiratory Respiratory Symptoms Reported: No symptoms reported    Endocrine Endocrine Symptoms Reported: Not assessed Is patient diabetic?: No    Gastrointestinal Gastrointestinal Symptoms Reported: Not assessed      Genitourinary Genitourinary Symptoms Reported: Not assessed     Integumentary Integumentary Symptoms Reported: Not assessed    Musculoskeletal Musculoskelatal Symptoms Reviewed: Not assessed        Psychosocial Psychosocial Symptoms Reported: Substance use, Depression - if selected complete PHQ 2-9 Additional Psychological Details: Patient plans on reaching out to private counseling services - does not want to participate with fellowship hall at this time. We reviewed what AA meetings she is going to and CSW encouraged her to reach back out to her sponser. CSW utilized motivational interviewing throughout conversation Behavioral Management Strategies: Medication therapy, Coping strategies, Support group, Support system Behavioral Health Self-Management Outcome: 3 (uncertain) Major Change/Loss/Stressor/Fears (CP): Environment, Relationship concerns Techniques to Cope with Loss/Stress/Change: Diversional activities, Medication Quality of Family Relationships: involved Do you feel physically threatened by others?: No      05/12/2024    8:10 AM  Depression screen PHQ 2/9  Decreased Interest 2  Down, Depressed, Hopeless 2  PHQ - 2 Score 4  Altered sleeping 0  Tired, decreased energy 1  Change in appetite 0  Feeling bad or failure about yourself  2  Trouble concentrating 0  Moving slowly or fidgety/restless 1  Suicidal thoughts 0  PHQ-9 Score 8  Difficult doing work/chores Somewhat difficult    There were no vitals filed for this visit.  Medications Reviewed Today     Reviewed by Kit Alm LABOR, LCSW (Social Worker) on 05/20/24 at [phone]  Med List Status: <None>   Medication Order Taking? Sig Documenting Provider Last Dose Status Informant  carvedilol  (COREG ) 25 MG tablet [id number] Yes Take 1 tablet (25 mg total) by mouth 2 (two) times daily. McGowen, Aleene DEL, MD  Active   cloNIDine  (  CATAPRES ) 0.1 MG tablet [id number] Yes TAKE 1/2 TABLET (0.05 MG TOTAL) BY MOUTH TWICE A DAY McGowen, Aleene DEL, MD  Active   cyanocobalamin  (VITAMIN B12) 1000 MCG  tablet [id number]  Take 1 tablet (1,000 mcg total) by mouth daily.  Patient not taking: Reported on 05/12/2024   McGowen, Philip H, MD  Active   folic acid  (FOLVITE ) 1 MG tablet [id number]  Take 1 tablet by mouth daily.  Patient not taking: Reported on 05/12/2024   [provider]  Active   hydrochlorothiazide  (MICROZIDE ) 12.5 MG capsule [id number] Yes Take 12.5 mg by mouth daily. [provider]  Active   irbesartan  (AVAPRO ) 150 MG tablet [id number] Yes Take 1 tablet (150 mg total) by mouth daily. McGowen, Philip H, MD  Active   Multiple Vitamin (MULTIVITAMIN ADULT PO) [id number] Yes Take by mouth daily. [provider]  Active   norethindrone  (MICRONOR ) 0.35 MG tablet [id number] Yes Take 1 tablet (0.35 mg total) by mouth daily. McGowen, Philip H, MD  Active   pantoprazole  (PROTONIX ) 40 MG tablet [id number] Yes Take 1 tablet (40 mg total) by mouth daily. McGowen, Philip H, MD  Active   sertraline  (ZOLOFT ) 100 MG tablet [id number] Yes Take 2 tablets (200 mg total) by mouth daily. Candise Aleene DEL, MD  Active   sodium chloride  1 g tablet [id number]  Take 1 g by mouth 2 (two) times daily with a meal.  Patient not taking: Reported on 05/12/2024   [provider]  Active   spironolactone  (ALDACTONE ) 25 MG tablet [id number]  Take 1 tablet (25 mg total) by mouth daily.  Patient not taking: Reported on 05/12/2024   McGowen, Philip H, MD  Active   thiamine  (VITAMIN B1) 100 MG tablet [id number]  Take 1 tablet (100 mg total) by mouth daily.  Patient not taking: Reported on 05/12/2024   McGowen, Aleene DEL, MD  Active             Recommendation:   Patient plans on reaching out to private counseling services - does not want to participate with fellowship hall at this time. We reviewed what AA meetings she is going to and CSW encouraged her to reach back out to her sponser. CSW utilized motivational interviewing throughout conversation  Follow Up Plan:   Telephone follow up  appointment date/time:  06/17/2024  Alm Armor, LCSW [facility], [facility] Licensed Clinical Social Worker Care Coordinator [phone]

## 2024-06-04 ENCOUNTER — Ambulatory Visit
Admission: RE | Admit: 2024-06-04 | Discharge: 2024-06-04 | Disposition: A | Discharge status: Home / Self Care | Source: Ambulatory Visit | Attending: Family Medicine | Admitting: Family Medicine

## 2024-06-04 DIAGNOSIS — Z1231 Encounter for screening mammogram for malignant neoplasm of breast: Secondary | ICD-10-CM | POA: Diagnosis not present

## 2024-06-17 ENCOUNTER — Telehealth: Payer: Self-pay | Admitting: Licensed Clinical Social Worker

## 2024-06-17 ENCOUNTER — Encounter: Payer: Self-pay | Admitting: Licensed Clinical Social Worker

## 2024-06-25 ENCOUNTER — Other Ambulatory Visit: Payer: Self-pay | Admitting: Family Medicine

## 2024-06-29 ENCOUNTER — Encounter: Payer: Self-pay | Admitting: Licensed Clinical Social Worker

## 2024-06-29 ENCOUNTER — Telehealth: Payer: Self-pay | Admitting: Licensed Clinical Social Worker

## 2024-07-15 ENCOUNTER — Encounter: Payer: Self-pay | Admitting: Licensed Clinical Social Worker

## 2024-08-04 DIAGNOSIS — F10139 Alcohol abuse with withdrawal, unspecified: Secondary | ICD-10-CM | POA: Diagnosis not present

## 2024-08-04 DIAGNOSIS — R7401 Elevation of levels of liver transaminase levels: Secondary | ICD-10-CM | POA: Diagnosis not present

## 2024-08-04 DIAGNOSIS — R197 Diarrhea, unspecified: Secondary | ICD-10-CM | POA: Diagnosis not present

## 2024-08-04 DIAGNOSIS — K760 Fatty (change of) liver, not elsewhere classified: Secondary | ICD-10-CM | POA: Diagnosis not present

## 2024-08-04 DIAGNOSIS — N179 Acute kidney failure, unspecified: Secondary | ICD-10-CM | POA: Diagnosis not present

## 2024-08-04 DIAGNOSIS — R748 Abnormal levels of other serum enzymes: Secondary | ICD-10-CM | POA: Diagnosis not present

## 2024-08-04 DIAGNOSIS — I10 Essential (primary) hypertension: Secondary | ICD-10-CM | POA: Diagnosis not present

## 2024-08-04 DIAGNOSIS — E876 Hypokalemia: Secondary | ICD-10-CM | POA: Diagnosis not present

## 2024-08-04 DIAGNOSIS — K2920 Alcoholic gastritis without bleeding: Secondary | ICD-10-CM | POA: Diagnosis not present

## 2024-08-04 DIAGNOSIS — F17210 Nicotine dependence, cigarettes, uncomplicated: Secondary | ICD-10-CM | POA: Diagnosis not present

## 2024-08-04 DIAGNOSIS — Z882 Allergy status to sulfonamides status: Secondary | ICD-10-CM | POA: Diagnosis not present

## 2024-08-04 DIAGNOSIS — K219 Gastro-esophageal reflux disease without esophagitis: Secondary | ICD-10-CM | POA: Diagnosis not present

## 2024-08-04 DIAGNOSIS — I959 Hypotension, unspecified: Secondary | ICD-10-CM | POA: Diagnosis not present

## 2024-08-04 DIAGNOSIS — D649 Anemia, unspecified: Secondary | ICD-10-CM | POA: Diagnosis not present

## 2024-08-08 NOTE — Discharge Summary (Signed)
 [facility]  [facility] Inpatient Discharge Summary  PCP: Aleene VEAR Hockey, MD Discharge Details   Admit date:         08/04/2024 Discharge date:        08/08/2024  Hospital Days:    4 days  Code Status:   Full Code Advanced Directives on file: No Directive        Discharge Diagnoses:  Principal Problem:   Acute kidney failure Active Problems:   Alcohol  withdrawal (*)   Macrocytic anemia   Essential hypertension   Tobacco use   Elevated lipase   ETOH abuse   GERD (gastroesophageal reflux disease)   Alcoholic pancreatitis (*)   Acute alcoholic gastritis without hemorrhage   Acute renal failure   Task list for follow-up:   follow-up with PCP within a week.  Will recommend CBC, CMP, lipase checked within a week  GI referral  absteinance from alcohol  Unresulted Labs     Order Current Status   Culture, Blood Blood Vein Preliminary result   Culture, Blood Blood Vein Preliminary result       Follow-Up Appointments Suggested: Follow-up with Primary Care Physician   hospital follow up  Aleene VEAR Hockey, MD [address] [place] [address] [phone]     Ambulatory referral to Gastroenterology   elevated liver enzymes  Follow-Up Appointments Already Scheduled: No future appointments.  Discharge Medications:   Current Discharge Medication List     START taking these medications      Details  Thiamine  Mononitrate 100 MG Tabs Start taking on: August 09, 2024  Take one tablet (100 mg dose) by mouth daily. Quantity: 30 tablet       CONTINUE these medications which have NOT CHANGED      Details  ADULT GUMMY PO  Take 1 Piece by mouth daily.   carvedilol  25 mg tablet Commonly known as: COREG   Take one tablet (25 mg dose) by mouth 2 (two) times daily.   hydrOXYzine  HCl 10 mg tablet Commonly known as: ATARAX   Take one tablet (10 mg dose) by mouth daily as needed for Anxiety.   irbesartan  150 MG  tablet Commonly known as: AVAPRO   Take one tablet (150 mg dose) by mouth at bedtime.   naltrexone 50 mg tablet Commonly known as: REVIA  Take one tablet (50 mg dose) by mouth daily.   norethindrone  0.35 MG tablet Commonly known as: ORTHO MICRONOR ,CAMILA ,NORA-BE,ERRIN ,JOLIVETTE,HEATHER ,SHAROBEL   Take one tablet (0.35 mg dose) by mouth daily.   pantoprazole  sodium 40 mg tablet Commonly known as: PROTONIX   Take one tablet (40 mg dose) by mouth daily.   propranolol  HCl 10 mg tablet Commonly known as: INDERAL   TAKE 1-2 TABS BY MOUTH TWICE DAILY AS NEEDED FOR ANXIETY   sertraline  50 mg tablet Commonly known as: ZOLOFT   Take three tablets (150 mg dose) by mouth every morning.   spironolactone  25 mg tablet Commonly known as: ALDACTONE   Take one tablet (25 mg dose) by mouth daily.   traZODone  100 mg tablet Commonly known as: DESYREL   Take one tablet (100 mg dose) by mouth at bedtime as needed for Sleep.      * You might also be taking other medications not listed above. If you have questions about any of your other medications, talk to the person who prescribed them or your Primary Care Provider.          STOP taking these medications    ALPRAZolam  0.5 mg tablet Commonly known  as: XANAX    buPROPion  HCl 150 mg 24 hr tablet Commonly known as: WELLBUTRIN  XL   cloNIDine  0.1 mg tablet Commonly known as: CATAPRES    hydroCHLOROthiazide  12.5 mg capsule   sodium chloride  1 g tablet        Allergies: Allergies[1]  Consultations this Admission: IP CONSULT TO PSYCHIATRY / BH IP CONSULT TO CASE MANAGEMENT, RN/SW  Procedures/Imaging:     CT Abdomen Pelvis WO IV Contrast  Final Result  IMPRESSION:    1. No visualized acute abnormality.  2. Hepatic steatosis.    Electronically Signed by: Reyes Luna, MD on 08/04/2024 12:33 PM      Pertinent Labs:  Cardiac Labs: No results for input(s): CK, CKMB, CTNI, BNP in the last 168 hours. CBC: Recent Labs     Units 08/04/24 1023  WBC thou/mcL 5.5  HGB gm/dL 88.7  PLT thou/mcL 855*   BMP: Recent Labs    Units 08/08/24 0404 08/07/24 0334 08/06/24 0223 08/05/24 1635 08/05/24 0235 08/04/24 1023  NA mmol/L 142 135* 135* 133* 136 131*  K mmol/L 3.2* 3.4* 3.8 3.8 2.8* 4.0  CL mmol/L 106 100 101 97 93* 78*  CO2 mmol/L 25 23 28 27 28 23   BUN mg/dL 3* 5* 9 15 35* 49*  CREATININE mg/dL 9.57* 9.57* 9.51* 9.41 1.02* 2.86*  MAGNESIUM  mg/dL  --  1.4* 1.7  --  2.2 1.9   Lipid Panel: No results for input(s): CHOL, TRIG, HDL, LDL in the last 168 hours. Liver Enzymes: Recent Labs    Units 08/08/24 0404 08/07/24 0334 08/06/24 0223 08/05/24 1635 08/05/24 0235 08/04/24 1023  INR   --   --   --   --  1.1  --   AST U/L 115* 152* 82* 82* 85* 127*  ALT U/L 57* 61* 41* 42* 47* 79*  ALKPHOS U/L 66 72 60 58 55 76  BILITOT mg/dL 0.4 0.7 1.1 1.1 1.0 2.1*   Endocrine Panels: Recent Labs    Units 08/08/24 0404 08/07/24 1552 08/07/24 0334 08/06/24 0223 08/05/24 1635 08/05/24 0235 08/04/24 1023  HGBA1C %  --   --   --   --   --  4.4*  --   GLUCOSE mg/dL 91 892* 889* 84 856* 879* [facility] Course   Physicians involved in care during this hospitalization Attending Provider: Darleene Norleen Railing, MD Attending Provider: Candido Pebbles, MD Attending Provider: Derryl Duval, MD Admitting Provider: Darleene Norleen Railing, MD Consulting Physician: Athena Consult To Psychiatry Consulting Physician: Cathlean FORBES Lanius, MD Consulting Physician: Jeanmarie Paul Mvula, PA-C  HPI Rolande course  48 year old female with past medical history of tobacco abuse, hypertension and ongoing alcohol  abuse presented to the emergency room on morning of 10/22 with complaints of continuous nausea and vomiting times plus diarrhea for almost 1 week.  Workup revealed transaminitis with AST greater than ALT, AKI with creatinine of 2.86 and lipase of almost 1200.  Patient denies any specific abdominal pain.   She started on aggressive IV fluids and admitted to the hospitalist service.   CT scan of the abdomen and pelvis on 10/22 was without contrast, did not show any pancreatitis or any other significant issues except for fatty liver.  Patient was managed conservatively with n.p.o., aggressive IV fluid. Her creatinine normalized.  Lipase trended down but stayed around 500.  AST and ALT ALT are trending down but is still elevated 152/61.  Patient's abdominal pain has resolved and she is able to tolerate diet well.  she did not have any significant withdrawal symptoms.  She was felt stable to discharge home.  She is recommended to follow-up with PCP within a week and get lab for to ensure is stable electrolytes and kidney function as well as hepatic panel..  We also recommend checking lipase level at this time which  interestingly is still elevated.    We held her antihypertensives and diuretics including HCTZ, spironolactone , Avapro  in the hospital.  We discontinued hydrochlorothiazide  but resumed spironolactone ,  Carvedilol  and Avapro  on discharge.     Patient is tearful on discharge and was not to drink alcohol  anymore.  She says she has sufficient information about AA and is planning to join them.      BP (!) 155/94   Pulse 70   Temp 98.3 F (36.8 C) (Oral)   Resp 16   Ht 1.638 m (5' 4.5)   Wt 59 kg (130 lb)   LMP  (LMP Unknown)   SpO2 99%   BMI 21.97 kg/m   Physical Exam  Physical exam: General: Alert, oriented, not in any acute distress HEENT: PERRLA, EOMI, moist oral mucosa Chest: Clear to auscultation bilaterally, no wheezing or crepitations CVS: Regular rate and rhythm, normal heart sounds, no murmur Abdomen: Soft, nontender, no organomegaly Extremities: No rashes, no peripheral edema, peripheral Pulses intact Neuro: PERRLA, EOMI, no obvious facial weakness, strength symmetric bilaterally, sensation grossly intact bilaterally    Post Hospital Care   Activity:   Weight  Bearing Status:          Oxygen Orders for Discharge: O2 Device: None (Room air) SpO2: 99 % O2 Flow Rate (L/min): 0 L/min  Diet: Diet and Nourishment Orders (From admission, onward)     Start       08/08/24 0000  Regular Diet (2 gm sodium restriction)          08/07/24 1509  Full liquid diet  Diet effective now                   Wound Care Recommendations:    Lines/Drains/Airways: Patient Lines/Drains/Airways Status     Active LDAs     None            Therapy Recommendations:  PT:                OT:          SLP:              Home Health Orders: DME Orders (From admission, onward)    None      Home Health Agency     None       I spent 35 minutes performing discharge services.   Electronically signed: Derryl Duval, MD 08/08/2024 / 3:17 PM       [1] Allergies Allergen Reactions  . Sulfa Antibiotics Unknown  *Some images could not be shown.

## 2024-08-08 NOTE — Nursing Note (Signed)
 Acknowledgement of AVS Instructions  Discharge instructions have been reviewed with the patient/support person and provided a copy of the discharge instructions. Also, given the opportunity for a demonstration of specific follow-up care tasks. Patient's husband is at the bedside and will give a ride to home.

## 2024-08-09 ENCOUNTER — Telehealth: Payer: Self-pay

## 2024-08-09 NOTE — Transitions of Care (Post Inpatient/ED Visit) (Signed)
   08/09/2024  Name: NALEIGHA RAIMONDI MRN: [id number] DOB: [birth year]  Today's TOC FU Call Status: Today's TOC FU Call Status:: Unsuccessful Call (2nd Attempt) Unsuccessful Call (2nd Attempt) Date: 08/09/24  Attempted to reach the patient regarding the most recent Inpatient/ED visit.  Follow Up Plan: Additional outreach attempts will be made to reach the patient to complete the Transitions of Care (Post Inpatient/ED visit) call.   Alan Ee, RN, BSN, CEN [employer]- Transition of Care Team.  [facility] [phone]

## 2024-08-09 NOTE — Transitions of Care (Post Inpatient/ED Visit) (Signed)
   08/09/2024  Name: Michelle Myers MRN: [id number] DOB: August 04, 1976  Today's TOC FU Call Status: Today's TOC FU Call Status:: Unsuccessful Call (1st Attempt) Unsuccessful Call (1st Attempt) Date: 08/09/24  Attempted to reach the patient regarding the most recent Inpatient/ED visit.  Follow Up Plan: Additional outreach attempts will be made to reach the patient to complete the Transitions of Care (Post Inpatient/ED visit) call.   Alan Ee, RN, BSN, CEN [employer]- Transition of Care Team.  [facility] [phone]

## 2024-08-10 ENCOUNTER — Telehealth: Payer: Self-pay

## 2024-08-10 NOTE — Transitions of Care (Post Inpatient/ED Visit) (Signed)
 08/10/2024  Name: Michelle Myers MRN: [id number] DOB: [birth year]  Today's TOC FU Call Status: Today's TOC FU Call Status:: Successful TOC FU Call Completed TOC FU Call Complete Date: 08/10/24 Patient's Name and Date of Birth confirmed.  Transition Care Management Follow-up Telephone Call How have you been since you were released from the hospital?: Better (I feel alot better-  having headaches) Any questions or concerns?: No  Items Reviewed: Did you receive and understand the discharge instructions provided?: Yes Medications obtained,verified, and reconciled?: Yes (Medications Reviewed) Any new allergies since your discharge?: No Dietary orders reviewed?: NA Do you have support at home?: Yes People in Home [RPT]: friend(s)  Medications Reviewed Today: Medications Reviewed Today     Reviewed by Rumalda Alan PENNER, RN (Registered Nurse) on 08/10/24 at 1530  Med List Status: <None>   Medication Order Taking? Sig Documenting Provider Last Dose Status Informant  carvedilol  (COREG ) 25 MG tablet [id number] Yes Take 1 tablet (25 mg total) by mouth 2 (two) times daily. McGowen, Philip H, MD  Active   cloNIDine  (CATAPRES ) 0.1 MG tablet [id number]  TAKE 1/2 TABLET (0.05 MG TOTAL) BY MOUTH TWICE A DAY  Patient not taking: Reported on 08/10/2024   McGowen, Philip H, MD  Active   cyanocobalamin  (VITAMIN B12) 1000 MCG tablet [id number]  Take 1 tablet (1,000 mcg total) by mouth daily.  Patient not taking: Reported on 08/10/2024   McGowen, Philip H, MD  Active   folic acid  (FOLVITE ) 1 MG tablet [id number]  Take 1 tablet by mouth daily.  Patient not taking: Reported on 08/10/2024   [provider]  Active   hydrochlorothiazide  (MICROZIDE ) 12.5 MG capsule [id number]  Take 12.5 mg by mouth daily.  Patient not taking: Reported on 08/10/2024   [provider]  Active   irbesartan  (AVAPRO ) 150 MG tablet [id number] Yes Take 1 tablet (150 mg total) by mouth daily. McGowen, Philip H, MD   Active   Multiple Vitamin (MULTIVITAMIN ADULT PO) [id number] Yes Take by mouth daily. [provider]  Active   norethindrone  (MICRONOR ) 0.35 MG tablet [id number] Yes TAKE 1 TABLET BY MOUTH EVERY DAY McGowen, Aleene DEL, MD  Active   pantoprazole  (PROTONIX ) 40 MG tablet [id number] Yes Take 1 tablet (40 mg total) by mouth daily. McGowen, Philip H, MD  Active   sertraline  (ZOLOFT ) 100 MG tablet [id number] Yes Take 2 tablets (200 mg total) by mouth daily. McGowen, Philip H, MD  Active   sodium chloride  1 g tablet [id number]  Take 1 g by mouth 2 (two) times daily with a meal.  Patient not taking: Reported on 08/10/2024   [provider]  Active   spironolactone  (ALDACTONE ) 25 MG tablet [id number]  Take 1 tablet (25 mg total) by mouth daily.  Patient not taking: Reported on 08/10/2024   McGowen, Philip H, MD  Active   thiamine  (VITAMIN B1) 100 MG tablet [id number]  Take 1 tablet (100 mg total) by mouth daily.  Patient not taking: Reported on 08/10/2024   Candise Aleene DEL, MD  Active            Today's Vitals   08/10/24 1503  PainSc: 0-No pain     Home Care and Equipment/Supplies: Were Home Health Services Ordered?: No Any new equipment or medical supplies ordered?: No  Functional Questionnaire: Do you need assistance with bathing/showering or dressing?: No Do you need assistance with meal preparation?: No Do you need assistance with eating?: No Do you have difficulty maintaining continence: No  Do you need assistance with getting out of bed/getting out of a chair/moving?: No Do you have difficulty managing or taking your medications?: No  Follow up appointments reviewed: PCP Follow-up appointment confirmed?: Yes Date of PCP follow-up appointment?: 08/13/24 Follow-up Provider: PCP Specialist Hospital Follow-up appointment confirmed?: No Reason Specialist Follow-Up Not Confirmed: Patient has Specialist Provider Number and will Call for Appointment Do you need  transportation to your follow-up appointment?: No Do you understand care options if your condition(s) worsen?: Yes-patient verbalized understanding  SDOH Interventions Today    Flowsheet Row Most Recent Value  SDOH Interventions   Food Insecurity Interventions Intervention Not Indicated  Housing Interventions Intervention Not Indicated  Transportation Interventions Intervention Not Indicated  Utilities Interventions Intervention Not Indicated     Goals Addressed             This Visit's Progress    [facility] Transitions of Care (TOC) Care Plan       Problems:  Recent Hospitalization for treatment of Acute kidney failure and ETOH abuse Limited social support: does not have much support at home - depends on neighbors and friends  Medication management barrier - is not taking medications as prescribed.  No therapist or psychiatrist   Goal:  Over the next 30 days, the patient will not experience hospital readmission  Interventions:  Transitions of Care: Doctor Visits  - discussed the importance of doctor visits Reviewed all medications and reviewed with patient that she is not taking her medications as prescribed. Assessed last ETOH prior to admission Reviewed SDOH  Discussed anxiety and depression and patient is not taking her medications that are prescribed to help her Reviewed social support.  Assessed if patient is interested in talking to LCSW as previously engaged with Alm Armor.  Patient declined and states it was not much help. Reviewed importance of being ready for change and no one else can do it for her Reviewed elevated liver enzymes and reason for referral to GI. Reviewed problems associated with ETOH abuse ( liver failure, malnutrition, bleeding) Reviewed and offered 30 day TOC program and patient has consented.  Next appointment scheduled Provided my contact information.  Offered referral to LCSW and patient declined.   Patient Self Care Activities:  Attend  all scheduled provider appointments Call pharmacy for medication refills 3-7 days in advance of running out of medications Call provider office for new concerns or questions  Notify RN Care Manager of TOC call rescheduling needs Participate in Transition of Care Program/Attend TOC scheduled calls Take medications as prescribed   Talk to PCP about needed help with what meds to take and what not to take. Reviewed importance of patient to discuss with PCP about therapy and rehab.   Plan:  Telephone follow up appointment with care management team member scheduled for:  08/19/2024 at 0900 The patient has been provided with contact information for the care management team and has been advised to call with any health related questions or concerns.         Alan Ee, RN, BSN, CEN [employer]- Transition of Care Team.  [facility] [phone]

## 2024-08-13 ENCOUNTER — Ambulatory Visit: Admitting: Family Medicine

## 2024-08-19 ENCOUNTER — Telehealth: Payer: Self-pay

## 2024-09-22 DIAGNOSIS — N170 Acute kidney failure with tubular necrosis: Secondary | ICD-10-CM | POA: Diagnosis not present

## 2024-09-22 DIAGNOSIS — N3001 Acute cystitis with hematuria: Secondary | ICD-10-CM | POA: Diagnosis not present

## 2024-09-22 DIAGNOSIS — F17210 Nicotine dependence, cigarettes, uncomplicated: Secondary | ICD-10-CM | POA: Diagnosis not present

## 2024-09-22 DIAGNOSIS — K8581 Other acute pancreatitis with uninfected necrosis: Secondary | ICD-10-CM | POA: Diagnosis not present

## 2024-09-22 DIAGNOSIS — N179 Acute kidney failure, unspecified: Secondary | ICD-10-CM | POA: Diagnosis not present

## 2024-09-22 DIAGNOSIS — D6959 Other secondary thrombocytopenia: Secondary | ICD-10-CM | POA: Diagnosis not present

## 2024-09-22 DIAGNOSIS — E861 Hypovolemia: Secondary | ICD-10-CM | POA: Diagnosis not present

## 2024-09-22 DIAGNOSIS — K8591 Acute pancreatitis with uninfected necrosis, unspecified: Secondary | ICD-10-CM | POA: Diagnosis not present

## 2024-09-22 DIAGNOSIS — Z789 Other specified health status: Secondary | ICD-10-CM | POA: Diagnosis not present

## 2024-09-22 DIAGNOSIS — F419 Anxiety disorder, unspecified: Secondary | ICD-10-CM | POA: Diagnosis not present

## 2024-09-22 DIAGNOSIS — I10 Essential (primary) hypertension: Secondary | ICD-10-CM | POA: Diagnosis not present

## 2024-09-22 DIAGNOSIS — Z79899 Other long term (current) drug therapy: Secondary | ICD-10-CM | POA: Diagnosis not present

## 2024-09-22 DIAGNOSIS — R9431 Abnormal electrocardiogram [ECG] [EKG]: Secondary | ICD-10-CM | POA: Diagnosis not present

## 2024-09-22 DIAGNOSIS — K529 Noninfective gastroenteritis and colitis, unspecified: Secondary | ICD-10-CM | POA: Diagnosis not present

## 2024-09-22 DIAGNOSIS — E860 Dehydration: Secondary | ICD-10-CM | POA: Diagnosis not present

## 2024-09-22 DIAGNOSIS — E876 Hypokalemia: Secondary | ICD-10-CM | POA: Diagnosis not present

## 2024-09-22 DIAGNOSIS — K219 Gastro-esophageal reflux disease without esophagitis: Secondary | ICD-10-CM | POA: Diagnosis not present

## 2024-09-22 DIAGNOSIS — K8521 Alcohol induced acute pancreatitis with uninfected necrosis: Secondary | ICD-10-CM | POA: Diagnosis not present

## 2024-09-22 DIAGNOSIS — Z882 Allergy status to sulfonamides status: Secondary | ICD-10-CM | POA: Diagnosis not present

## 2024-09-22 DIAGNOSIS — I509 Heart failure, unspecified: Secondary | ICD-10-CM | POA: Diagnosis not present

## 2024-09-22 DIAGNOSIS — I959 Hypotension, unspecified: Secondary | ICD-10-CM | POA: Diagnosis not present

## 2024-09-22 DIAGNOSIS — R748 Abnormal levels of other serum enzymes: Secondary | ICD-10-CM | POA: Diagnosis not present

## 2024-09-22 DIAGNOSIS — D539 Nutritional anemia, unspecified: Secondary | ICD-10-CM | POA: Diagnosis not present

## 2024-09-22 DIAGNOSIS — J90 Pleural effusion, not elsewhere classified: Secondary | ICD-10-CM | POA: Diagnosis not present

## 2024-09-22 DIAGNOSIS — R918 Other nonspecific abnormal finding of lung field: Secondary | ICD-10-CM | POA: Diagnosis not present

## 2024-09-22 DIAGNOSIS — F1010 Alcohol abuse, uncomplicated: Secondary | ICD-10-CM | POA: Diagnosis not present

## 2024-09-22 DIAGNOSIS — R059 Cough, unspecified: Secondary | ICD-10-CM | POA: Diagnosis not present

## 2024-09-22 DIAGNOSIS — E871 Hypo-osmolality and hyponatremia: Secondary | ICD-10-CM | POA: Diagnosis not present

## 2024-09-27 ENCOUNTER — Telehealth: Payer: Self-pay

## 2024-09-27 NOTE — Transitions of Care (Post Inpatient/ED Visit) (Unsigned)
° °  09/27/2024  Name: ASEEL TRUXILLO MRN: [id number] DOB: 1976-01-06  Today's TOC FU Call Status: Today's TOC FU Call Status:: Unsuccessful Call (1st Attempt) Unsuccessful Call (1st Attempt) Date: 09/27/24  Attempted to reach the patient regarding the most recent Inpatient/ED visit.  Follow Up Plan: Additional outreach attempts will be made to reach the patient to complete the Transitions of Care (Post Inpatient/ED visit) call.   Signature  Charmaine Bloodgood, LPN [facility] Advisor [facility] You Are. We Are. [facility] Direct Dial [phone]

## 2024-09-29 NOTE — Transitions of Care (Post Inpatient/ED Visit) (Signed)
° °  09/29/2024  Name: Michelle Myers MRN: [id number] DOB: 1975-10-20  Today's TOC FU Call Status: Today's TOC FU Call Status:: Successful TOC FU Call Completed Unsuccessful Call (1st Attempt) Date: 09/27/24 [facility] FU Call Complete Date: 09/29/24  Attempted to reach the patient regarding the most recent Inpatient/ED visit.  Follow Up Plan: No further outreach attempts will be made at this time. We have been unable to contact the patient. Patient still in hospital Signature Julian Lemmings, LPN [facility] Nurse Health Advisor Direct Dial [phone]

## 2024-10-01 LAB — HEPATIC FUNCTION PANEL
ALT: 26 U/L (ref 7–35)
AST: 27 (ref 13–35)
Alkaline Phosphatase: 127 — AB (ref 25–125)
Bilirubin, Total: 0.4

## 2024-10-01 LAB — IRON,TIBC AND FERRITIN PANEL
%SAT: 25
Ferritin: 3113
Iron: 24.2
TIBC: 95
UIBC: 71

## 2024-10-01 LAB — COMPREHENSIVE METABOLIC PANEL WITH GFR
Albumin: 2.5 — AB (ref 3.5–5.0)
Calcium: 8.5 — AB (ref 8.7–10.7)
Globulin: 2.8
eGFR: 22

## 2024-10-01 LAB — BASIC METABOLIC PANEL WITH GFR
BUN: 39 — AB (ref 4–21)
CO2: 22 (ref 13–22)
Chloride: 106 (ref 99–108)
Creatinine: 2.6 — AB (ref 0.5–1.1)
Glucose: 95
Potassium: 3.5 meq/L (ref 3.5–5.1)
Sodium: 139 (ref 137–147)

## 2024-10-01 LAB — CBC AND DIFFERENTIAL
HCT: 23 — AB (ref 36–46)
Hemoglobin: 7.7 — AB (ref 12.0–16.0)
Platelets: 420 K/uL — AB (ref 150–400)
WBC: 11.6

## 2024-10-01 LAB — CBC: RBC: 2.23 — AB (ref 3.87–5.11)

## 2024-10-11 ENCOUNTER — Ambulatory Visit: Payer: Self-pay

## 2024-10-11 NOTE — Telephone Encounter (Signed)
 FYI Only or Action Required?: FYI only for provider: appointment scheduled on 10/13/24.  Patient was last seen in primary care on 05/12/2024 by McGowen, Aleene DEL, MD.  Called Nurse Triage reporting Vaginal Itching.  Symptoms began several days ago.  Interventions attempted: Nothing.  Symptoms are: unchanged.  Triage Disposition: See PCP When Office is Open (Within 3 Days)  Patient/caregiver understands and will follow disposition?:   Reason for Disposition  [1] Vaginal itching AND [2] not improved > 3 days following Care Advice  Answer Assessment - Initial Assessment Questions Patient calling to schedule follow-up visit with PCP post hospital stay. Admitted 09/22/24, states catheter came out 7 days ago. Pt states she is having some itching in her vaginal area. Denies burning, odor, and pain.   Patient scheduled with PCP 10/13/24.  1. SYMPTOM: What's the main symptom you're concerned about? (e.g., pain, itching, dryness)     Itching  2. LOCATION: Where is the  itching located? (e.g., inside/outside, left/right)     Inside/outside 3. ONSET: When did the  itching  start?     Few days ago 4. PAIN: Is there any pain? If Yes, ask: How bad is it? (Scale: 1-10; mild, moderate, severe)     Denies  5. ITCHING: Is there any itching? If Yes, ask: How bad is it? (Scale: 1-10; mild, moderate, severe)     Mild itching 6. CAUSE: What do you think is causing the discharge? Have you had the same problem before? What happened then?     Pt unsure  7. OTHER SYMPTOMS: Do you have any other symptoms? (e.g., fever, itching, vaginal bleeding, pain with urination, injury to genital area, vaginal foreign body)     Denies  Protocols used: Vaginal Symptoms-A-AH  Copied from CRM #[id number]. Topic: Clinical - Red Word Triage >> Oct 11, 2024  3:44 PM Eva FALCON wrote: Red Word that prompted transfer to Nurse Triage:  was in hospital for 8 days but is now having itching, burning in vaginal  area, trouble walking.

## 2024-10-12 NOTE — Progress Notes (Signed)
 No Show

## 2024-10-13 ENCOUNTER — Encounter: Admitting: Family Medicine

## 2024-10-13 ENCOUNTER — Encounter: Payer: Self-pay | Admitting: Sports Medicine

## 2024-10-13 ENCOUNTER — Ambulatory Visit: Admitting: Sports Medicine

## 2024-10-13 VITALS — BP 136/96 | HR 92 | Temp 98.1°F | Wt 134.0 lb

## 2024-10-13 DIAGNOSIS — N179 Acute kidney failure, unspecified: Secondary | ICD-10-CM

## 2024-10-13 DIAGNOSIS — R770 Abnormality of albumin: Secondary | ICD-10-CM

## 2024-10-13 DIAGNOSIS — R197 Diarrhea, unspecified: Secondary | ICD-10-CM

## 2024-10-13 DIAGNOSIS — Z8719 Personal history of other diseases of the digestive system: Secondary | ICD-10-CM | POA: Diagnosis not present

## 2024-10-13 DIAGNOSIS — J029 Acute pharyngitis, unspecified: Secondary | ICD-10-CM

## 2024-10-13 DIAGNOSIS — D649 Anemia, unspecified: Secondary | ICD-10-CM

## 2024-10-13 DIAGNOSIS — F10220 Alcohol dependence with intoxication, uncomplicated: Secondary | ICD-10-CM

## 2024-10-13 DIAGNOSIS — F1020 Alcohol dependence, uncomplicated: Secondary | ICD-10-CM

## 2024-10-13 DIAGNOSIS — R252 Cramp and spasm: Secondary | ICD-10-CM

## 2024-10-13 DIAGNOSIS — N898 Other specified noninflammatory disorders of vagina: Secondary | ICD-10-CM | POA: Diagnosis not present

## 2024-10-13 DIAGNOSIS — D696 Thrombocytopenia, unspecified: Secondary | ICD-10-CM

## 2024-10-13 DIAGNOSIS — R7401 Elevation of levels of liver transaminase levels: Secondary | ICD-10-CM

## 2024-10-13 LAB — POCT INFLUENZA A/B
Influenza A, POC: NEGATIVE
Influenza B, POC: NEGATIVE

## 2024-10-13 LAB — POCT RAPID STREP A (OFFICE): Rapid Strep A Screen: NEGATIVE

## 2024-10-13 LAB — POC COVID19 BINAXNOW: SARS Coronavirus 2 Ag: NEGATIVE

## 2024-10-13 MED ORDER — FLUCONAZOLE 150 MG PO TABS: 150.0000 mg | ORAL_TABLET | Freq: Once | ORAL | 0 refills | Status: AC

## 2024-10-13 MED ORDER — EFFER-K 20 MEQ PO TBEF: 20.0000 meq | EFFERVESCENT_TABLET | Freq: Every day | ORAL | Status: AC

## 2024-10-13 NOTE — Assessment & Plan Note (Signed)
 Missed nephroloy appt Instructed to reschedule Orders:   Comp Met (CMET)

## 2024-10-13 NOTE — Telephone Encounter (Signed)
 PAS states patient received a voicemail from the office that was cut off so she was unable to hear the full message or details about why the clinic was calling. PAS states attempt made to call [facility] and no answer. No documentation in chart showing calls to patient or reason for call. Per chart review patient was seen this morning with PCP. Patient disconnected prior to warm transfer to RN. Attempted outbound call to patient and no answer, unable to leave voicemail.

## 2024-10-13 NOTE — Telephone Encounter (Signed)
 Pt currently in office for evaluation

## 2024-10-13 NOTE — Progress Notes (Signed)
 "  Careteam: Patient Care Team: Candise Aleene DEL, MD as PCP - General (Family Medicine) Mat Browning, MD as Consulting Physician (Obstetrics and Gynecology) Tess Agent, MD as Consulting Physician (Sports Medicine) Dohmeier, Dedra, MD as Consulting Physician (Neurology)  Allergies[1]  Chief Complaint  Patient presents with   Vaginal Itching    Pt was just dismissed from the [facility] and had a cathter. She is currently having some vaginal itching and discharge.    Discussed the use of AI scribe software for clinical note transcription with the patient, who gave verbal consent to proceed.  History of Present Illness  Michelle Myers is a 48 year old female who presents with vaginal itching and sore throat.  She reports an itchy sensation in her vaginal area, which she associates with the removal of a catheter during her hospital stay. She denies any burning sensation typical of a urinary tract infection and describes a clear, wet discharge without any unusual color. No pain in her lower abdomen and no fever.  She experiences uncontrollable bowel movements, described as diarrhea, every time she urinates. This symptom began after her recent hospital discharge and is a new development, although she has a history of bowel issues. She has approximately five bowel movements per day, which she denies as fowl smelling. No abdominal pain, nausea, or vomiting.    No chest pain, palpitations, blurred vision, dizziness, or lightheadedness. She has a history of neuropathy in her feet but reports no new symptoms.   She has a history of alcohol  use and mentions drinking alcohol  as recently as yesterday. She is taking thiamine , multivitamins, and folic acid , She is currently on amlodipine  and Coreg  and has stopped taking ibuprofen .    She reports a sore throat that began on Sunday morning, with no associated runny nose, postnasal drip, sinus pain, ear pain, cough, or fever. No nausea, vomiting,  increased sweating, anxiety, agitation, or hallucinations.     As per d/c summary    Hospital Course by Problem List     '' Patient is a 48 year old female, with a PMH of Alcohol  Abuse and HTN, who presents to the hospital for nausea and vomiting. Patent reported she developed severe nausea and vomiting for the last 2-3 days. No abdominal pain. Patient began to feel lightheaded and weak on 09-22-2024 prompting her to seek medical care. She reported consuming 4 box wines a day with last drink 72 hours prior to presentation. HR 116, BP 108/80, afebrile, ill-appearing, sats 91% RA, holding ice on forehead, clammy. Creatinine 4.15, GFR 13 and severe dehydration. Lipase over 3000 and CT AP consistent with acute pancreatitis.    Acute alcoholic pancreatitis Patient initially made NPO, given pain control, anti-emetics and IVF. Patient advanced diets with improvement in nausea and vomiting. By day of discharge was tolerating normal diet with no pain, nausea, or vomiting.    Acute renal failure and acute cystitis with hematuria Patient admitted with elevated creatinine then had decreased urine output despite fluids. Started on antibiotics for cystitis. Urine culture with no growth. She did complete 5-day course of empiric Rocephin. Creatinine peaked at 6.8 and nephrology was consulted. Renal ultrasound was unremarkable with no hydronephrosis. Nephrology felt AKI most likely due to ATN in the setting of severe volume depletion. Recommended following renal indices. With time, patient did start to make urine and had good output with improvement in kidney function. On day of discharge creatinine was down to 2.60 and patient scheduled to have appointment with nephrology  in 10 days. Patient also recommended to have follow up with PCP as soon as possible after DC. She planned to call and make appointment.    Alcohol  use disorder Patient placed on CIWA with thiamine  and folic acid  supplementation. Transaminitis  likely due to alcohol  use; LFTs normalized by day of discharge. Per documentation it does not appear that the patient had significant withdrawal symptoms and did not require Ativan . She was counseled on etoh cessation. She was prescribed Naltrexone on DC (had no opiods for >7 days, recommended to wait 3 more days before starting.) She planned to start back at [employer] ''    Review of Systems:  Review of Systems  Constitutional:  Negative for chills and fever.  HENT:  Positive for sore throat. Negative for congestion, ear pain and sinus pain.   Eyes:  Negative for blurred vision.  Respiratory:  Negative for cough, sputum production and shortness of breath.   Cardiovascular:  Negative for chest pain, palpitations and leg swelling.  Gastrointestinal:  Positive for diarrhea. Negative for abdominal pain, nausea and vomiting.  Genitourinary:  Negative for dysuria and hematuria.       Vaginal itching  Musculoskeletal:  Positive for myalgias.  Neurological:  Negative for dizziness.   Negative unless indicated in HPI.   Patient Active Problem List   Diagnosis Date Noted   AKI (acute kidney injury) 08/04/2024   Hypersomnia due to alcohol  (HCC) 05/19/2023   Tremor due to substance abuse 05/19/2023   Snoring 05/19/2023   Hyperreflexia 05/19/2023   Abnormal cervical Papanicolaou smear 02/04/2020   GERD (gastroesophageal reflux disease) 02/04/2020   Hyponatremia 05/18/2019   ETOH abuse    Syncope    Abnormal LFTs    Abdominal pain    Depression    Hypomagnesemia    Insomnia 01/22/2016   Elevated transaminase level 01/02/2016   UTI (urinary tract infection) 10/14/2015   Chest pain 09/11/2015   RHINITIS MEDICAMENTOSA 03/20/2010   Anxiety state 03/08/2010   TOBACCO USE 03/08/2010   Essential hypertension 03/08/2010   IBS 03/08/2010   Past Medical History:  Diagnosis Date   Alcohol  withdrawal seizure (HCC)    02/21/24   Alcoholic peripheral neuropathy    Alcoholism (HCC)    Ongoing  (many years) of abuse until 05/17/2019 admission for detox.   Anxiety    Elevated transaminase level    likely secondary to alcohol .  Viral Hep screens normal.   Hypertension    IFG (impaired fasting glucose)    08/2022 fasting 109.  Hba1c 5.6%   Magnesium  deficiency    Rotator cuff tendonitis, right 2021   recalcitrant ->MRI 12/2019--tiny low grade partial thickness bursal region teres minor tear   Thiamin deficiency    Tobacco dependence    quit 2024   Past Surgical History:  Procedure Laterality Date   BREAST BIOPSY Right 07/2018   benign (fibroadenoma)   CATARACT EXTRACTION W/ INTRAOCULAR LENS  IMPLANT, BILATERAL  2008 and 2010   CERVICAL CONE BIOPSY     for HPV   MOUTH SURGERY  2022   WISDOM TOOTH EXTRACTION     Social History[2] Family History  Problem Relation Age of Onset   Prostate cancer Father    Hypertension Father    Brain cancer Daughter        neurofibromatosis, optic neurogliomas   Diabetes Maternal Uncle    Heart disease Maternal Grandmother        CBAG in late 70s   Hypertension Maternal Grandmother  Heart attack Maternal Grandfather 84   Arthritis Mother    Hypertension Mother    Alcohol  abuse Brother        half brother, maternal   Mental illness Paternal Grandmother    Heart disease Paternal Grandfather    Heart attack Brother 46       half brother, maternal   Stroke Neg Hx    Breast cancer Neg Hx    Allergies[3]  Medications: Patient's Medications  New Prescriptions   FLUCONAZOLE (DIFLUCAN) 150 MG TABLET    Take 1 tablet (150 mg total) by mouth once for 1 dose.   POTASSIUM BICARB-CITRIC ACID (EFFER-K) 20 MEQ TBEF    Take 1 tablet (20 mEq total) by mouth daily.  Previous Medications   AMLODIPINE  (NORVASC ) 5 MG TABLET    Take 5 mg by mouth daily.   CARVEDILOL  (COREG ) 25 MG TABLET    Take 1 tablet (25 mg total) by mouth 2 (two) times daily.   CYANOCOBALAMIN  (VITAMIN B12) 1000 MCG TABLET    Take 1 tablet (1,000 mcg total) by mouth daily.    FOLIC ACID  (FOLVITE ) 1 MG TABLET    Take 1 tablet by mouth daily.   MULTIPLE VITAMIN (MULTIVITAMIN ADULT PO)    Take by mouth daily.   NALTREXONE (DEPADE) 50 MG TABLET    Take 50 mg by mouth daily.   NORETHINDRONE  (MICRONOR ) 0.35 MG TABLET    TAKE 1 TABLET BY MOUTH EVERY DAY   PANTOPRAZOLE  (PROTONIX ) 40 MG TABLET    Take 1 tablet (40 mg total) by mouth daily.   SERTRALINE  (ZOLOFT ) 100 MG TABLET    Take 2 tablets (200 mg total) by mouth daily.   THIAMINE  (VITAMIN B1) 100 MG TABLET    Take 1 tablet (100 mg total) by mouth daily.  Modified Medications   No medications on file  Discontinued Medications   CLONIDINE  (CATAPRES ) 0.1 MG TABLET    TAKE 1/2 TABLET (0.05 MG TOTAL) BY MOUTH TWICE A DAY   HYDROCHLOROTHIAZIDE  (MICROZIDE ) 12.5 MG CAPSULE    Take 12.5 mg by mouth daily.   IRBESARTAN  (AVAPRO ) 150 MG TABLET    Take 1 tablet (150 mg total) by mouth daily.   SODIUM CHLORIDE  1 G TABLET    Take 1 g by mouth 2 (two) times daily with a meal.   SPIRONOLACTONE  (ALDACTONE ) 25 MG TABLET    Take 1 tablet (25 mg total) by mouth daily.    Physical Exam: Vitals:   10/13/24 0811 10/13/24 0839  BP: (!) 142/100 (!) 136/96  Pulse: 92   Temp: 98.1 F (36.7 C)   TempSrc: Oral   SpO2: 100%   Weight: 134 lb (60.8 kg)    Body mass index is 21.63 kg/m. BP Readings from Last 3 Encounters:  10/13/24 (!) 136/96  05/12/24 111/76  04/05/24 108/75   Wt Readings from Last 3 Encounters:  10/13/24 134 lb (60.8 kg)  05/12/24 153 lb 9.6 oz (69.7 kg)  04/05/24 154 lb 6.4 oz (70 kg)    Physical Exam Constitutional:      Appearance: Normal appearance.  HENT:     Head: Normocephalic and atraumatic.     Right Ear: Tympanic membrane normal.     Left Ear: Tympanic membrane normal.     Mouth/Throat:     Pharynx: Posterior oropharyngeal erythema present. No oropharyngeal exudate.  Cardiovascular:     Rate and Rhythm: Normal rate and regular rhythm.  Pulmonary:     Effort: Pulmonary effort is normal.  No  respiratory distress.     Breath sounds: Normal breath sounds. No wheezing.  Abdominal:     General: Bowel sounds are normal. There is no distension.     Tenderness: There is no abdominal tenderness. There is no guarding or rebound.     Comments:    Musculoskeletal:        General: No swelling or tenderness.  Neurological:     Mental Status: She is alert. Mental status is at baseline.     Sensory: No sensory deficit.     Motor: No weakness.     Comments: Mild tremors     Labs reviewed: Basic Metabolic Panel: Recent Labs    01/09/24 0814 02/27/24 1446 04/05/24 1513 10/01/24 0000  NA 136 139 136 139  K 4.8 3.9 4.2 3.5  CL 100 104 101 106  CO2 25 28 27 22   GLUCOSE 95 96 103*  --   BUN 6 11 10  39*  CREATININE 0.55 0.60 0.46 2.6*  CALCIUM 9.1 9.0 9.5 8.5*  MG  --  1.5  --   --   TSH 2.53  --   --   --    Liver Function Tests: Recent Labs    01/09/24 0814 02/27/24 1446 10/01/24 0000  AST 115* 15 27  ALT 35 14 26  ALKPHOS 70  --  127*  BILITOT 0.7 0.3  --   PROT 7.3 6.5  --   ALBUMIN 4.3  --  2.5*   No results for input(s): LIPASE, AMYLASE in the last 8760 hours. No results for input(s): AMMONIA in the last 8760 hours. CBC: Recent Labs    01/09/24 0814 10/01/24 0000  WBC 5.5 11.6  NEUTROABS 3.9  --   HGB 12.2 7.7*  HCT 37.2 23*  MCV 109.1*  --   PLT 207.0 420*   Lipid Panel: Recent Labs    01/09/24 0814  CHOL 192  HDL 110.70  LDLCALC 66  TRIG 72.0  CHOLHDL 2   TSH: Recent Labs    01/09/24 0814  TSH 2.53   A1C: Lab Results  Component Value Date   HGBA1C 4.9 01/09/2024    Assessment & Plan Vaginal itching Pt c/o vaginal itching Denies dysuria, lower abdominal pain Could not give urine sample Will send fluconazole to her pharmay Orders:   NuSwab Vaginitis Plus (VG+)   POCT Urinalysis Dipstick (Automated)   fluconazole (DIFLUCAN) 150 MG tablet; Take 1 tablet (150 mg total) by mouth once for 1 dose.  Diarrhea, unspecified  type Take imodium Orders:   CBC with Differential/Platelet   Comp Met (CMET)  History of acute pancreatitis Denies abdominal pain, nausea, vomiting    Alcoholism (HCC) Started drinking back  CIWA score 2 Will check cmp Orders:   Comp Met (CMET)  Sore throat Flu/covid/ strep neg Will isntruct the patient to take cephcol lozenges  Orders:   POC COVID-19 BinaxNow   POCT Influenza A/B   POCT rapid strep A  AKI (acute kidney injury) Missed nephroloy appt Instructed to reschedule Orders:   Comp Met (CMET)  Muscle cramps Will check magnesium  levels Orders:   Magnesium    Return in about 2 weeks (around 10/27/2024).:   Michelle Myers  40 minTotal time spent for obtaining history,  performing a medically appropriate examination and evaluation, reviewing the tests,ordering  tests,  documenting clinical information in the electronic or other health record care coordination (not separately reported)      [1]  Allergies Allergen Reactions  Lisinopril      Angioedema   Epinephrine Other (See Comments)    Tachycardia, dizziness   Sulfa Antibiotics      ? reaction  [2]  Social History Tobacco Use   Smoking status: Every Day    Current packs/day: 0.75    Average packs/day: 0.8 packs/day for 24.0 years (18.0 ttl pk-yrs)    Types: Cigarettes   Smokeless tobacco: Never   Tobacco comments:    smoked 1992-present , up to 1.5 ppd  Vaping Use   Vaping status: Never Used  Substance Use Topics   Alcohol  use: Not Currently    Alcohol /week: 0.0 standard drinks of alcohol    Drug use: No  [3]  Allergies Allergen Reactions   Lisinopril      Angioedema   Epinephrine Other (See Comments)    Tachycardia, dizziness   Sulfa Antibiotics      ? reaction   "

## 2024-10-14 LAB — CBC WITH DIFFERENTIAL/PLATELET
Absolute Lymphocytes: 1459 {cells}/uL (ref 850–3900)
Absolute Monocytes: 816 {cells}/uL (ref 200–950)
Basophils Absolute: 67 {cells}/uL (ref 0–200)
Basophils Relative: 0.7 %
Eosinophils Absolute: 403 {cells}/uL (ref 15–500)
Eosinophils Relative: 4.2 %
HCT: 31.6 % — ABNORMAL LOW (ref 35.9–46.0)
Hemoglobin: 10.5 g/dL — ABNORMAL LOW (ref 11.7–15.5)
MCH: 34.5 pg — ABNORMAL HIGH (ref 27.0–33.0)
MCHC: 33.2 g/dL (ref 31.6–35.4)
MCV: 103.9 fL — ABNORMAL HIGH (ref 81.4–101.7)
MPV: 10.5 fL (ref 7.5–12.5)
Monocytes Relative: 8.5 %
Neutro Abs: 6854 {cells}/uL (ref 1500–7800)
Neutrophils Relative %: 71.4 %
Platelets: 442 Thousand/uL — ABNORMAL HIGH (ref 140–400)
RBC: 3.04 Million/uL — ABNORMAL LOW (ref 3.80–5.10)
RDW: 13.6 % (ref 11.0–15.0)
Total Lymphocyte: 15.2 %
WBC: 9.6 Thousand/uL (ref 3.8–10.8)

## 2024-10-14 LAB — COMPREHENSIVE METABOLIC PANEL WITH GFR
AG Ratio: 1.3 (calc) (ref 1.0–2.5)
ALT: 21 U/L (ref 6–29)
AST: 34 U/L (ref 10–35)
Albumin: 4 g/dL (ref 3.6–5.1)
Alkaline phosphatase (APISO): 120 U/L (ref 31–125)
BUN/Creatinine Ratio: 10 (calc) (ref 6–22)
BUN: 5 mg/dL — ABNORMAL LOW (ref 7–25)
CO2: 25 mmol/L (ref 20–32)
Calcium: 9.4 mg/dL (ref 8.6–10.2)
Chloride: 107 mmol/L (ref 98–110)
Creat: 0.51 mg/dL (ref 0.50–0.99)
Globulin: 3.1 g/dL (ref 1.9–3.7)
Glucose, Bld: 136 mg/dL — ABNORMAL HIGH (ref 65–99)
Potassium: 4.9 mmol/L (ref 3.5–5.3)
Sodium: 142 mmol/L (ref 135–146)
Total Bilirubin: 0.5 mg/dL (ref 0.2–1.2)
Total Protein: 7.1 g/dL (ref 6.1–8.1)
eGFR: 115 mL/min/1.73m2

## 2024-10-14 LAB — MAGNESIUM: Magnesium: 1.1 mg/dL — ABNORMAL LOW (ref 1.5–2.5)

## 2024-10-15 ENCOUNTER — Ambulatory Visit: Payer: Self-pay | Admitting: Sports Medicine

## 2024-10-15 MED ORDER — MAGNESIUM OXIDE -MG SUPPLEMENT 400 (240 MG) MG PO TABS: 400.0000 mg | ORAL_TABLET | Freq: Every day | ORAL | 0 refills | Status: AC

## 2024-10-18 LAB — NUSWAB VAGINITIS PLUS (VG+)
Candida albicans, NAA: POSITIVE — AB
Candida glabrata, NAA: POSITIVE — AB

## 2024-11-06 ENCOUNTER — Other Ambulatory Visit: Payer: Self-pay | Admitting: Family Medicine

## 2024-11-08 ENCOUNTER — Other Ambulatory Visit: Payer: Self-pay | Admitting: Family Medicine

## 2024-11-16 ENCOUNTER — Other Ambulatory Visit: Payer: Self-pay | Admitting: Family Medicine

## 2024-12-01 ENCOUNTER — Inpatient Hospital Stay: Admitting: Family Medicine
